# Patient Record
Sex: Female | Born: 1975 | Race: White | Hispanic: No | Marital: Married | Smoking: Former smoker
Health system: Southern US, Community
[De-identification: ages and names within clinical notes are randomized; demographics above are authoritative.]

## PROBLEM LIST (undated history)

## (undated) DIAGNOSIS — IMO0001 Reserved for inherently not codable concepts without codable children: Secondary | ICD-10-CM

## (undated) DIAGNOSIS — N888 Other specified noninflammatory disorders of cervix uteri: Secondary | ICD-10-CM

## (undated) DIAGNOSIS — K5792 Diverticulitis of intestine, part unspecified, without perforation or abscess without bleeding: Secondary | ICD-10-CM

## (undated) DIAGNOSIS — R160 Hepatomegaly, not elsewhere classified: Secondary | ICD-10-CM

## (undated) DIAGNOSIS — K3184 Gastroparesis: Secondary | ICD-10-CM

## (undated) DIAGNOSIS — T7840XA Allergy, unspecified, initial encounter: Secondary | ICD-10-CM

## (undated) DIAGNOSIS — R569 Unspecified convulsions: Secondary | ICD-10-CM

## (undated) DIAGNOSIS — Z5189 Encounter for other specified aftercare: Secondary | ICD-10-CM

## (undated) DIAGNOSIS — K760 Fatty (change of) liver, not elsewhere classified: Secondary | ICD-10-CM

## (undated) DIAGNOSIS — Z8742 Personal history of other diseases of the female genital tract: Secondary | ICD-10-CM

## (undated) DIAGNOSIS — G8929 Other chronic pain: Secondary | ICD-10-CM

## (undated) DIAGNOSIS — M419 Scoliosis, unspecified: Secondary | ICD-10-CM

## (undated) DIAGNOSIS — F329 Major depressive disorder, single episode, unspecified: Secondary | ICD-10-CM

## (undated) DIAGNOSIS — K449 Diaphragmatic hernia without obstruction or gangrene: Secondary | ICD-10-CM

## (undated) DIAGNOSIS — Z800 Family history of malignant neoplasm of digestive organs: Secondary | ICD-10-CM

## (undated) DIAGNOSIS — G4730 Sleep apnea, unspecified: Secondary | ICD-10-CM

## (undated) DIAGNOSIS — K219 Gastro-esophageal reflux disease without esophagitis: Secondary | ICD-10-CM

## (undated) DIAGNOSIS — F419 Anxiety disorder, unspecified: Secondary | ICD-10-CM

## (undated) HISTORY — DX: Allergy, unspecified, initial encounter: T78.40XA

## (undated) HISTORY — DX: Unspecified convulsions: R56.9

## (undated) HISTORY — PX: INNER EAR SURGERY: SHX679

## (undated) HISTORY — PX: WISDOM TOOTH EXTRACTION: SHX21

## (undated) HISTORY — DX: Gastroparesis: K31.84

## (undated) HISTORY — PX: TONSILLECTOMY: SUR1361

## (undated) HISTORY — PX: TUBAL LIGATION: SHX77

## (undated) HISTORY — DX: Sleep apnea, unspecified: G47.30

## (undated) HISTORY — DX: Reserved for inherently not codable concepts without codable children: IMO0001

## (undated) HISTORY — DX: Family history of malignant neoplasm of digestive organs: Z80.0

## (undated) HISTORY — DX: Encounter for other specified aftercare: Z51.89

## (undated) HISTORY — DX: Scoliosis, unspecified: M41.9

## (undated) HISTORY — DX: Fatty (change of) liver, not elsewhere classified: K76.0

## (undated) HISTORY — PX: COLONOSCOPY: SHX174

## (undated) HISTORY — DX: Personal history of other diseases of the female genital tract: Z87.42

## (undated) HISTORY — DX: Hepatomegaly, not elsewhere classified: R16.0

## (undated) HISTORY — PX: KNEE SURGERY: SHX244

## (undated) HISTORY — PX: OTHER SURGICAL HISTORY: SHX169

## (undated) HISTORY — DX: Diaphragmatic hernia without obstruction or gangrene: K44.9

## (undated) SURGERY — Surgical Case
Anesthesia: *Unknown

## (undated) NOTE — Telephone Encounter (Signed)
Formatting of this note might be different from the original.  [facility] Requested refill.   Electronically signed by May, Anita A, CMA at 08/06/2022 11:28 AM EST

---
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
## 1998-08-02 HISTORY — PX: WISDOM TOOTH EXTRACTION: SHX21

## 2005-06-02 ENCOUNTER — Other Ambulatory Visit: Admission: RE | Admit: 2005-06-02 | Discharge: 2005-06-02 | Payer: Self-pay | Admitting: Obstetrics and Gynecology

## 2005-11-29 ENCOUNTER — Inpatient Hospital Stay (HOSPITAL_COMMUNITY): Admission: AD | Admit: 2005-11-29 | Discharge: 2005-12-01 | Payer: Self-pay | Admitting: Obstetrics and Gynecology

## 2005-11-30 ENCOUNTER — Encounter (INDEPENDENT_AMBULATORY_CARE_PROVIDER_SITE_OTHER): Payer: Self-pay | Admitting: *Deleted

## 2006-08-02 HISTORY — PX: OTHER SURGICAL HISTORY: SHX169

## 2007-06-21 ENCOUNTER — Emergency Department (HOSPITAL_COMMUNITY): Admission: EM | Admit: 2007-06-21 | Discharge: 2007-06-22 | Payer: Self-pay | Admitting: Emergency Medicine

## 2008-10-30 ENCOUNTER — Emergency Department (HOSPITAL_COMMUNITY): Admission: EM | Admit: 2008-10-30 | Discharge: 2008-10-30 | Payer: Self-pay | Admitting: Emergency Medicine

## 2010-06-18 ENCOUNTER — Encounter: Admission: RE | Admit: 2010-06-18 | Discharge: 2010-06-18 | Payer: Self-pay | Admitting: Internal Medicine

## 2010-08-02 HISTORY — PX: CHOLECYSTECTOMY: SHX55

## 2010-08-17 LAB — COMPREHENSIVE METABOLIC PANEL
ALT: 36 U/L — ABNORMAL HIGH (ref 0–35)
AST: 29 U/L (ref 0–37)
Albumin: 3.7 g/dL (ref 3.5–5.2)
Alkaline Phosphatase: 57 U/L (ref 39–117)
BUN: 9 mg/dL (ref 6–23)
CO2: 27 mEq/L (ref 19–32)
Calcium: 9.2 mg/dL (ref 8.4–10.5)
Chloride: 105 mEq/L (ref 96–112)
Creatinine, Ser: 0.82 mg/dL (ref 0.4–1.2)
GFR calc Af Amer: >60 mL/min (ref >60)
GFR calc non Af Amer: >60 mL/min (ref >60)
Glucose, Bld: 84 mg/dL (ref 70–99)
Potassium: 4.1 mEq/L (ref 3.5–5.1)
Sodium: 140 mEq/L (ref 135–145)
Total Bilirubin: 0.6 mg/dL (ref 0.3–1.2)
Total Protein: 7.2 g/dL (ref 6.0–8.3)

## 2010-08-17 LAB — PREGNANCY, URINE: Preg Test, Ur: NEGATIVE

## 2010-08-17 LAB — SURGICAL PCR SCREEN
MRSA, PCR: NEGATIVE
Staphylococcus aureus: NEGATIVE

## 2010-08-20 ENCOUNTER — Ambulatory Visit (HOSPITAL_COMMUNITY)
Admission: RE | Admit: 2010-08-20 | Discharge: 2010-08-22 | Payer: Self-pay | Source: Home / Self Care | Attending: General Surgery | Admitting: General Surgery

## 2010-08-20 ENCOUNTER — Encounter (INDEPENDENT_AMBULATORY_CARE_PROVIDER_SITE_OTHER): Payer: Self-pay | Admitting: General Surgery

## 2010-08-21 NOTE — Op Note (Signed)
Meghan, Wilcox               ACCOUNT NO.:  [id number]  MEDICAL RECORD NO.:  [id number]          PATIENT TYPE:  AMB  LOCATION:  DAY                          FACILITY:  [facility]  PHYSICIAN:  Mare Loan, MD      DATE OF BIRTH:  12-23-1975  DATE OF PROCEDURE:  08/20/2010 DATE OF DISCHARGE:                              OPERATIVE REPORT   PREOPERATIVE DIAGNOSES: 1. Biliary colic. 2. Family history of liver disease.  POSTOPERATIVE DIAGNOSIS:  Biliary colic.  PROCEDURE:  Laparoscopic cholecystectomy, attempted cholangiogram, liver biopsy.  SURGEON:  Festus Pursel L. Zenia Resides, M.D.  ASSISTANT:  OR was assisting.  ANESTHESIA:  General endotracheal anesthesia.  SPECIMENS:  Gallbladder.  ESTIMATED BLOOD LOSS:  Minimal.  No immediate complications.  INDICATIONS FOR PROCEDURE:  Meghan Wilcox is a 35 year old female who has several months' duration of abdominal discomfort and pain.  She had increased abdominal distention.  She had an ultrasound which revealed stones within the gallbladder.  She had slight elevation of her liver enzymes, generally 57 of ALT, AST was normal, bilirubin is normal.  Her mother had mesh and her father had liver disease as well.  I believe she had biliary colic and thought she would benefit from cholecystectomy. Due to the question of liver disease within the family, we talked about doing a liver biopsy as well as cholangiogram.  Informed consent was obtained.  She received 2 grams of cefoxitin preoperatively.  DESCRIPTION OF PROCEDURE:  Meghan Wilcox was identified in the preoperative holding area.  All questions were answered.  History and physical was updated and she has been seen by Anesthesia, taken to the operating room.  Once in the operating room, placed in supine position.  After administration of general endotracheal anesthesia, sequential compression devices were applied to her lower extremity.  Abdomen was prepped and draped in the usual sterile fashion.   Surgical time-out was performed.  I began by placing the incision in the right upper quadrant using a 5-mm camera, placed the trocar in the abdominal cavity.  Using the Optiview, I was able to visualize all layers of abdominal wall.  I insufflated the abdomen, inspected the abdomen, found no evidence of injury on placement trocar.  I was just up above the liver.  I inspected down towards the umbilical region.  She had omental adhesions to the umbilical region and small umbilical hernia.  She had a previous scar in this vicinity.  I elected to go superiorly to this and placed a 11 mm trocar under visualization with camera, the patient was placed in reverse Trendelenburg for that position.  Two additional trocars were placed in the right upper quadrant.  The omentum was adhered to the gallbladder.  I was able to take this down with blunt dissection with electrocautery.  I grasped the fundus of the gallbladder, retracted this to head of the patient.  I grasped the infundibulum away from liver bed. There was a small hole in the gallbladder at the fundus and did lose some bile within the abdomen.  I had a critical view of the cystic duct and artery.  There was a small  hole at the infundibulum of the gallbladder, I was able to place a clip just at the distal to this at the infundibulum.  I then placed a cholangiogram catheter within the cystic duct.  This went without difficulty and secured the catheter and attempted to perform the cholangiogram.  There was extravasation of the contrast.  I therefore attempted to reposition the catheter.  Upon inspection in the catheter, had back wall of the cystic duct.  I removed the catheter and in attempting to reposition this, the cystic duct tore in half.  I attempted to reposition the catheter but did not have success.  I was able to dissect more the cystic duct and carefully using the [place] forceps as did not want to risk injuring the common duct and  I did not feel it was safe to try to place the catheter third time. I was able to place in the cystic duct but was unable to securely place the clip across and have to hold in place.  I therefore aborted the cholangiogram.  I placed 2 clips on the cystic duct and placed in a Endoloop across the cystic duct.  I then continued dissecting the gallbladder from the liver bed.  The tissues were very weak and I can dissect the gallbladder just with blunt dissection only.  Continued dissecting gallbladder, clipped the anterior and posterior branches of the cystic duct.  The gallbladder was placed in an EndoCatch bag.  I then irrigated the liver bed.  I found no bile spillage.  There was some mild oozing from the liver capsule around the gallbladder fossa.  There was a small injury to the capsule of the liver on the left lobe from the instrument.  I then performed a Tru-Cut biopsy of the liver.  Multiple passes were taken.  I used electrocautery to control small amount of oozing from the liver from the biopsies.  On placing the Tru-Cut through the abdominal wall, there was some bleeding noted from the abdominal wall.  I used a laparoscopic suture passer to close this and to gain hemostasis in this vicinity.  I then irrigated the abdomen with 3 L of saline, found no evidence of bleeding from the liver bed, no bile leakage.  She was only minimally oozing from the liver bed.  I placed a 19 Blake drain in the gallbladder bed.  I placed snare within the gallbladder fossa and placed Surgicel on the left lobe of the liver capsule injury.  After removing the gallbladder from the umbilical region and EndoCatch bag, I closed the fascial defect using laparoscopic suture passer.  I did not feel it was safe to close the umbilical hernia as it was due from a previous incision.  I therefore did one final inspection of the abdomen.  There was no leakage from the liver bed.  No evidence of injury in the abdomen.  I  then released pneumoperitoneum, removed the trocar.  Skin was closed with 4-0 Monocryl, 30 mL of local anesthetic was injected.  I secured the drain with a 3-0 nylon suture. The patient was extubated, transferred to postanesthesia care unit in stable condition.  I will keep her overnight.  Recheck liver enzymes tomorrow, likely recheck next week and remove her drain.     Mare Loan, MD     ALA/MEDQ  D:  08/20/2010  T:  08/20/2010  Job:  [id number]  cc:   Dr. Manuella Ghazi  Electronically Signed by Ronnald Collum MD on 08/21/2010 02:52:56 PM

## 2010-08-24 LAB — TYPE AND SCREEN
ABO/RH(D): O POS
Antibody Screen: NEGATIVE

## 2010-08-24 LAB — CBC
HCT: 39.3 % (ref 36.0–46.0)
Hemoglobin: 13.4 g/dL (ref 12.0–15.0)
MCH: 31.2 pg (ref 26.0–34.0)
MCHC: 34.1 g/dL (ref 30.0–36.0)
MCV: 91.6 fL (ref 78.0–100.0)
Platelets: 169 10*3/uL (ref 150–400)
RBC: 4.29 MIL/uL (ref 3.87–5.11)
RDW: 13.1 % (ref 11.5–15.5)
WBC: 10.4 10*3/uL (ref 4.0–10.5)

## 2010-08-24 LAB — COMPREHENSIVE METABOLIC PANEL
ALT: 82 U/L — ABNORMAL HIGH (ref 0–35)
AST: 81 U/L — ABNORMAL HIGH (ref 0–37)
Albumin: 3 g/dL — ABNORMAL LOW (ref 3.5–5.2)
Alkaline Phosphatase: 40 U/L (ref 39–117)
BUN: 9 mg/dL (ref 6–23)
CO2: 25 mEq/L (ref 19–32)
Calcium: 8.4 mg/dL (ref 8.4–10.5)
Chloride: 108 mEq/L (ref 96–112)
Creatinine, Ser: 0.77 mg/dL (ref 0.4–1.2)
GFR calc Af Amer: >60 mL/min (ref >60)
GFR calc non Af Amer: >60 mL/min (ref >60)
Glucose, Bld: 130 mg/dL — ABNORMAL HIGH (ref 70–99)
Potassium: 4.5 mEq/L (ref 3.5–5.1)
Sodium: 139 mEq/L (ref 135–145)
Total Bilirubin: 0.6 mg/dL (ref 0.3–1.2)
Total Protein: 5.9 g/dL — ABNORMAL LOW (ref 6.0–8.3)

## 2010-08-24 LAB — ABO/RH: ABO/RH(D): O POS

## 2010-09-17 NOTE — Discharge Summary (Addendum)
  NAMEKIMANH, TEMPLEMAN               ACCOUNT NO.:  [id number]  MEDICAL RECORD NO.:  [id number]          PATIENT TYPE:  OIB  LOCATION:  8127                         FACILITY:  [facility]  PHYSICIAN:  Mare Loan, MD      DATE OF BIRTH:  Oct 31, 1975  DATE OF ADMISSION:  08/20/2010 DATE OF DISCHARGE:  08/22/2010                              DISCHARGE SUMMARY   FINAL DIAGNOSIS:  Biliary colic.  PROCEDURE:  August 20, 2010, laparoscopic cholecystectomy and liver biopsy.  HOSPITAL COURSE:  Ms. Armenteros is a 35 year old female who underwent laparoscopic cholecystectomy.  I left a JP due to the drainage from the liver bed.  I wanted to make sure she did not have any bile leakage. Postoperatively, she had some issues with pain initially, and had to require IV narcotics.  She has been on oral medications and receiving Toradol,  has tolerated this well.  She has had stable blood pressure despite being in the 90s/60s.  Her heart had been in the 60s to 70s. She is making good urine, actually had 1150 odds in the last 24 hours, minimal at the JP only 10 mL.  I am going to have her follow up Monday to remove the JP.  She is encouraged to ambulate, do the incentive spirometry at home.  She is a smoker and but has not really had any shortness of breath.  Her incisions do not have any erythema, no drainage.  She is appropriately tender.  Abdomen is mildly distended.  FINAL DIAGNOSES:  Biliary colic and umbilical hernia, asymptomatic.  CONDITION UPON DISCHARGE:  Improved.     Mare Loan, MD     ALA/MEDQ  D:  08/22/2010  T:  08/22/2010  Job:  [id number]  Electronically Signed by Ronnald Collum MD on 09/17/2010 02:16:07 PM

## 2010-11-12 LAB — BASIC METABOLIC PANEL
BUN: 8 mg/dL (ref 6–23)
CO2: 21 mEq/L (ref 19–32)
Calcium: 8.5 mg/dL (ref 8.4–10.5)
Chloride: 103 mEq/L (ref 96–112)
Creatinine, Ser: 0.74 mg/dL (ref 0.4–1.2)
GFR calc Af Amer: >60 mL/min (ref >60)
GFR calc non Af Amer: >60 mL/min (ref >60)
Glucose, Bld: 103 mg/dL — ABNORMAL HIGH (ref 70–99)
Potassium: 3.6 mEq/L (ref 3.5–5.1)
Sodium: 132 mEq/L — ABNORMAL LOW (ref 135–145)

## 2010-11-12 LAB — CBC
HCT: 42.4 % (ref 36.0–46.0)
Hemoglobin: 14.8 g/dL (ref 12.0–15.0)
MCHC: 34.8 g/dL (ref 30.0–36.0)
MCV: 90.4 fL (ref 78.0–100.0)
Platelets: 119 10*3/uL — ABNORMAL LOW (ref 150–400)
RBC: 4.69 MIL/uL (ref 3.87–5.11)
RDW: 13.1 % (ref 11.5–15.5)
WBC: 5.7 10*3/uL (ref 4.0–10.5)

## 2010-11-12 LAB — DIFFERENTIAL
Basophils Absolute: 0 10*3/uL (ref 0.0–0.1)
Basophils Relative: 0 % (ref 0–1)
Eosinophils Absolute: 0 10*3/uL (ref 0.0–0.7)
Eosinophils Relative: 1 % (ref 0–5)
Lymphocytes Relative: 15 % (ref 12–46)
Lymphs Abs: 0.9 10*3/uL (ref 0.7–4.0)
Monocytes Absolute: 0.4 10*3/uL (ref 0.1–1.0)
Monocytes Relative: 8 % (ref 3–12)
Neutro Abs: 4.4 10*3/uL (ref 1.7–7.7)
Neutrophils Relative %: 76 % (ref 43–77)

## 2010-11-12 LAB — D-DIMER, QUANTITATIVE: D-Dimer, Quant: 1.05 ug/mL-FEU — ABNORMAL HIGH (ref 0.00–0.48)

## 2010-11-12 LAB — POCT CARDIAC MARKERS
CKMB, poc: <1 ng/mL — ABNORMAL LOW (ref 1.0–8.0)
Myoglobin, poc: 48 ng/mL (ref 12–200)
Troponin i, poc: <0.05 ng/mL (ref 0.00–0.09)

## 2010-11-18 ENCOUNTER — Emergency Department (HOSPITAL_COMMUNITY)
Admission: EM | Admit: 2010-11-18 | Discharge: 2010-11-19 | Disposition: A | Discharge status: Home / Self Care | Payer: Managed Care, Other (non HMO) | Attending: Emergency Medicine | Admitting: Emergency Medicine

## 2010-11-18 DIAGNOSIS — R109 Unspecified abdominal pain: Secondary | ICD-10-CM | POA: Insufficient documentation

## 2010-11-18 DIAGNOSIS — K7689 Other specified diseases of liver: Secondary | ICD-10-CM | POA: Insufficient documentation

## 2010-11-18 DIAGNOSIS — R110 Nausea: Secondary | ICD-10-CM | POA: Insufficient documentation

## 2010-11-18 LAB — URINALYSIS, ROUTINE W REFLEX MICROSCOPIC
Bilirubin Urine: NEGATIVE
Glucose, UA: NEGATIVE mg/dL
Hgb urine dipstick: NEGATIVE
Ketones, ur: NEGATIVE mg/dL
Nitrite: NEGATIVE
Protein, ur: NEGATIVE mg/dL
Specific Gravity, Urine: 1.02 (ref 1.005–1.030)
Urobilinogen, UA: 0.2 mg/dL (ref 0.0–1.0)
pH: 6 (ref 5.0–8.0)

## 2010-11-18 LAB — POCT PREGNANCY, URINE: Preg Test, Ur: NEGATIVE

## 2010-11-19 ENCOUNTER — Emergency Department (HOSPITAL_COMMUNITY): Payer: Managed Care, Other (non HMO)

## 2010-11-19 ENCOUNTER — Encounter (HOSPITAL_COMMUNITY): Payer: Self-pay

## 2010-11-19 LAB — COMPREHENSIVE METABOLIC PANEL
ALT: 30 U/L (ref 0–35)
AST: 21 U/L (ref 0–37)
Albumin: 3.6 g/dL (ref 3.5–5.2)
Alkaline Phosphatase: 65 U/L (ref 39–117)
BUN: 12 mg/dL (ref 6–23)
CO2: 25 mEq/L (ref 19–32)
Calcium: 9.1 mg/dL (ref 8.4–10.5)
Chloride: 103 mEq/L (ref 96–112)
Creatinine, Ser: 0.85 mg/dL (ref 0.4–1.2)
GFR calc Af Amer: >60 mL/min (ref >60)
GFR calc non Af Amer: >60 mL/min (ref >60)
Glucose, Bld: 96 mg/dL (ref 70–99)
Potassium: 3.9 mEq/L (ref 3.5–5.1)
Sodium: 137 mEq/L (ref 135–145)
Total Bilirubin: 0.7 mg/dL (ref 0.3–1.2)
Total Protein: 7.2 g/dL (ref 6.0–8.3)

## 2010-11-19 LAB — WET PREP, GENITAL
Clue Cells Wet Prep HPF POC: NONE SEEN
Trich, Wet Prep: NONE SEEN
Yeast Wet Prep HPF POC: NONE SEEN

## 2010-11-19 LAB — CBC
HCT: 43.9 % (ref 36.0–46.0)
Hemoglobin: 14.8 g/dL (ref 12.0–15.0)
MCH: 30.5 pg (ref 26.0–34.0)
MCHC: 33.7 g/dL (ref 30.0–36.0)
MCV: 90.3 fL (ref 78.0–100.0)
Platelets: 182 10*3/uL (ref 150–400)
RBC: 4.86 MIL/uL (ref 3.87–5.11)
RDW: 13.6 % (ref 11.5–15.5)
WBC: 13.9 10*3/uL — ABNORMAL HIGH (ref 4.0–10.5)

## 2010-11-19 LAB — GC/CHLAMYDIA PROBE AMP, GENITAL
Chlamydia, DNA Probe: NEGATIVE
GC Probe Amp, Genital: NEGATIVE

## 2010-11-19 LAB — DIFFERENTIAL
Basophils Absolute: 0 10*3/uL (ref 0.0–0.1)
Basophils Relative: 0 % (ref 0–1)
Eosinophils Absolute: 0.2 10*3/uL (ref 0.0–0.7)
Eosinophils Relative: 2 % (ref 0–5)
Lymphocytes Relative: 24 % (ref 12–46)
Lymphs Abs: 3.3 10*3/uL (ref 0.7–4.0)
Monocytes Absolute: 0.8 10*3/uL (ref 0.1–1.0)
Monocytes Relative: 6 % (ref 3–12)
Neutro Abs: 9.6 10*3/uL — ABNORMAL HIGH (ref 1.7–7.7)
Neutrophils Relative %: 69 % (ref 43–77)

## 2010-11-19 MED ORDER — IOHEXOL 300 MG/ML  SOLN
100.0000 mL | Freq: Once | INTRAMUSCULAR | Status: AC | PRN
  Administered 2010-11-19: 100 mL via INTRAVENOUS

## 2010-11-20 ENCOUNTER — Other Ambulatory Visit (HOSPITAL_COMMUNITY): Payer: Self-pay | Admitting: Obstetrics and Gynecology

## 2010-11-20 ENCOUNTER — Ambulatory Visit (HOSPITAL_COMMUNITY)
Admission: RE | Admit: 2010-11-20 | Discharge: 2010-11-20 | Disposition: A | Discharge status: Home / Self Care | Payer: Managed Care, Other (non HMO) | Source: Ambulatory Visit | Attending: Obstetrics and Gynecology | Admitting: Obstetrics and Gynecology

## 2010-11-20 DIAGNOSIS — N83209 Unspecified ovarian cyst, unspecified side: Secondary | ICD-10-CM | POA: Insufficient documentation

## 2010-11-20 DIAGNOSIS — N854 Malposition of uterus: Secondary | ICD-10-CM | POA: Insufficient documentation

## 2010-11-20 DIAGNOSIS — N949 Unspecified condition associated with female genital organs and menstrual cycle: Secondary | ICD-10-CM | POA: Insufficient documentation

## 2010-11-20 DIAGNOSIS — R1030 Lower abdominal pain, unspecified: Secondary | ICD-10-CM

## 2010-12-18 NOTE — Op Note (Signed)
NAME:  Wilcox, Meghan             ACCOUNT NO.:  [id number]   MEDICAL RECORD NO.:  [id number]          PATIENT TYPE:  INP   LOCATION:  9114                          FACILITY:  [place]   PHYSICIAN:  Blane Ohara Meisinger, M.D.DATE OF BIRTH:  [birth year]   DATE OF PROCEDURE:  11/30/2005  DATE OF DISCHARGE:                                 OPERATIVE REPORT   PREOPERATIVE DIAGNOSIS:  Postpartum and denies surgical sterility   POSTOPERATIVE DIAGNOSIS:  Postpartum and denies surgical sterility   PROCEDURE:  Postpartum Bilateral Partial Salpingectomy   SURGEON:  Clarene Duke, M.D.   ANESTHESIA:  Epidural and general endotracheal tube.   ESTIMATED BLOOD LOSS:  Minimal.   SPECIMENS:  Portions of bilateral fallopian tubes.   COMPLICATIONS:  None.   FINDINGS:  She had normal anatomy.   PROCEDURE IN DETAIL:  Patient was taken to the operating room and placed in  the dorsal supine position.  Initially, we attempted to dose her previously  placed epidural; however, she had no significant anesthesia on her right  side, so she then had general anesthesia induced.  Abdomen was prepped and  draped in the usual sterile fashion.  Infraumbilical skin was infiltrated  with 0.25% Marcaine and a 3 cm horizontal incision was made.  This was  carried down to the fascia, which was elevated and incised sharply.  Peritoneum was then entered bluntly.  A moist pack was used to pack omentum  out of the way.  Both fallopian tubes were identified and traced to their  fimbriated ends.  On each side, the middle portion of the fallopian tube was  elevated with a Babcock clamp.  A knuckle of tube on each side was ligated  with 0 plain gut suture.  This knuckle of tube was then removed sharply.  On  both sides, both tubal ostia were identified, and the stumps were  hemostatic.  Fascia was then closed with running 0 Vicryl after the moist  pack had been removed.  The skin was closed with running subcuticular suture  of 4-0 Vicryl rapide.  A sterile bandage was then applied.  The patient  tolerated the procedure well.  She was extubated in the operating room and  taken to the recovery room in stable condition after tolerating the  procedure well.  Counts were correct x2.      Clarene Duke, M.D.  Electronically Signed    TDM/MEDQ  D:  11/30/2005  T:  12/01/2005  Job:  [id number]

## 2010-12-18 NOTE — Discharge Summary (Signed)
NAME:  Wilcox, Meghan             ACCOUNT NO.:  [id number]   MEDICAL RECORD NO.:  [id number]          PATIENT TYPE:  INP   LOCATION:  9114                          FACILITY:  [place]   PHYSICIAN:  Clarene Duke, M.D.DATE OF BIRTH:  January 23, 1976   DATE OF ADMISSION:  11/29/2005  DATE OF DISCHARGE:  12/01/2005                                 DISCHARGE SUMMARY   ADMISSION DIAGNOSIS:  Intrauterine pregnancy at 39 weeks, group B strep  carrier, desires surgical sterility.   DISCHARGE DIAGNOSES:  Intrauterine pregnancy at 39 weeks, group B strep  carrier, desires surgical sterility.   PROCEDURES:  April 30, she had a spontaneous vaginal delivery and on May 1,  she had a postpartum bilateral partial salpingectomy.   HISTORY AND PHYSICAL:  This is a 35 year old white female gravida 5, para 2-  0-2-2 with an EGA of [redacted] weeks who presents for induction with a favorable  cervix.  Prenatal care was essentially uncomplicated with a recent  ultrasound on April 20 with resolution of possible placenta previa and a  possible LGA infant.  Prenatal labs reveals blood type is O+ with negative  antibody screen, RPR nonreactive, rubella immune, hepatitis B surface  antigen negative, gonorrhea and chlamydia negative, one hour Glucola 93 and  group B strep is positive.   PAST OB HISTORY:  In 1997 vaginal delivery at 40 weeks, 5 pounds 15 ounces,  no complications. 2000 vaginal delivery at 40 weeks, 7 pounds 3 ounces with  possible postpartum eclampsia and/or cardiomyopathy which resolved with a  normal follow up echocardiogram. She has had two spontaneous abortions with  D&Cs.   PAST MEDICAL HISTORY:  Gastroesophageal reflux disease, anxiety, depression  and scoliosis and has received a transfusion after surgery on her back.   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   CURRENT  MEDICATIONS:  Protonix.   PHYSICAL EXAM:  She is afebrile with stable vital signs.  Fetal heart  tracing reactive with occasional contractions.  Abdomen gravid, nontender  with an estimated fetal weight of 8 pounds.  Cervix is 3-4, 50, -2, vertex  presentation.   HOSPITAL COURSE:  The patient was admitted and started on Pitocin for  induction and penicillin for group B strep prophylaxis.  She progressed to 4  cm and I was able to perform amniotomy which revealed clear fluid.  She  eventually progressed in active labor and received an epidural.  She  progressed to complete, pushed well, and on the evening of April 30, had a  vaginal delivery of a viable female infant with Apgars 8 and 9 who weighed 7  pounds 15 ounces.  The placenta delivered spontaneously and was intact and  was sent for cord blood collection.  She had small superficial abrasions  which were hemostatic and not repaired.  Estimated blood loss was less than  500 mL.  She did desire to undergo tubal ligation.  Pre-delivery hemoglobin  was 10.4, post delivery 9.6.  On the afternoon of May  1, she did have a  tubal ligation performed under general anesthesia as her epidural was not  functional.  This was done without complications.  She had no further  problems with her stay.  On postpartum day two, she was stable for discharge  home.   DISCHARGE INSTRUCTIONS:  Regular diet, pelvic rest, follow-up in six weeks.  Medications are Percocet, #20, 1-2 p.o. q.4-6h. p.r.n. pain and over-the-  counter ibuprofen as needed.  She is also given our discharge pamphlet.      Clarene Duke, M.D.  Electronically Signed     TDM/MEDQ  D:  12/01/2005  T:  12/01/2005  Job:  [id number]

## 2011-01-07 ENCOUNTER — Other Ambulatory Visit (HOSPITAL_COMMUNITY): Payer: Self-pay | Admitting: Obstetrics and Gynecology

## 2011-01-07 DIAGNOSIS — N83209 Unspecified ovarian cyst, unspecified side: Secondary | ICD-10-CM

## 2011-01-14 ENCOUNTER — Ambulatory Visit (HOSPITAL_COMMUNITY)
Admission: RE | Admit: 2011-01-14 | Discharge: 2011-01-14 | Disposition: A | Discharge status: Home / Self Care | Payer: Managed Care, Other (non HMO) | Source: Ambulatory Visit | Attending: Obstetrics and Gynecology | Admitting: Obstetrics and Gynecology

## 2011-01-14 DIAGNOSIS — N83209 Unspecified ovarian cyst, unspecified side: Secondary | ICD-10-CM | POA: Insufficient documentation

## 2011-02-25 ENCOUNTER — Encounter: Payer: Self-pay | Admitting: Internal Medicine

## 2011-03-03 ENCOUNTER — Ambulatory Visit (INDEPENDENT_AMBULATORY_CARE_PROVIDER_SITE_OTHER): Payer: Managed Care, Other (non HMO) | Admitting: Internal Medicine

## 2011-03-03 ENCOUNTER — Encounter: Payer: Self-pay | Admitting: Internal Medicine

## 2011-03-03 DIAGNOSIS — E669 Obesity, unspecified: Secondary | ICD-10-CM

## 2011-03-03 DIAGNOSIS — R197 Diarrhea, unspecified: Secondary | ICD-10-CM

## 2011-03-03 DIAGNOSIS — K219 Gastro-esophageal reflux disease without esophagitis: Secondary | ICD-10-CM

## 2011-03-03 DIAGNOSIS — R112 Nausea with vomiting, unspecified: Secondary | ICD-10-CM

## 2011-03-03 DIAGNOSIS — K7689 Other specified diseases of liver: Secondary | ICD-10-CM

## 2011-03-03 MED ORDER — OMEPRAZOLE 20 MG PO CPDR: 20.0000 mg | DELAYED_RELEASE_CAPSULE | Freq: Two times a day (BID) | ORAL | Status: DC

## 2011-03-03 NOTE — Patient Instructions (Addendum)
Prescription sent to your pharmacy for Prilosec Reflux information/precautions given to you to read. GE Scan scheduled at [facility] for  03/12/11 1:00 pm arrive 12:45 nothing to eat or drink after midnight Stop Prilosec 24 hours prior to test. Follow-up with Dr. Henrene Pastor 4-6 weeks

## 2011-03-03 NOTE — Progress Notes (Signed)
HISTORY OF PRESENT ILLNESS:  Meghan Wilcox is a 35 y.o. female obesity, biopsy-proven NASH, and prior cholecystectomy. She presents today for evaluation of recurrent episodic nocturnal nausea with vomiting. Patient reports to me that she developed a "knot-like" discomfort in the left upper quadrant around October of 2011. Subsequently found to have gallstones on ultrasound, as well as changes consistent with fatty liver. Thereafter developed "attacks" that consisted of abdominal discomfort with nausea and vomiting. This was felt to represent symptomatic cholelithiasis, and the patient underwent laparoscopic cholecystectomy with intraoperative liver biopsy in January 2012. She was also noted to have a small umbilical hernia. Review of the operative report finds that attempts for cholangiogram were unsuccessful. Review of the pathology report shows chronic cholecystitis with cholelithiasis the liver biopsy revealed steatohepatitis with minimal fibrosis. She did have some problems with acute lower abdominal pain with radiation to the flanks in mid April 2012, for which she was seen in the emergency room. Review of records reveals a normal CBC except for white blood cell count of 13.9. Negative urinalysis. Normal comprehensive metabolic panel including liver function tests. CT scan of the abdomen and pelvis revealing prior cholecystectomy and fatty liver. Subsequent GYN evaluation with negative transvaginal ultrasound, except for left ovarian cyst. No problems until about one month ago she describes regurgitation and a choking sensation at night while sleeping. Subsequently begins to gag and develops nausea and vomiting, generally mucus and phlegm. No bleeding. Her weight has been stable. She takes Prevacid 20 mg around 8 PM each evening. Without medication, significant pyrosis. Some breakthrough symptoms on medication with dietary indiscretion. No true abdominal pain, though some intermittent "tightness". Bowel  habits are fairly regular with occasional loose stools attributed to dietary indiscretion.  REVIEW OF SYSTEMS:  All non-GI ROS negative except for sleeping problems  Past Medical History  Diagnosis Date  . Biliary colic   . Hepatomegaly   . Chronic cholecystitis   . Scoliosis   . History of ovarian cyst   . Family history of malignant neoplasm of gastrointestinal tract     Past Surgical History  Procedure Date  . Cholecystectomy jan 2012    Dr Ronnald Collum  . Wisdom tooth extraction   . Steal rod in her back age 51    [place]  reports that she has been smoking Cigarettes.  She has been smoking about .5 packs per day. She does not have any smokeless tobacco history on file. She reports that she does not drink alcohol or use illicit drugs.  family history includes Cirrhosis in her father and mother; Colon cancer in her paternal grandfather; Diabetes in her brother, father, and mother; Heart disease in her father and mother; Irritable bowel syndrome in her mother; and Kidney disease in her father and mother.  No Known Allergies     PHYSICAL EXAMINATION: Vital signs: BP 112/70  Pulse 80  Ht 5' 3"  (1.6 m)  Wt 215 lb 12.8 oz (97.886 kg)  BMI 38.23 kg/m2  Constitutional: generally well-appearing but obese, no acute distress Psychiatric: alert and oriented x3, cooperative Eyes: extraocular movements intact, anicteric, conjunctiva pink Mouth: oral pharynx moist, no lesions Neck: supple no lymphadenopathy Cardiovascular: heart regular rate and rhythm, no murmur Lungs: clear to auscultation bilaterally Abdomen: soft, obese, nontender, nondistended, no obvious ascites, no peritoneal signs, normal bowel sounds, no organomegaly Extremities: no lower extremity edema bilaterally Skin: no lesions on visible extremities Neuro: No focal deficits.    ASSESSMENT:  #1. Episodic nocturnal  episodes of regurgitation with nausea and vomiting. Most likely secondary to  reflux disease. #2. Status post cholecystectomy #3. 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 At risk for cirrhosis.   PLAN:  #1. Reflux precautions with attention to elevation of the head of the bed, smaller meals, and avoiding late-night meals. Literature provided #2. Weight loss to assist with occurred as well as fatty liver #3. Regular sensible exercise. I discussed the importance of exercise weight loss with regards to fatty liver. I also discussed the importance with regards to cirrhosis. She has a family history of liver disease and understands the severity of cirrhosis. Literature provided on fatty liver disease. #4. Increase omeprazole to 20 mg by mouth twice a day. Take 30 minutes before first meal and 30 minutes before evening meal. Prescription submitted electronically #5. Gastric emptying scan to rule out gastroparesis as a contributor to current symptoms #6. Routine GI followup in 4-6 weeks

## 2011-03-12 ENCOUNTER — Other Ambulatory Visit (HOSPITAL_COMMUNITY): Payer: Managed Care, Other (non HMO)

## 2011-03-23 ENCOUNTER — Encounter (HOSPITAL_COMMUNITY)
Admission: RE | Admit: 2011-03-23 | Discharge: 2011-03-23 | Disposition: A | Discharge status: Home / Self Care | Payer: Managed Care, Other (non HMO) | Source: Ambulatory Visit | Attending: Internal Medicine | Admitting: Internal Medicine

## 2011-03-23 DIAGNOSIS — K7689 Other specified diseases of liver: Secondary | ICD-10-CM

## 2011-03-23 DIAGNOSIS — R112 Nausea with vomiting, unspecified: Secondary | ICD-10-CM | POA: Insufficient documentation

## 2011-03-23 DIAGNOSIS — K219 Gastro-esophageal reflux disease without esophagitis: Secondary | ICD-10-CM

## 2011-03-23 DIAGNOSIS — R142 Eructation: Secondary | ICD-10-CM | POA: Insufficient documentation

## 2011-03-23 DIAGNOSIS — R933 Abnormal findings on diagnostic imaging of other parts of digestive tract: Secondary | ICD-10-CM | POA: Insufficient documentation

## 2011-03-23 DIAGNOSIS — R197 Diarrhea, unspecified: Secondary | ICD-10-CM

## 2011-03-23 DIAGNOSIS — R141 Gas pain: Secondary | ICD-10-CM | POA: Insufficient documentation

## 2011-03-23 DIAGNOSIS — R6881 Early satiety: Secondary | ICD-10-CM | POA: Insufficient documentation

## 2011-03-23 MED ORDER — TECHNETIUM TC 99M SULFUR COLLOID
2.1000 | Freq: Once | INTRAVENOUS | Status: AC | PRN
  Administered 2011-03-23: 2.1 via INTRAVENOUS

## 2011-03-24 ENCOUNTER — Telehealth: Payer: Self-pay

## 2011-03-24 NOTE — Telephone Encounter (Signed)
Pt aware.

## 2011-03-24 NOTE — Telephone Encounter (Signed)
Message copied by Faythe Casa on Wed Mar 24, 2011  8:46 AM ------      Message from: Irene Shipper      Created: Tue Mar 23, 2011 10:58 PM       Let pt know that GE scan does show some mild delay in emptying (which may explain some symptoms). Continue with plans as outlined at OV and keep plans for follow up

## 2011-04-06 ENCOUNTER — Encounter: Payer: Self-pay | Admitting: Internal Medicine

## 2011-04-06 ENCOUNTER — Ambulatory Visit (INDEPENDENT_AMBULATORY_CARE_PROVIDER_SITE_OTHER): Payer: Managed Care, Other (non HMO) | Admitting: Internal Medicine

## 2011-04-06 VITALS — BP 94/66 | HR 80 | Ht 64.5 in | Wt 215.0 lb

## 2011-04-06 DIAGNOSIS — R112 Nausea with vomiting, unspecified: Secondary | ICD-10-CM

## 2011-04-06 DIAGNOSIS — K219 Gastro-esophageal reflux disease without esophagitis: Secondary | ICD-10-CM

## 2011-04-06 NOTE — Progress Notes (Signed)
HISTORY OF PRESENT ILLNESS:  Meghan Wilcox is a 35 y.o. female with obesity, biopsy-proven NASH, and prior cholecystectomy. She was initially evaluated 03/03/2011 regarding recurrent episodic nocturnal nausea with vomiting. See that dictation. She was felt to have reflux disease. She was advised with regards to reflux precautions. Her omeprazole was increased to 20 mg twice a day. Gastric emptying scan was performed and revealed mild to moderate gastroparesis with 80% of tracer activity remaining at 1 hour and 51% of tracer activity remaining in 2 hours. She presents today for followup. Since her last visit she describes 3 episodes of nocturnal regurgitation while sleeping. This results in the need to expel gastric contents. Noted is food from earlier in the day. There is no antecedent nausea or pain preceding episodes of regurgitation/vomiting. Her weight is unchanged.  REVIEW OF SYSTEMS:  All non-GI ROS negative.  Past Medical History  Diagnosis Date  . Biliary colic   . Hepatomegaly   . Chronic cholecystitis   . Scoliosis   . History of ovarian cyst   . Family history of malignant neoplasm of gastrointestinal tract     Past Surgical History  Procedure Date  . Cholecystectomy jan 2012    Dr Ronnald Collum  . Wisdom tooth extraction   . Steal rod in her back age 60    [place]  reports that she has been smoking Cigarettes.  She has been smoking about .5 packs per day. She has never used smokeless tobacco. She reports that she does not drink alcohol or use illicit drugs.  family history includes Cirrhosis in her father and mother; Colon cancer in her paternal grandfather; Diabetes in her brother, father, and mother; Heart disease in her father and mother; Irritable bowel syndrome in her mother; and Kidney disease in her father and mother.  No Known Allergies     PHYSICAL EXAMINATION:  Vital signs: BP 94/66  Pulse 80  Ht 5' 4.5" (1.638 m)  Wt 215 lb (97.523  kg)  BMI 36.33 kg/m2 General: Well-developed, well-nourished, no acute distress HEENT: Sclerae are anicteric, conjunctiva pink. Oral mucosa intact Lungs: Clear Heart: Regular Abdomen: soft, nontender, nondistended, no obvious ascites, no peritoneal signs, normal bowel sounds. No organomegaly. No succussion splash Extremities: No edema Psychiatric: alert and oriented x3. Cooperative    ASSESSMENT:  #1. GERD with nocturnal regurgitation #2. Moderate gastroparesis, exacerbating #1   PLAN:  #1. Continue PPI #2. Again review the importance of reflux precautions. In particular, avoiding meals several hours before bedtime, elevation of the head of the bed, and weight loss #3. Upper endoscopy to exclude significant anatomic abnormalities #4. Did discuss the role of antireflux surgery to avoid regurgitation episodes. Patient is not interested at present. #5. Discussed a short trial of metoclopramide post-endoscopy. I did discuss potential, but serious side effects such as tardive dyskinesia. She would consider a short trial of therapy to see if this is helpful

## 2011-04-06 NOTE — Patient Instructions (Signed)
EGD [place] [facility] 04/07/11 3:30 pm arrive at 2:30 pm on 4th floor EGD Brochure given for you to review.

## 2011-04-07 ENCOUNTER — Encounter: Payer: Self-pay | Admitting: Internal Medicine

## 2011-04-07 ENCOUNTER — Ambulatory Visit (AMBULATORY_SURGERY_CENTER): Payer: Managed Care, Other (non HMO) | Admitting: Internal Medicine

## 2011-04-07 DIAGNOSIS — K219 Gastro-esophageal reflux disease without esophagitis: Secondary | ICD-10-CM

## 2011-04-07 DIAGNOSIS — R112 Nausea with vomiting, unspecified: Secondary | ICD-10-CM

## 2011-04-07 MED ORDER — OMEPRAZOLE 40 MG PO CPDR: 40.0000 mg | DELAYED_RELEASE_CAPSULE | Freq: Two times a day (BID) | ORAL | Status: DC

## 2011-04-07 MED ORDER — SODIUM CHLORIDE 0.9 % IV SOLN: 500.0000 mL | INTRAVENOUS | Status: DC

## 2011-04-07 MED ORDER — METOCLOPRAMIDE HCL 10 MG PO TABS: 10.0000 mg | ORAL_TABLET | Freq: Two times a day (BID) | ORAL | Status: DC

## 2011-04-07 NOTE — Patient Instructions (Signed)
Discharge instructions given with verbal understanding.  Handouts on esophagitis,hiatal hernia and a soft diet given.  Resume medications.

## 2011-04-08 ENCOUNTER — Telehealth: Payer: Self-pay | Admitting: *Deleted

## 2011-04-08 NOTE — Telephone Encounter (Signed)
Left message on phone to call us if necessary.  Ask for 4th floor or leave msg with receptionist.

## 2011-05-11 LAB — URINE CULTURE: Colony Count: >=100000

## 2011-05-11 LAB — DIFFERENTIAL
Basophils Absolute: 0.1
Basophils Relative: 1
Eosinophils Absolute: 0 — ABNORMAL LOW
Eosinophils Relative: 1

## 2011-05-11 LAB — URINALYSIS, ROUTINE W REFLEX MICROSCOPIC
Hgb urine dipstick: NEGATIVE
Ketones, ur: 15 — AB
Protein, ur: 30 — AB
Urobilinogen, UA: 1

## 2011-05-11 LAB — BASIC METABOLIC PANEL
BUN: 11
Chloride: 102
Creatinine, Ser: 0.85
Glucose, Bld: 120 — ABNORMAL HIGH
Potassium: 3.6

## 2011-05-11 LAB — CBC
HCT: 44.3
MCV: 88.3
Platelets: 181
RDW: 13.1

## 2011-05-24 ENCOUNTER — Encounter: Payer: Self-pay | Admitting: Internal Medicine

## 2011-05-27 ENCOUNTER — Encounter: Payer: Self-pay | Admitting: *Deleted

## 2011-05-28 ENCOUNTER — Ambulatory Visit (INDEPENDENT_AMBULATORY_CARE_PROVIDER_SITE_OTHER): Payer: Managed Care, Other (non HMO) | Admitting: Internal Medicine

## 2011-05-28 ENCOUNTER — Encounter: Payer: Self-pay | Admitting: Internal Medicine

## 2011-05-28 VITALS — BP 94/68 | HR 76 | Ht 64.0 in | Wt 212.6 lb

## 2011-05-28 DIAGNOSIS — K7689 Other specified diseases of liver: Secondary | ICD-10-CM

## 2011-05-28 DIAGNOSIS — K219 Gastro-esophageal reflux disease without esophagitis: Secondary | ICD-10-CM

## 2011-05-28 DIAGNOSIS — K3184 Gastroparesis: Secondary | ICD-10-CM

## 2011-05-28 DIAGNOSIS — K7581 Nonalcoholic steatohepatitis (NASH): Secondary | ICD-10-CM

## 2011-05-28 NOTE — Patient Instructions (Signed)
Please follow up with Dr Henrene Pastor in 3 months.

## 2011-05-28 NOTE — Progress Notes (Signed)
HISTORY OF PRESENT ILLNESS:  Meghan Wilcox is a 35 y.o. female with obesity, biopsy-proven NASH, and prior cholecystectomy. She has been evaluated in recent months for episodic nocturnal nausea with vomiting secondary to GERD exacerbated by gastroparesis. Workup has included upper endoscopy and solid-phase gastric emptying scan. Her upper endoscopy was performed 04/07/2011. She was found to have distal esophagitis and a small hiatal hernia. She was advised with regards to reflux precautions, prescribed increased dose of omeprazole 40 mg twice a day, and initiated on metoclopramide 10 mg twice a day. She presents today for followup. She's please report no further episodes with nausea or vomiting. No classic reflux symptoms. She is eating smaller meals. She will develop some abdominal discomfort and bloating with larger meals. She is trying to lose weight. Still smoking. No appreciable medication side effects.  REVIEW OF SYSTEMS:  All non-GI ROS negative.  Past Medical History  Diagnosis Date  . Biliary colic   . Hepatomegaly   . Chronic cholecystitis   . Scoliosis   . History of ovarian cyst   . Family history of malignant neoplasm of gastrointestinal tract   . Blood transfusion   . Seizures   . Gastroparesis   . Esophagitis     Past Surgical History  Procedure Date  . Cholecystectomy jan 2012    Dr Ronnald Collum  . Wisdom tooth extraction   . Steal rod in her back age 69  . Knee surgery     LEFT  . Tonisllectomy   . Inner ear surgery   . Tubal ligation     Social History Meghan Wilcox  reports that she has been smoking Cigarettes.  She has been smoking about .5 packs per day. She has never used smokeless tobacco. She reports that she does not drink alcohol or use illicit drugs.  family history includes Cirrhosis in her father and mother; Colon cancer in her paternal grandfather; Crohn's disease in her mother; Diabetes in her brother, father, and mother; Heart disease in her  father and mother; Irritable bowel syndrome in her mother; and Liver disease in her father and mother.  No Known Allergies     PHYSICAL EXAMINATION: Vital signs: BP 94/68  Pulse 76  Ht 5' 4"  (1.626 m)  Wt 212 lb 9.6 oz (96.435 kg)  BMI 36.49 kg/m2 General: Well-developed, well-nourished, no acute distress HEENT: Sclerae are anicteric, conjunctiva pink. Oral mucosa intact Lungs: Clear Heart: Regular Abdomen: soft, obese, nontender, nondistended, no obvious ascites, no peritoneal signs, normal bowel sounds. No organomegaly. No succussion splash Extremities: No edema Psychiatric: alert and oriented x3. Cooperative    ASSESSMENT:  #1. GERD #2. Gastroparesis #3. Biopsy-proven NASH #4. Obesity #5. Status post cholecystectomy   PLAN:  #1. Continue strict adherence to reflux precautions with attention to weight loss. #2. Continue gastroparesis diet #3. Continue omeprazole 40 mg twice a day #4. Discussed pros and cons of long-term metoclopramide. Discussed uncommon but serious serious potential neurologic sequelae. She understands. We also discussed attempts to taper off this medication. She will try. #5. Distensible regular exercise #6. GI followup in 3 months

## 2011-09-15 ENCOUNTER — Encounter (HOSPITAL_COMMUNITY): Payer: Self-pay | Admitting: Pharmacist

## 2011-09-23 ENCOUNTER — Encounter (HOSPITAL_COMMUNITY): Payer: Self-pay

## 2011-09-23 ENCOUNTER — Encounter (HOSPITAL_COMMUNITY)
Admission: RE | Admit: 2011-09-23 | Discharge: 2011-09-23 | Disposition: A | Discharge status: Home / Self Care | Payer: Managed Care, Other (non HMO) | Source: Ambulatory Visit | Attending: Obstetrics and Gynecology | Admitting: Obstetrics and Gynecology

## 2011-09-23 HISTORY — DX: Gastro-esophageal reflux disease without esophagitis: K21.9

## 2011-09-23 LAB — SURGICAL PCR SCREEN
MRSA, PCR: NEGATIVE
Staphylococcus aureus: POSITIVE — AB

## 2011-09-23 LAB — CBC
Platelets: 179 10*3/uL (ref 150–400)
RDW: 13.5 % (ref 11.5–15.5)
WBC: 7.8 10*3/uL (ref 4.0–10.5)

## 2011-09-23 NOTE — Patient Instructions (Addendum)
19 Meghan Wilcox  09/23/2011   Your procedure is scheduled on:  10/01/11  Enter through the Main Entrance of [facility] at [place] up the phone at the desk and dial [phone].   Call this number if you have problems the morning of surgery: [phone]   Remember:   Do not eat food:After Midnight.  Do not drink clear liquids: After Midnight.  Take these medicines the morning of surgery with A SIP OF WATER: Prilosec   Do not wear jewelry, make-up or nail polish.  Do not wear lotions, powders, or perfumes. You may wear deodorant.  Do not shave 48 hours prior to surgery.  Do not bring valuables to the hospital.  Contacts, dentures or bridgework may not be worn into surgery.  Leave suitcase in the car. After surgery it may be brought to your room.  For patients admitted to the hospital, checkout time is 11:00 AM the day of discharge.   Patients discharged the day of surgery will not be allowed to drive home.  Name and phone number of your driver: NA  Special Instructions: CHG Shower Use Special Wash: 1/2 bottle night before surgery and 1/2 bottle morning of surgery.   Please read over the following fact sheets that you were given: MRSA Information

## 2011-09-30 MED ORDER — CEFAZOLIN SODIUM-DEXTROSE 2-3 GM-% IV SOLR
2.0000 g | INTRAVENOUS | Status: AC
  Administered 2011-10-01: 2 g via INTRAVENOUS
  Filled 2011-09-30: qty 50

## 2011-10-01 ENCOUNTER — Encounter (HOSPITAL_COMMUNITY): Payer: Self-pay | Admitting: Anesthesiology

## 2011-10-01 ENCOUNTER — Encounter (HOSPITAL_COMMUNITY): Payer: Self-pay | Admitting: *Deleted

## 2011-10-01 ENCOUNTER — Inpatient Hospital Stay (HOSPITAL_COMMUNITY)
Admission: AD | Admit: 2011-10-01 | Discharge: 2011-10-01 | Disposition: A | Discharge status: Home Health | Payer: Managed Care, Other (non HMO) | Source: Ambulatory Visit | Attending: Obstetrics and Gynecology | Admitting: Obstetrics and Gynecology

## 2011-10-01 ENCOUNTER — Ambulatory Visit (HOSPITAL_COMMUNITY): Payer: Managed Care, Other (non HMO) | Admitting: Anesthesiology

## 2011-10-01 ENCOUNTER — Encounter (HOSPITAL_COMMUNITY)
Admission: RE | Disposition: A | Discharge status: Home / Self Care | Payer: Self-pay | Source: Ambulatory Visit | Attending: Obstetrics and Gynecology

## 2011-10-01 ENCOUNTER — Ambulatory Visit (HOSPITAL_COMMUNITY)
Admission: RE | Admit: 2011-10-01 | Discharge: 2011-10-01 | Disposition: A | Discharge status: Home / Self Care | Payer: Managed Care, Other (non HMO) | Source: Ambulatory Visit | Attending: Obstetrics and Gynecology | Admitting: Obstetrics and Gynecology

## 2011-10-01 DIAGNOSIS — R109 Unspecified abdominal pain: Secondary | ICD-10-CM | POA: Insufficient documentation

## 2011-10-01 DIAGNOSIS — R102 Pelvic and perineal pain: Secondary | ICD-10-CM | POA: Diagnosis present

## 2011-10-01 DIAGNOSIS — N949 Unspecified condition associated with female genital organs and menstrual cycle: Secondary | ICD-10-CM | POA: Insufficient documentation

## 2011-10-01 DIAGNOSIS — R339 Retention of urine, unspecified: Secondary | ICD-10-CM | POA: Insufficient documentation

## 2011-10-01 DIAGNOSIS — N393 Stress incontinence (female) (male): Secondary | ICD-10-CM | POA: Insufficient documentation

## 2011-10-01 DIAGNOSIS — IMO0002 Reserved for concepts with insufficient information to code with codable children: Secondary | ICD-10-CM | POA: Insufficient documentation

## 2011-10-01 DIAGNOSIS — Z01812 Encounter for preprocedural laboratory examination: Secondary | ICD-10-CM | POA: Insufficient documentation

## 2011-10-01 DIAGNOSIS — N803 Endometriosis of pelvic peritoneum, unspecified: Secondary | ICD-10-CM | POA: Insufficient documentation

## 2011-10-01 DIAGNOSIS — Z01818 Encounter for other preprocedural examination: Secondary | ICD-10-CM | POA: Insufficient documentation

## 2011-10-01 HISTORY — PX: LAPAROSCOPY: SHX197

## 2011-10-01 HISTORY — PX: BLADDER SUSPENSION: SHX72

## 2011-10-01 HISTORY — PX: CYSTOSCOPY: SHX5120

## 2011-10-01 LAB — PREGNANCY, URINE: Preg Test, Ur: NEGATIVE

## 2011-10-01 SURGERY — TRANSVAGINAL TAPE (TVT) PROCEDURE
Anesthesia: General | Site: Vagina | Wound class: Clean Contaminated

## 2011-10-01 MED ORDER — ESTRADIOL 0.1 MG/GM VA CREA
TOPICAL_CREAM | VAGINAL | Status: AC
  Filled 2011-10-01: qty 42.5

## 2011-10-01 MED ORDER — ROCURONIUM BROMIDE 100 MG/10ML IV SOLN
INTRAVENOUS | Status: DC | PRN
  Administered 2011-10-01: 50 mg via INTRAVENOUS

## 2011-10-01 MED ORDER — LIDOCAINE HCL (CARDIAC) 20 MG/ML IV SOLN
INTRAVENOUS | Status: AC
  Filled 2011-10-01: qty 5

## 2011-10-01 MED ORDER — MIDAZOLAM HCL 2 MG/2ML IJ SOLN
INTRAMUSCULAR | Status: AC
  Filled 2011-10-01: qty 2

## 2011-10-01 MED ORDER — FENTANYL CITRATE 0.05 MG/ML IJ SOLN
INTRAMUSCULAR | Status: DC | PRN
  Administered 2011-10-01: 100 ug via INTRAVENOUS
  Administered 2011-10-01: 50 ug via INTRAVENOUS
  Administered 2011-10-01: 100 ug via INTRAVENOUS

## 2011-10-01 MED ORDER — SODIUM CHLORIDE 0.9 % IJ SOLN
INTRAMUSCULAR | Status: DC | PRN
  Administered 2011-10-01: 5 mL

## 2011-10-01 MED ORDER — MIDAZOLAM HCL 5 MG/5ML IJ SOLN
INTRAMUSCULAR | Status: DC | PRN
  Administered 2011-10-01: 2 mg via INTRAVENOUS

## 2011-10-01 MED ORDER — BUPIVACAINE-EPINEPHRINE 0.5% -1:200000 IJ SOLN
INTRAMUSCULAR | Status: DC | PRN
  Administered 2011-10-01: 10 mL

## 2011-10-01 MED ORDER — FENTANYL CITRATE 0.05 MG/ML IJ SOLN: 25.0000 ug | INTRAMUSCULAR | Status: DC | PRN

## 2011-10-01 MED ORDER — ONDANSETRON HCL 4 MG/2ML IJ SOLN
INTRAMUSCULAR | Status: DC | PRN
  Administered 2011-10-01: 4 mg via INTRAVENOUS

## 2011-10-01 MED ORDER — ONDANSETRON HCL 4 MG/2ML IJ SOLN
INTRAMUSCULAR | Status: AC
  Filled 2011-10-01: qty 2

## 2011-10-01 MED ORDER — PHENYLEPHRINE HCL 10 MG/ML IJ SOLN
INTRAMUSCULAR | Status: DC | PRN
  Administered 2011-10-01 (×2): 80 ug via INTRAVENOUS
  Administered 2011-10-01: 40 ug via INTRAVENOUS

## 2011-10-01 MED ORDER — GLYCOPYRROLATE 0.2 MG/ML IJ SOLN
INTRAMUSCULAR | Status: DC | PRN
  Administered 2011-10-01: .8 mg via INTRAVENOUS

## 2011-10-01 MED ORDER — STERILE WATER FOR IRRIGATION IR SOLN
Status: DC | PRN
  Administered 2011-10-01: 1000 mL

## 2011-10-01 MED ORDER — BUPIVACAINE-EPINEPHRINE (PF) 0.5% -1:200000 IJ SOLN
INTRAMUSCULAR | Status: AC
  Filled 2011-10-01: qty 10

## 2011-10-01 MED ORDER — LEVOFLOXACIN 500 MG PO TABS: 500.0000 mg | ORAL_TABLET | Freq: Every day | ORAL | Status: AC

## 2011-10-01 MED ORDER — LACTATED RINGERS IV SOLN
INTRAVENOUS | Status: DC
  Administered 2011-10-01 (×2): via INTRAVENOUS

## 2011-10-01 MED ORDER — PROPOFOL 10 MG/ML IV EMUL
INTRAVENOUS | Status: DC | PRN
  Administered 2011-10-01: 20 mL via INTRAVENOUS

## 2011-10-01 MED ORDER — BUPIVACAINE HCL (PF) 0.25 % IJ SOLN
INTRAMUSCULAR | Status: DC | PRN
  Administered 2011-10-01: 8 mL

## 2011-10-01 MED ORDER — LACTATED RINGERS IV SOLN
INTRAVENOUS | Status: DC
  Administered 2011-10-01: 10:00:00 via INTRAVENOUS

## 2011-10-01 MED ORDER — DEXAMETHASONE SODIUM PHOSPHATE 10 MG/ML IJ SOLN
INTRAMUSCULAR | Status: AC
  Filled 2011-10-01: qty 1

## 2011-10-01 MED ORDER — LIDOCAINE HCL (CARDIAC) 20 MG/ML IV SOLN
INTRAVENOUS | Status: DC | PRN
  Administered 2011-10-01: 100 mg via INTRAVENOUS

## 2011-10-01 MED ORDER — BUPIVACAINE HCL (PF) 0.25 % IJ SOLN
INTRAMUSCULAR | Status: AC
  Filled 2011-10-01: qty 30

## 2011-10-01 MED ORDER — OXYCODONE-ACETAMINOPHEN 5-325 MG PO TABS
1.0000 | ORAL_TABLET | ORAL | Status: AC | PRN
Start: 2011-10-01 — End: 2011-10-11

## 2011-10-01 MED ORDER — NEOSTIGMINE METHYLSULFATE 1 MG/ML IJ SOLN
INTRAMUSCULAR | Status: DC | PRN
  Administered 2011-10-01: 4 mg via INTRAVENOUS

## 2011-10-01 MED ORDER — DEXAMETHASONE SODIUM PHOSPHATE 10 MG/ML IJ SOLN
INTRAMUSCULAR | Status: DC | PRN
  Administered 2011-10-01: 10 mg via INTRAVENOUS

## 2011-10-01 MED ORDER — NEOSTIGMINE METHYLSULFATE 1 MG/ML IJ SOLN
INTRAMUSCULAR | Status: AC
  Filled 2011-10-01: qty 10

## 2011-10-01 MED ORDER — KETOROLAC TROMETHAMINE 30 MG/ML IJ SOLN: 15.0000 mg | Freq: Once | INTRAMUSCULAR | Status: DC | PRN

## 2011-10-01 MED ORDER — GLYCOPYRROLATE 0.2 MG/ML IJ SOLN
INTRAMUSCULAR | Status: AC
  Filled 2011-10-01: qty 1

## 2011-10-01 MED ORDER — KETOROLAC TROMETHAMINE 30 MG/ML IJ SOLN
INTRAMUSCULAR | Status: DC | PRN
  Administered 2011-10-01: 30 mg via INTRAVENOUS

## 2011-10-01 MED ORDER — FENTANYL CITRATE 0.05 MG/ML IJ SOLN
INTRAMUSCULAR | Status: AC
  Filled 2011-10-01: qty 5

## 2011-10-01 SURGICAL SUPPLY — 42 items
BLADE SURG 15 STRL LF C SS BP (BLADE) ×3 IMPLANT
BLADE SURG 15 STRL SS (BLADE) ×1
CABLE HIGH FREQUENCY MONO STRZ (ELECTRODE) IMPLANT
CANISTER SUCTION 2500CC (MISCELLANEOUS) IMPLANT
CATH ROBINSON RED A/P 16FR (CATHETERS) ×8 IMPLANT
CHLORAPREP W/TINT 26ML (MISCELLANEOUS) ×4 IMPLANT
CLOTH BEACON ORANGE TIMEOUT ST (SAFETY) ×4 IMPLANT
DECANTER SPIKE VIAL GLASS SM (MISCELLANEOUS) ×8 IMPLANT
DERMABOND ADVANCED (GAUZE/BANDAGES/DRESSINGS) ×1
DERMABOND ADVANCED .7 DNX12 (GAUZE/BANDAGES/DRESSINGS) ×3 IMPLANT
DRAPE HYSTEROSCOPY (DRAPE) IMPLANT
GAUZE PACKING 2X5 YD STERILE (GAUZE/BANDAGES/DRESSINGS) IMPLANT
GLOVE BIO SURGEON STRL SZ8 (GLOVE) ×4 IMPLANT
GLOVE ORTHO TXT STRL SZ7.5 (GLOVE) ×4 IMPLANT
GOWN PREVENTION PLUS LG XLONG (DISPOSABLE) ×12 IMPLANT
NEEDLE EPID 17G 5 ECHO TUOHY (NEEDLE) IMPLANT
NEEDLE HYPO 22GX1.5 SAFETY (NEEDLE) ×4 IMPLANT
NEEDLE INSUFFLATION 14GA 120MM (NEEDLE) ×4 IMPLANT
NS IRRIG 1000ML POUR BTL (IV SOLUTION) ×4 IMPLANT
PACK LAPAROSCOPY BASIN (CUSTOM PROCEDURE TRAY) ×4 IMPLANT
PACK VAGINAL WOMENS (CUSTOM PROCEDURE TRAY) IMPLANT
POUCH SPECIMEN RETRIEVAL 10MM (ENDOMECHANICALS) IMPLANT
PROTECTOR NERVE ULNAR (MISCELLANEOUS) ×4 IMPLANT
SCALPEL HARMONIC ACE (MISCELLANEOUS) IMPLANT
SET CYSTO W/LG BORE CLAMP LF (SET/KITS/TRAYS/PACK) ×4 IMPLANT
SET IRRIG TUBING LAPAROSCOPIC (IRRIGATION / IRRIGATOR) IMPLANT
SHEET LAVH (DRAPES) ×4 IMPLANT
SLEEVE Z-THREAD 5X100MM (TROCAR) ×4 IMPLANT
SLING SOLYX SYSTEM SIS BX (SLING) IMPLANT
SLING SOLYX SYSTEM SIS EA (Sling) ×4 IMPLANT
SOLUTION ELECTROLUBE (MISCELLANEOUS) IMPLANT
SUT VIC AB 2-0 CT1 (SUTURE) ×4 IMPLANT
SUT VIC AB 2-0 CT2 27 (SUTURE) ×4 IMPLANT
SUT VICRYL 0 UR6 27IN ABS (SUTURE) IMPLANT
SUT VICRYL 4-0 PS2 18IN ABS (SUTURE) ×4 IMPLANT
TOWEL OR 17X24 6PK STRL BLUE (TOWEL DISPOSABLE) ×8 IMPLANT
TRAY FOLEY BAG SILVER LF 14FR (CATHETERS) IMPLANT
TRAY FOLEY CATH 14FR (SET/KITS/TRAYS/PACK) IMPLANT
TROCAR Z-THREAD FIOS 11X100 BL (TROCAR) IMPLANT
TROCAR Z-THREAD FIOS 5X100MM (TROCAR) ×4 IMPLANT
WARMER LAPAROSCOPE (MISCELLANEOUS) ×4 IMPLANT
WATER STERILE IRR 1000ML POUR (IV SOLUTION) ×4 IMPLANT

## 2011-10-01 NOTE — Anesthesia Procedure Notes (Signed)
Procedure Name: Intubation Date/Time: 10/01/2011 10:45 AM Performed by: Mahlani Berninger MARIE Pre-anesthesia Checklist: Patient identified, Patient being monitored, Emergency Drugs available, Timeout performed and Suction available Patient Re-evaluated:Patient Re-evaluated prior to inductionOxygen Delivery Method: Circle system utilized Preoxygenation: Pre-oxygenation with 100% oxygen Intubation Type: IV induction Laryngoscope Size: Mac and 4 Grade View: Grade I Tube type: Oral Tube size: 7.0 mm Number of attempts: 1 Placement Confirmation: ETT inserted through vocal cords under direct vision,  breath sounds checked- equal and bilateral and positive ETCO2 Secured at: 22 cm Dental Injury: Teeth and Oropharynx as per pre-operative assessment

## 2011-10-01 NOTE — Op Note (Signed)
Preoperative diagnosis: Stress urinary incontinence and left pelvic pain and dyspareunia Postop diagnosis: Stress urinary incontinence left pelvic pain and dyspareunia, possible endometriosis Procedure: Laparoscopy with fulguration of probable endometriosis, distal right salpingectomy, Solyx sling Surgeon: Cheri Fowler M.D. Anesthesia: Gen. Endotracheal tube Findings:she had an essentially normal pelvis and lower abdomen. She had a normal appendix. Uterus was essentially normal. She had normal tubes with evidence of prior tubal ligation. There was one dark implant in the right posterior cul-de-sac and some clear implants and the left utero-ovarian fossa that could have been consistent with endometriosis. There were also some clear implants on the distal right fallopian tube. With cystoscopy the bladder appeared normal with no injury to the ureters bladder or urethra. Specimens: Distal right fallopian tube Estimated blood loss: Minimal Complications: None  Procedure in detail: Patient was taken to the operating room placed in the dorsosupine position. Left arm was tucked to her side and legs were placed in mobile stirrups. Abdomen perineum and vagina were then prepped and draped in the usual sterile fashion, bladder drained with a red rubber catheter, Hulka tenaculum applied to the cervix for uterine manipulation. Infraumbilical skin was then infiltrated with quarter percent Marcaine and a 1 cm horizontal incision was made in her previous scar. A veress needle was inserted into the peritoneal cavity and placement confirmed by the water drop test an opening pressure 5 mm mercury. CO2 was insufflated to a pressure of 14 mm mercury and the Veress needle was removed. A 5 mm trocar was introduced with direct visualization with laparoscope. A 5 mm port was then placed also on the left side under direct visualization. Inspection revealed the above-mentioned findings. The small areas of possible endometriosis  were fulgurated with bipolar cautery. The distal right fallopian tube was fulgurated with bipolar cautery and then removed sharply from the remaining tube. It was then removed through the 5 mm trochar without difficulty. No other source for her pain was identified. All sites were hemostatic. Gas was allowed to deflate from the abdomen and the trochars were removed. Skin incisions were closed with interrupted subcuticular sutures of 4-0 Vicryl followed by Dermabond.  Attention was turned vaginally. The legs were raised about halfway in her mobile stirrups. Bladder was again drained with minimal urine with a red Robinson catheter. The vaginal mucosa was grasped with 2 Allis clamps about 1 fingerbreadth from the urethral meatus. Local anesthesia was infiltrated with half percent Marcaine with epinephrine beneath the vaginal mucosa in the midline and bilaterally. A 1 cm vertical incision was then made and the edges were grasped with Allis clamps. Sharp dissection was then used to dissect beneath the vaginal mucosa to the pubic ramus bilaterally. This was done without difficulty. The Solyx sling was then passed first on the patient's right side submucosally and passed beyond the pubic ramus and into the obturator muscle. The sling was released. It was then placed on the left side also passed submucosally past the pubic ramus into the obturator muscle. Placement appeared to be fairly snug. Cystoscopy was then performed with a 70 cystoscope which revealed a normal bladder. Urine was seen to flow from each or ureteral orifice. No evidence of injury to the bladder or urethra was identified. The cystoscope was removed. A crede maneuver with the bladder full with 2-300 cc revealed no urine leakage. The Solyx sling was released on the left side. The vaginal incision was closed with running locking 2-0 Vicryl. Careful inspection was done prior to doing this to make sure  that there was no buttonholing of the vagina and there  was not. All instruments were then removed from the vagina. The bladder was drained and about 1-200 cc of fluid was left in the bladder. The patient was taken down from stirrups. She was awakened in the operating room and taken to the recovery in stable condition after tolerating the procedure well. Counts were correct, she received Ancef 2 g IV the beginning of the procedure, she had PAS hose on throughout the procedure.

## 2011-10-01 NOTE — H&P (Signed)
Meghan Wilcox is an 36 y.o. female. She has had increasing urine leakage with exercise, becoming a problem.  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  She is also having some dyspareunia and left pelvic pain.  Pertinent Gynecological History: Last pap: normal Date: 08-2011  OB History: G5, P3023, SVD at term x 3, SAB x 2   Menstrual History :No LMP recorded. Patient has had an ablation.    Past Medical History  Diagnosis Date  . Biliary colic   . Hepatomegaly   . Chronic cholecystitis   . Scoliosis   . History of ovarian cyst   . Family history of malignant neoplasm of gastrointestinal tract   . Gastroparesis   . Esophagitis   . Seizures     post part  eclampsia  23yr ago  . GERD (gastroesophageal reflux disease)   . Blood transfusion age [age]yr   Antibody scewwn neg. 08/20/10 prior to cholecystectomy  Shingles   Past Surgical History  Procedure Date  . Cholecystectomy jan 2012    Dr AmRonnald Collum. Wisdom tooth extraction   . Steal rod in her back age [id number]. Knee surgery     LEFT  . Tonisllectomy   . Inner ear surgery   . Tubal ligation   . Tonsillectomy   Novasure  Family History  Problem Relation Age of Onset  . Diabetes Mother   . Heart disease Mother   . Liver disease Mother   . Cirrhosis Mother   . Irritable bowel syndrome Mother   . Diabetes Father   . Heart disease Father   . Liver disease Father   . Cirrhosis Father   . Diabetes Brother   . Colon cancer Paternal Grandfather   . Crohn's disease Mother     Social History:  reports that she has been smoking Cigarettes.  She has been smoking about .5 packs per day. She has never used smokeless tobacco. She reports that she does not drink alcohol or use illicit drugs.  Allergies: No Known Allergies  No prescriptions prior to admission    Review of Systems  Respiratory: Negative.   Cardiovascular: Negative.   Gastrointestinal: Negative.   Genitourinary: Negative.     There were no vitals taken for this  visit. Physical Exam  Constitutional: She appears well-developed and well-nourished.  Neck: Neck supple. No thyromegaly present.  Cardiovascular: Normal rate, regular rhythm and normal heart sounds.   No murmur heard. Respiratory: Effort normal and breath sounds normal. No respiratory distress. She has no wheezes.  GI: Soft. She exhibits no distension and no mass. There is no tenderness.  Genitourinary: Vagina normal.       Uterus small, midplanar, nontender No adnexal mass    No results found for this or any previous visit (from the past 24 hour(s)).  No results found.  Assessment/Plan: SUI, left pelvic pain and dyspareunia.  All medical and surgical options have been discussed.  Solyx sling and laparoscopy procedure, risks, alternatives, chances of relieving symptoms, specific risks related to mesh sling, have all been discussed.  Will admit for Solyx sling and laparoscopy to evaluate for cause of pain.    Meghan Wilcox D 10/01/2011, 8:11 AM

## 2011-10-01 NOTE — Progress Notes (Signed)
Date of Initial H&P: 10-01-11  History reviewed, patient examined, no change in status, stable for surgery.

## 2011-10-01 NOTE — Anesthesia Preprocedure Evaluation (Signed)
Anesthesia Evaluation  Patient identified by MRN, date of birth, ID band Patient awake    Reviewed: Allergy & Precautions, H&P , NPO status , Patient's Chart, lab work & pertinent test results, reviewed documented beta blocker date and time   History of Anesthesia Complications Negative for: history of anesthetic complications  Airway Mallampati: I TM Distance: >3 FB Neck ROM: full    Dental  (+) Teeth Intact   Pulmonary Current Smoker (<1 ppd),  clear to auscultation  Pulmonary exam normal       Cardiovascular Exercise Tolerance: Good regular Normal    Neuro/Psych Negative Neurological ROS  Negative Psych ROS   GI/Hepatic Neg liver ROS, GERD-  ,Gastroparesis, esophagitis   Endo/Other  Negative Endocrine ROS  Renal/GU negative Renal ROS  Female GU complaint (SUI, pelvic pain)     Musculoskeletal   Abdominal   Peds  Hematology negative hematology ROS (+)   Anesthesia Other Findings   Reproductive/Obstetrics negative OB ROS                           Anesthesia Physical Anesthesia Plan  ASA: II  Anesthesia Plan: General ETT   Post-op Pain Management:    Induction:   Airway Management Planned:   Additional Equipment:   Intra-op Plan:   Post-operative Plan:   Informed Consent: I have reviewed the patients History and Physical, chart, labs and discussed the procedure including the risks, benefits and alternatives for the proposed anesthesia with the patient or authorized representative who has indicated his/her understanding and acceptance.   Dental Advisory Given  Plan Discussed with: CRNA and Surgeon  Anesthesia Plan Comments:         Anesthesia Quick Evaluation

## 2011-10-01 NOTE — Progress Notes (Signed)
Unable to void.  Bladder scan revealed 120 mL in bladder.  Dr Willis Modena notified.  Order received that patient must void something before being discharged.  IVF restarted and patient given water to drink.

## 2011-10-01 NOTE — Discharge Instructions (Signed)
Foley Catheter Care, Adult A soft, flexible tube (Foley catheter) has been placed in your bladder. This may be done to temporarily help with urine drainage after an operation or to relieve blockage from an enlarged prostate gland. HOME CARE INSTRUCTIONS  If you are going home with a Foley catheter in place, follow these instructions: Taking Care of the Catheter:  Keep the area where the catheter leaves your body clean.   Attach the catheter to the leg so there is no tension on the catheter.   Keep the drainage bag below the level of the bladder, but keep it OFF the floor.   Do not take long soaking baths. Your caregiver will give instructions about showering.   Wash your hands before touching ANYTHING related to the catheter or bag.   Using mild soap and warm water on a washcloth:   Clean the area closest to the catheter insertion site using a circular motion around the catheter.   Clean the catheter itself by wiping AWAY from the insertion site for several inches down the tube.   NEVER wipe upward as this could sweep bacteria up into the urethra (tube in your body that normally drains the bladder) and cause infection.  Taking Care of the Drainage Bags:  Two drainage bags will be taken home: a large overnight drainage bag, and a smaller leg bag which fits underneath clothing.   It is okay to wear the overnight bag at any time, but NEVER wear the smaller leg bag at night.   Keep the drainage bag well below the level of your bladder. This prevents backflow of urine into the bladder and allows the urine to drain freely.   Anchor the tubing to your leg to prevent pulling or tension on the catheter. Use tape or a leg strap provided by the hospital.   Empty the drainage bag when it is  to  full. Wash your hands before and after touching the bag.   Periodically check the tubing for kinks to make sure there is no pressure on the tubing which could restrict the flow of urine.  Changing  the Drainage Bags:  Cleanse both ends of the clean bag with alcohol before changing.   Pinch off the rubber catheter to avoid urine spillage during the disconnection.   Disconnect the dirty bag and connect the clean one.   Empty the dirty bag carefully to avoid a urine spill.   Attach the new bag to the leg with tape or a leg strap.  Cleaning the Drainage Bags:  Whenever a drainage bag is disconnected, it must be cleaned quickly so it is ready for the next use.   Wash the bag in warm, soapy water.   Rinse the bag thoroughly with warm water.   Soak the bag for 30 minutes in a solution of white vinegar and water (1 cup vinegar to 1 quart warm water).   Rinse with warm water.  SEEK MEDICAL CARE IF:   Some pain develops in the kidney (lower back) area.   The urine is cloudy or smells bad.   There is some blood in the urine.   The catheter becomes clogged and/or there is no urine drainage.  SEEK IMMEDIATE MEDICAL CARE IF:   You have moderate or severe pain in the kidney region.   You start to throw up (vomit).   Blood fills the tube.   Worsening belly (abdominal) pain develops.   You have a fever.  MAKE SURE YOU:  Understand these instructions.   Will watch your condition.   Will get help right away if you are not doing well or get worse.  Document Released: 07/19/2005 Document Revised: 03/31/2011 Document Reviewed: 01/13/2007 [facility] Patient Information 2012 [place].

## 2011-10-01 NOTE — Progress Notes (Signed)
Attempted to void again without success.  C/o pressure and strong sensation to void.  Bladder scan 300 mL.  Appears uncomfortable.  Dr Willis Modena called and voice mail left.  Awaiting callback.

## 2011-10-01 NOTE — Anesthesia Postprocedure Evaluation (Signed)
  Anesthesia Post-op Note  Patient: Meghan Wilcox  Procedure(s) Performed: Procedure(s) (LRB): TRANSVAGINAL TAPE (TVT) PROCEDURE (N/A) LAPAROSCOPY OPERATIVE (N/A) CYSTOSCOPY (N/A) Patient is awake and responsive. Pain and nausea are reasonably well controlled. Vital signs are stable and clinically acceptable. Oxygen saturation is clinically acceptable. There are no apparent anesthetic complications at this time. Patient is ready for discharge.

## 2011-10-01 NOTE — Transfer of Care (Signed)
Immediate Anesthesia Transfer of Care Note  Patient: Meghan Wilcox  Procedure(s) Performed: Procedure(s) (LRB): TRANSVAGINAL TAPE (TVT) PROCEDURE (N/A) LAPAROSCOPY OPERATIVE (N/A) CYSTOSCOPY (N/A)  Patient Location: PACU  Anesthesia Type: General  Level of Consciousness: awake and sedated  Airway & Oxygen Therapy: Patient Spontanous Breathing and Patient connected to nasal cannula oxygen  Post-op Assessment: Report given to PACU RN  Post vital signs: Reviewed and stable  Complications: No apparent anesthesia complications

## 2011-10-01 NOTE — Progress Notes (Signed)
Pt sitting in hallway in a w/hciar crying-states she had a sling procedure done outpt. Today by Dr Willis Modena and is unable to urinate and is in a lot of pain-brought back to MAU rm#2 and Dr Willis Modena called with orders received

## 2011-10-01 NOTE — Progress Notes (Signed)
Patient attempted to void without success.  States she feels a slight sensation to void.  Will continue IVF and try again later.

## 2011-10-01 NOTE — Progress Notes (Signed)
Orders received from Dr Willis Modena to I&O cath and d/c home.  To call MD if unable to void once home.

## 2011-10-01 NOTE — Progress Notes (Signed)
C/o pain in upper lip.  Noted to have small vertical crack to right side of upper lip.

## 2011-10-01 NOTE — Discharge Instructions (Signed)
Routine instructions for laparoscopy and for a sling  DISCHARGE INSTRUCTIONS: Laparoscopy  The following instructions have been prepared to help you care for yourself upon your return home today.  Wound care: Marland Kitchen Do not get the incision wet for the first 24 hours. The incision should be kept clean and dry. . The Band-Aids or dressings may be removed the day after surgery. . Should the incision become sore, red, and swollen after the first week, check with your doctor.  Personal hygiene: . Shower the day after your procedure.  Activity and limitations: . Do NOT drive or operate any equipment today. . Do NOT lift anything more than 15 pounds for 2-3 weeks after surgery. . Do NOT rest in bed all day. . Walking is encouraged. Walk each day, starting slowly with 5-minute walks 3 or 4 times a day. Slowly increase the length of your walks. . Walk up and down stairs slowly. . Do NOT do strenuous activities, such as golfing, playing tennis, bowling, running, biking, weight lifting, gardening, mowing, or vacuuming for 2-4 weeks. Ask your doctor when it is okay to start.  Diet: Eat a light meal as desired this evening. You may resume your usual diet tomorrow.  Return to work: This is dependent on the type of work you do. For the most part you can return to a desk job within a week of surgery. If you are more active at work, please discuss this with your doctor.  What to expect after your surgery: You may have a slight burning sensation when you urinate on the first day. You may have a very small amount of blood in the urine. Expect to have a small amount of vaginal discharge/light bleeding for 1-2 weeks. It is not unusual to have abdominal soreness and bruising for up to 2 weeks. You may be tired and need more rest for about 1 week. You may experience shoulder pain for 24-72 hours. Lying flat in bed may relieve it.  Call your doctor for any of the following: . Develop a fever of 100.4 or greater .  Inability to urinate 6 hours after discharge from hospital . Severe pain not relieved by pain medications . Persistent of heavy bleeding at incision site . Redness or swelling around incision site after a week . Increasing nausea or vomiting

## 2011-10-04 ENCOUNTER — Encounter (HOSPITAL_COMMUNITY): Payer: Self-pay | Admitting: Obstetrics and Gynecology

## 2011-10-07 ENCOUNTER — Other Ambulatory Visit: Payer: Self-pay | Admitting: Internal Medicine

## 2012-03-31 ENCOUNTER — Other Ambulatory Visit: Payer: Self-pay | Admitting: Internal Medicine

## 2012-03-31 ENCOUNTER — Other Ambulatory Visit: Payer: Self-pay

## 2012-03-31 MED ORDER — METOCLOPRAMIDE HCL 10 MG PO TABS: 10.0000 mg | ORAL_TABLET | Freq: Two times a day (BID) | ORAL | Status: DC

## 2012-03-31 NOTE — Telephone Encounter (Signed)
Message copied by Audrea Muscat on Fri Mar 31, 2012  2:27 PM ------      Message from: Irene Shipper      Created: Fri Mar 31, 2012  2:14 PM      Regarding: meds request       Ok to refill x 1. She is 7 months overdue for follow up. Needs OV before additional meds prescribed. Convert this to phone note please.      ----- Message -----         From: Audrea Muscat, CMA         Sent: 03/31/2012   1:23 PM           To: Irene Shipper, MD            Patient requesting a refill of Reglan.  Last filled 10-07-11, #60 x 2 refills.  Please advise or let me know if you need any more information.  Thanks!

## 2012-03-31 NOTE — Telephone Encounter (Signed)
Refilled Reglan #60 x 1 per Dr. Henrene Pastor.  Will call pt to let her know she needs an office visit for further refills

## 2012-03-31 NOTE — Telephone Encounter (Signed)
Told pt I had refilled her Reglan per Dr. Henrene Pastor but that she was overdue for an office visit and Dr. Henrene Pastor would not approve any more refills until he had seen her in the office.  Pt agreed

## 2013-04-16 ENCOUNTER — Encounter (HOSPITAL_COMMUNITY): Payer: Self-pay

## 2013-04-16 ENCOUNTER — Other Ambulatory Visit (HOSPITAL_COMMUNITY): Payer: Self-pay | Admitting: Internal Medicine

## 2013-04-16 ENCOUNTER — Ambulatory Visit (HOSPITAL_COMMUNITY)
Admission: RE | Admit: 2013-04-16 | Discharge: 2013-04-16 | Disposition: A | Discharge status: Home / Self Care | Payer: Managed Care, Other (non HMO) | Source: Ambulatory Visit | Attending: Internal Medicine | Admitting: Internal Medicine

## 2013-04-16 DIAGNOSIS — R0602 Shortness of breath: Secondary | ICD-10-CM | POA: Insufficient documentation

## 2013-04-16 DIAGNOSIS — M4120 Other idiopathic scoliosis, site unspecified: Secondary | ICD-10-CM | POA: Insufficient documentation

## 2013-04-16 MED ORDER — IOHEXOL 350 MG/ML SOLN
100.0000 mL | Freq: Once | INTRAVENOUS | Status: AC | PRN
  Administered 2013-04-16: 100 mL via INTRAVENOUS

## 2013-11-12 ENCOUNTER — Encounter (INDEPENDENT_AMBULATORY_CARE_PROVIDER_SITE_OTHER): Payer: Self-pay

## 2013-11-12 ENCOUNTER — Ambulatory Visit (INDEPENDENT_AMBULATORY_CARE_PROVIDER_SITE_OTHER): Payer: Managed Care, Other (non HMO) | Admitting: General Surgery

## 2013-11-12 ENCOUNTER — Encounter (INDEPENDENT_AMBULATORY_CARE_PROVIDER_SITE_OTHER): Payer: Self-pay | Admitting: General Surgery

## 2013-11-12 VITALS — BP 126/80 | HR 74 | Temp 97.2°F | Resp 14 | Ht 64.0 in | Wt 219.0 lb

## 2013-11-12 DIAGNOSIS — K439 Ventral hernia without obstruction or gangrene: Secondary | ICD-10-CM | POA: Insufficient documentation

## 2013-11-12 NOTE — Patient Instructions (Signed)
Plan for laparoscopic ventral hernia repair with mesh

## 2013-11-12 NOTE — Progress Notes (Signed)
Patient ID: Meghan Wilcox, female   DOB: [birth year], 38 y.o.   MRN: [id number]  Chief Complaint  Patient presents with  . Incisional Hernia    HPI Meghan Wilcox is a 38 y.o. female.  We're asked to see the patient in consultation by Dr. Marton Redwood to evaluate her for a ventral hernia. The patient is a 38 year old white female who has been having pain above her umbilicus for the last  3-4 months. She has noticed a bulge just above her umbilicus. This area has been tender if she pushes against anything. She denies any nausea or vomiting. She does have a history of a laparoscopic cholecystectomy at which time the surgeon did note an umbilical hernia from previous surgery but did not repair it.  HPI  Past Medical History  Diagnosis Date  . Biliary colic   . Hepatomegaly   . Chronic cholecystitis   . Scoliosis   . History of ovarian cyst   . Family history of malignant neoplasm of gastrointestinal tract   . Gastroparesis   . Esophagitis   . Seizures     post part  eclampsia  110yr ago  . GERD (gastroesophageal reflux disease)   . Blood transfusion age [age]yr   Antibody scewwn neg. 08/20/10 prior to cholecystectomy    Past Surgical History  Procedure Laterality Date  . Cholecystectomy  jan 2012    Dr AmRonnald Collum. Wisdom tooth extraction    . Steal rod in her back  age [id number]. Knee surgery      LEFT  . Tonisllectomy    . Inner ear surgery    . Tubal ligation    . Tonsillectomy    . Bladder suspension  10/01/2011    Procedure: TRANSVAGINAL TAPE (TVT) PROCEDURE;  Surgeon: ToClarene DukeMD;  [facility];  Service: Gynecology;  Laterality: N/A;  Solyx Sling  . Laparoscopy  10/01/2011    Procedure: LAPAROSCOPY OPERATIVE;  Surgeon: ToClarene DukeMD;  [facility];  Service: Gynecology;  Laterality: N/A;  removal of right fimbria, fulgeration of endometriosis  . Cystoscopy  10/01/2011    Procedure: CYSTOSCOPY;  Surgeon: ToClarene DukeMD;  [facility];  Service:  Gynecology;  Laterality: N/A;  . Scoliosis  1987    Family History  Problem Relation Age of Onset  . Diabetes Mother   . Heart disease Mother   . Liver disease Mother   . Cirrhosis Mother   . Irritable bowel syndrome Mother   . Diabetes Father   . Heart disease Father   . Liver disease Father   . Cirrhosis Father   . Diabetes Brother   . Colon cancer Paternal Grandfather   . Crohn's disease Mother     Social History History  Substance Use Topics  . Smoking status: Current Every Day Smoker -- 0.50 packs/day    Types: Cigarettes  . Smokeless tobacco: Never Used  . Alcohol Use: No    No Known Allergies  Current Outpatient Prescriptions  Medication Sig Dispense Refill  . Clobetasol Propionate (TEMOVATE) 0.05 % external spray Apply 1 spray topically 2 (two) times daily. Uses Clobex brand.      . Marland KitchenONTRAVE 8-90 MG TB12       . DULoxetine (CYMBALTA) 60 MG capsule       . ibuprofen (ADVIL,MOTRIN) 200 MG tablet Take 600-800 mg by mouth every 6 (six) hours as needed. For headache/pain.      . Marland Kitchenolpidem (  AMBIEN) 10 MG tablet Take 10 mg by mouth at bedtime as needed.        . ALPRAZolam (XANAX) 0.25 MG tablet Take 0.25 mg by mouth at bedtime as needed for anxiety.       No current facility-administered medications for this visit.    Review of Systems Review of Systems  Constitutional: Negative.   HENT: Negative.   Eyes: Negative.   Respiratory: Negative.   Cardiovascular: Negative.   Gastrointestinal: Positive for abdominal pain.  Endocrine: Negative.   Genitourinary: Negative.   Musculoskeletal: Negative.   Skin: Negative.   Allergic/Immunologic: Negative.   Neurological: Negative.   Hematological: Negative.   Psychiatric/Behavioral: Negative.     Blood pressure 126/80, pulse 74, temperature 97.2 F (36.2 C), resp. rate 14, height 5' 4"  (1.626 m), weight 219 lb (99.338 kg).  Physical Exam Physical Exam  Constitutional: She is oriented to person, place, and time.  She appears well-developed and well-nourished.  HENT:  Head: Normocephalic and atraumatic.  Eyes: Conjunctivae and EOM are normal. Pupils are equal, round, and reactive to light.  Neck: Normal range of motion. Neck supple.  Cardiovascular: Normal rate, regular rhythm and normal heart sounds.   Pulmonary/Chest: Effort normal and breath sounds normal.  Abdominal: Soft. Bowel sounds are normal.  There appears to be a slight bulge just above the umbilicus. I cannot palpate a fascial defect today.  Musculoskeletal: Normal range of motion.  Lymphadenopathy:    She has no cervical adenopathy.  Neurological: She is alert and oriented to person, place, and time.  Skin: Skin is warm and dry.  Psychiatric: She has a normal mood and affect. Her behavior is normal.    Data Reviewed As above  Assessment    The patient likely has a umbilical hernia secondary to her previous tubal ligation. I am concerned that she may have a second hernia just above this from her laparoscopic cholecystectomy just given where her pain and bulge seems to be. Because of the risk of incarceration and strangulation I think she would benefit from having the hernia fixed. She would also like to have this done. I have discussed with her in detail the risks and benefits of the operation as well as some of the technical aspects including the use of mesh and the risk of injury to the bile as well as risk of chronic pain and she understands and wishes to proceed     Plan    Plan for laparoscopic ventral hernia repair with mesh        Luella Cook III 11/12/2013, 3:23 PM

## 2013-12-10 ENCOUNTER — Inpatient Hospital Stay (HOSPITAL_COMMUNITY): Admission: RE | Admit: 2013-12-10 | Payer: Managed Care, Other (non HMO) | Source: Ambulatory Visit

## 2013-12-13 ENCOUNTER — Encounter (HOSPITAL_COMMUNITY): Admission: RE | Payer: Self-pay | Source: Ambulatory Visit

## 2013-12-13 ENCOUNTER — Ambulatory Visit (HOSPITAL_COMMUNITY)
Admission: RE | Admit: 2013-12-13 | Payer: Managed Care, Other (non HMO) | Source: Ambulatory Visit | Admitting: General Surgery

## 2013-12-13 SURGERY — REPAIR, HERNIA, VENTRAL, LAPAROSCOPIC
Anesthesia: General

## 2014-03-31 ENCOUNTER — Encounter (HOSPITAL_COMMUNITY): Payer: Self-pay | Admitting: Emergency Medicine

## 2014-03-31 ENCOUNTER — Emergency Department (HOSPITAL_COMMUNITY): Payer: Managed Care, Other (non HMO)

## 2014-03-31 ENCOUNTER — Observation Stay (HOSPITAL_COMMUNITY)
Admission: EM | Admit: 2014-03-31 | Discharge: 2014-04-01 | Disposition: A | Discharge status: Home / Self Care | Payer: Managed Care, Other (non HMO) | Attending: Cardiology | Admitting: Cardiology

## 2014-03-31 DIAGNOSIS — K219 Gastro-esophageal reflux disease without esophagitis: Secondary | ICD-10-CM | POA: Insufficient documentation

## 2014-03-31 DIAGNOSIS — K209 Esophagitis, unspecified without bleeding: Secondary | ICD-10-CM | POA: Insufficient documentation

## 2014-03-31 DIAGNOSIS — R079 Chest pain, unspecified: Secondary | ICD-10-CM | POA: Diagnosis not present

## 2014-03-31 DIAGNOSIS — G40909 Epilepsy, unspecified, not intractable, without status epilepticus: Secondary | ICD-10-CM | POA: Insufficient documentation

## 2014-03-31 DIAGNOSIS — K3184 Gastroparesis: Secondary | ICD-10-CM | POA: Insufficient documentation

## 2014-03-31 DIAGNOSIS — Z7982 Long term (current) use of aspirin: Secondary | ICD-10-CM | POA: Insufficient documentation

## 2014-03-31 DIAGNOSIS — F17200 Nicotine dependence, unspecified, uncomplicated: Secondary | ICD-10-CM | POA: Insufficient documentation

## 2014-03-31 DIAGNOSIS — R197 Diarrhea, unspecified: Secondary | ICD-10-CM | POA: Insufficient documentation

## 2014-03-31 DIAGNOSIS — Z79899 Other long term (current) drug therapy: Secondary | ICD-10-CM | POA: Diagnosis not present

## 2014-03-31 DIAGNOSIS — K811 Chronic cholecystitis: Secondary | ICD-10-CM | POA: Insufficient documentation

## 2014-03-31 DIAGNOSIS — R9389 Abnormal findings on diagnostic imaging of other specified body structures: Secondary | ICD-10-CM | POA: Diagnosis not present

## 2014-03-31 DIAGNOSIS — K8020 Calculus of gallbladder without cholecystitis without obstruction: Secondary | ICD-10-CM | POA: Insufficient documentation

## 2014-03-31 DIAGNOSIS — M546 Pain in thoracic spine: Secondary | ICD-10-CM | POA: Insufficient documentation

## 2014-03-31 DIAGNOSIS — M4120 Other idiopathic scoliosis, site unspecified: Secondary | ICD-10-CM | POA: Insufficient documentation

## 2014-03-31 LAB — BASIC METABOLIC PANEL
Anion gap: 16 — ABNORMAL HIGH (ref 5–15)
BUN: 6 mg/dL (ref 6–23)
CALCIUM: 8.8 mg/dL (ref 8.4–10.5)
CO2: 22 mEq/L (ref 19–32)
CREATININE: 0.79 mg/dL (ref 0.50–1.10)
Chloride: 100 mEq/L (ref 96–112)
GFR calc non Af Amer: >90 mL/min (ref >90)
Glucose, Bld: 113 mg/dL — ABNORMAL HIGH (ref 70–99)
Potassium: 4 mEq/L (ref 3.7–5.3)
Sodium: 138 mEq/L (ref 137–147)

## 2014-03-31 LAB — CBC
HEMATOCRIT: 45.4 % (ref 36.0–46.0)
Hemoglobin: 15.7 g/dL — ABNORMAL HIGH (ref 12.0–15.0)
MCH: 31.2 pg (ref 26.0–34.0)
MCHC: 34.6 g/dL (ref 30.0–36.0)
MCV: 90.3 fL (ref 78.0–100.0)
Platelets: 179 10*3/uL (ref 150–400)
RBC: 5.03 MIL/uL (ref 3.87–5.11)
RDW: 13.7 % (ref 11.5–15.5)
WBC: 9.2 10*3/uL (ref 4.0–10.5)

## 2014-03-31 LAB — I-STAT TROPONIN, ED: Troponin i, poc: 0 ng/mL (ref 0.00–0.08)

## 2014-03-31 LAB — PRO B NATRIURETIC PEPTIDE: Pro B Natriuretic peptide (BNP): 24.4 pg/mL (ref 0–125)

## 2014-03-31 LAB — D-DIMER, QUANTITATIVE: D-Dimer, Quant: 1.06 ug/mL-FEU — ABNORMAL HIGH (ref 0.00–0.48)

## 2014-03-31 MED ORDER — NITROGLYCERIN 0.4 MG SL SUBL: 0.4000 mg | SUBLINGUAL_TABLET | SUBLINGUAL | Status: DC | PRN

## 2014-03-31 MED ORDER — SODIUM CHLORIDE 0.9 % IV BOLUS (SEPSIS)
500.0000 mL | Freq: Once | INTRAVENOUS | Status: AC
  Administered 2014-03-31: 500 mL via INTRAVENOUS

## 2014-03-31 MED ORDER — PANTOPRAZOLE SODIUM 40 MG IV SOLR
40.0000 mg | Freq: Once | INTRAVENOUS | Status: AC
  Administered 2014-03-31: 40 mg via INTRAVENOUS
  Filled 2014-03-31: qty 40

## 2014-03-31 MED ORDER — HYDROMORPHONE HCL PF 1 MG/ML IJ SOLN
1.0000 mg | Freq: Once | INTRAMUSCULAR | Status: AC
  Administered 2014-03-31: 1 mg via INTRAVENOUS
  Filled 2014-03-31: qty 1

## 2014-03-31 MED ORDER — DIPHENHYDRAMINE HCL 50 MG/ML IJ SOLN
12.5000 mg | Freq: Once | INTRAMUSCULAR | Status: AC
  Administered 2014-03-31: 12.5 mg via INTRAVENOUS
  Filled 2014-03-31: qty 1

## 2014-03-31 MED ORDER — ASPIRIN 81 MG PO CHEW
81.0000 mg | CHEWABLE_TABLET | Freq: Once | ORAL | Status: AC
  Administered 2014-03-31: 81 mg via ORAL
  Filled 2014-03-31: qty 1

## 2014-03-31 MED ORDER — GI COCKTAIL ~~LOC~~
30.0000 mL | Freq: Once | ORAL | Status: AC
  Administered 2014-03-31: 30 mL via ORAL
  Filled 2014-03-31: qty 30

## 2014-03-31 MED ORDER — IOHEXOL 350 MG/ML SOLN
80.0000 mL | Freq: Once | INTRAVENOUS | Status: AC | PRN
  Administered 2014-03-31: 53 mL via INTRAVENOUS

## 2014-03-31 NOTE — ED Notes (Signed)
Cardiologist at bedside.  

## 2014-03-31 NOTE — H&P (Signed)
Meghan Wilcox is an 38 y.o. female.   Chief Complaint: Chest pain HPI: Patient is 38 year old female with past medical history significant for GERD, scoliosis, depression, morbid obesity, tobacco abuse and history of marijuana abuse in the past came to the ER complaining of retrosternal chest pain described as pressure grade 7/10 associated with mild shortness of breath since noon. States chest pain increases with deep breathing and movement. Denies any fever chills. Denies any cough. Denies any history of trauma or lifting any heavy objects. Patient was noted to have mildly elevated d-dimer subsequently underwent CT angiography with contrast which showed no evidence of acute pulmonary embolism or thoracic dissection but showed the coronary calcification of unknown significance. Patient denies any history of exertional chest pain. But does history of exertional dyspnea. Denies palpitation lightheadedness or syncope. Denies PND orthopnea leg swelling. Denies any cardiac workup in the past. EKG done in the ED showed normal sinus rhythm with early repolarization changes no acute ischemic changes are noted.  Past Medical History  Diagnosis Date  . Biliary colic   . Hepatomegaly   . Chronic cholecystitis   . Scoliosis   . History of ovarian cyst   . Family history of malignant neoplasm of gastrointestinal tract   . Gastroparesis   . Esophagitis   . Seizures     post part  eclampsia  31yr ago  . GERD (gastroesophageal reflux disease)   . Blood transfusion age [age]yr   Antibody scewwn neg. 08/20/10 prior to cholecystectomy    Past Surgical History  Procedure Laterality Date  . Cholecystectomy  jan 2012    Dr AmRonnald Collum. Wisdom tooth extraction    . Steal rod in her back  age [id number]. Knee surgery      LEFT  . Tonisllectomy    . Inner ear surgery    . Tubal ligation    . Tonsillectomy    . Bladder suspension  10/01/2011    Procedure: TRANSVAGINAL TAPE (TVT) PROCEDURE;  Surgeon: ToClarene DukeMD;  [facility];  Service: Gynecology;  Laterality: N/A;  Solyx Sling  . Laparoscopy  10/01/2011    Procedure: LAPAROSCOPY OPERATIVE;  Surgeon: ToClarene DukeMD;  [facility];  Service: Gynecology;  Laterality: N/A;  removal of right fimbria, fulgeration of endometriosis  . Cystoscopy  10/01/2011    Procedure: CYSTOSCOPY;  Surgeon: ToClarene DukeMD;  [facility];  Service: Gynecology;  Laterality: N/A;  . Scoliosis  1987    Family History  Problem Relation Age of Onset  . Diabetes Mother   . Heart disease Mother   . Liver disease Mother   . Cirrhosis Mother   . Irritable bowel syndrome Mother   . Diabetes Father   . Heart disease Father   . Liver disease Father   . Cirrhosis Father   . Diabetes Brother   . Colon cancer Paternal Grandfather   . Crohn's disease Mother    Social History:  reports that she has been smoking Cigarettes.  She has been smoking about 0.50 packs per day. She has never used smokeless tobacco. She reports that she does not drink alcohol or use illicit drugs.  Allergies: No Known Allergies   (Not in a hospital admission)  Results for orders placed during the hospital encounter of 03/31/14 (from the past 48 hour(s))  CBC     Status: Abnormal   Collection Time    03/31/14  7:40 PM  Result Value Ref Range   WBC 9.2  4.0 - 10.5 K/uL   RBC 5.03  3.87 - 5.11 MIL/uL   Hemoglobin 15.7 (*) 12.0 - 15.0 g/dL   HCT 45.4  36.0 - 46.0 %   MCV 90.3  78.0 - 100.0 fL   MCH 31.2  26.0 - 34.0 pg   MCHC 34.6  30.0 - 36.0 g/dL   RDW 13.7  11.5 - 15.5 %   Platelets 179  150 - 400 K/uL  BASIC METABOLIC PANEL     Status: Abnormal   Collection Time    03/31/14  7:40 PM      Result Value Ref Range   Sodium 138  137 - 147 mEq/L   Potassium 4.0  3.7 - 5.3 mEq/L   Chloride 100  96 - 112 mEq/L   CO2 22  19 - 32 mEq/L   Glucose, Bld 113 (*) 70 - 99 mg/dL   BUN 6  6 - 23 mg/dL   Creatinine, Ser 0.79  0.50 - 1.10 mg/dL   Calcium 8.8  8.4 -  10.5 mg/dL   GFR calc non Af Amer >90  >90 mL/min   GFR calc Af Amer >90  >90 mL/min   Comment: (NOTE)     The eGFR has been calculated using the CKD EPI equation.     This calculation has not been validated in all clinical situations.     eGFR's persistently <90 mL/min signify possible Chronic Kidney     Disease.   Anion gap 16 (*) 5 - 15  PRO B NATRIURETIC PEPTIDE     Status: None   Collection Time    03/31/14  7:40 PM      Result Value Ref Range   Pro B Natriuretic peptide (BNP) 24.4  0 - 125 pg/mL  D-DIMER, QUANTITATIVE     Status: Abnormal   Collection Time    03/31/14  7:40 PM      Result Value Ref Range   D-Dimer, Quant 1.06 (*) 0.00 - 0.48 ug/mL-FEU   Comment:            AT THE INHOUSE ESTABLISHED CUTOFF     VALUE OF 0.48 ug/mL FEU,     THIS ASSAY HAS BEEN DOCUMENTED     IN THE LITERATURE TO HAVE     A SENSITIVITY AND NEGATIVE     PREDICTIVE VALUE OF AT LEAST     98 TO 99%.  THE TEST RESULT     SHOULD BE CORRELATED WITH     AN ASSESSMENT OF THE CLINICAL     PROBABILITY OF DVT / VTE.  Randolm Idol, ED     Status: None   Collection Time    03/31/14  7:46 PM      Result Value Ref Range   Troponin i, poc 0.00  0.00 - 0.08 ng/mL   Comment 3            Comment: Due to the release kinetics of cTnI,     a negative result within the first hours     of the onset of symptoms does not rule out     myocardial infarction with certainty.     If myocardial infarction is still suspected,     repeat the test at appropriate intervals.   Dg Chest 2 View  03/31/2014   CLINICAL DATA:  CHEST PAIN  EXAM: CHEST - 2 VIEW  COMPARISON:  CT 04/16/2013 and earlier studies  FINDINGS: Linear  scarring or subsegmental atelectasis, left lower lobe. Right lung clear. Heart size normal. Heart size and mediastinal contours are within normal limits. No effusion.  No pneumothorax. Thoracolumbar left unilateral Harrington rod and laminar wires intact. Surgical clips in the right upper abdomen.   IMPRESSION: 1. Linear scarring or subsegmental atelectasis, left lower lobe. 2. Stable postoperative changes.   Electronically Signed   By: Arne Cleveland M.D.   On: 03/31/2014 20:49   Ct Angio Chest Pe W/cm &/or Wo Cm  03/31/2014   CLINICAL DATA:  sob  EXAM: CT ANGIOGRAPHY CHEST WITH CONTRAST  TECHNIQUE: Multidetector CT imaging of the chest was performed using the standard protocol during bolus administration of intravenous contrast. Multiplanar CT image reconstructions and MIPs were obtained to evaluate the vascular anatomy.  CONTRAST:  5m OMNIPAQUE IOHEXOL 350 MG/ML SOLN  COMPARISON:  04/16/2013  FINDINGS: Satisfactory opacification of pulmonary arteries noted, and there is no evidence of pulmonary emboli. Adequate contrast opacification of the thoracic aorta with no evidence of dissection, aneurysm, or stenosis. There is bovine variant brachiocephalic arch anatomy without proximal stenosis. Coronary calcification noted. No pleural or pericardial effusion. No hilar or mediastinal adenopathy. Thoracic extra rotoscoliosis with left-sided Harrington rod, distal extent not visualized, and multiple laminar wires . Solid-appearing fusion across posterior elements T3-T12. Linear scarring or platelike atelectasis in the lateral left lower lobe. Lungs are otherwise clear. Visualized portions of upper abdomen unremarkable.  Review of the MIP images confirms the above findings.  IMPRESSION: 1. Negative for acute PE or thoracic aortic dissection. 2. Atherosclerosis, including coronary artery disease. Please note that although the presence of coronary artery calcium documents the presence of coronary artery disease, the severity of this disease and any potential stenosis cannot be assessed on this non-gated CT examination. Assessment for potential risk factor modification, dietary therapy or pharmacologic therapy may be warranted, if clinically indicated. 3. Solid-appearing instrumented thoracic fusion.    Electronically Signed   By: DArne ClevelandM.D.   On: 03/31/2014 21:07    Review of Systems  Constitutional: Negative for fever and chills.  Eyes: Negative for blurred vision and double vision.  Respiratory: Positive for shortness of breath. Negative for cough, hemoptysis and sputum production.   Cardiovascular: Positive for chest pain. Negative for palpitations, orthopnea, claudication and leg swelling.  Gastrointestinal: Negative for nausea, vomiting and abdominal pain.  Genitourinary: Negative for dysuria.  Neurological: Negative for dizziness and headaches.    Blood pressure 89/59, pulse 93, temperature 99.3 F (37.4 C), temperature source Oral, resp. rate 32, height 5' 2"  (1.575 m), weight 98.204 kg (216 lb 8 oz), SpO2 98.00%. Physical Exam  Constitutional: She is oriented to person, place, and time.  HENT:  Head: Normocephalic and atraumatic.  Eyes: Conjunctivae are normal. Pupils are equal, round, and reactive to light. Left eye exhibits no discharge. No scleral icterus.  Neck: Normal range of motion. Neck supple. No JVD present. No tracheal deviation present. No thyromegaly present.  Cardiovascular: Normal rate and regular rhythm.  Exam reveals no gallop.   No murmur heard. Respiratory: Effort normal and breath sounds normal. No respiratory distress. She has no wheezes. She has no rales.  GI: Soft. Bowel sounds are normal. She exhibits distension. There is tenderness (Mild epigastric tenderness noted no guarding). There is no rebound and no guarding.  Musculoskeletal: She exhibits no edema and no tenderness.  Neurological: She is alert and oriented to person, place, and time.     Assessment/Plan Atypical chest pain/history of exertional dyspnea/coronary calcification  on CT angio. rule out coronary insufficiency GERD History of scoliosis Depression Morbid obesity Tobacco abuse History of marijuana abuse in the past Plan As per orders We'll schedule for nuclear stress  test in a.m. Clent Demark 03/31/2014, 10:57 PM

## 2014-03-31 NOTE — ED Provider Notes (Signed)
CSN: [id number]     Arrival date & time 03/31/14  1914 History   First MD Initiated Contact with Patient 03/31/14 1937     Chief Complaint  Patient presents with  . Chest Pain      HPI Patient presents with mid chest and upper back pain with shortness of breath and just today.  Pain is worse with breathing and movement.  Denies nausea diaphoresis or vomiting.  Has had some diarrhea.  No previous history of coronary artery disease. Past Medical History  Diagnosis Date  . Biliary colic   . Hepatomegaly   . Chronic cholecystitis   . Scoliosis   . History of ovarian cyst   . Family history of malignant neoplasm of gastrointestinal tract   . Gastroparesis   . Esophagitis   . Seizures     post part  eclampsia  35yr ago  . GERD (gastroesophageal reflux disease)   . Blood transfusion age [age]yr   Antibody scewwn neg. 08/20/10 prior to cholecystectomy   Past Surgical History  Procedure Laterality Date  . Cholecystectomy  jan 2012    Dr AmRonnald Collum. Wisdom tooth extraction    . Steal rod in her back  age [id number]. Knee surgery      LEFT  . Tonisllectomy    . Inner ear surgery    . Tubal ligation    . Tonsillectomy    . Bladder suspension  10/01/2011    Procedure: TRANSVAGINAL TAPE (TVT) PROCEDURE;  Surgeon: ToClarene DukeMD;  [facility];  Service: Gynecology;  Laterality: N/A;  Solyx Sling  . Laparoscopy  10/01/2011    Procedure: LAPAROSCOPY OPERATIVE;  Surgeon: ToClarene DukeMD;  [facility];  Service: Gynecology;  Laterality: N/A;  removal of right fimbria, fulgeration of endometriosis  . Cystoscopy  10/01/2011    Procedure: CYSTOSCOPY;  Surgeon: ToClarene DukeMD;  [facility];  Service: Gynecology;  Laterality: N/A;  . Scoliosis  1987   Family History  Problem Relation Age of Onset  . Diabetes Mother   . Heart disease Mother   . Liver disease Mother   . Cirrhosis Mother   . Irritable bowel syndrome Mother   . Diabetes Father   . Heart disease Father    . Liver disease Father   . Cirrhosis Father   . Diabetes Brother   . Colon cancer Paternal Grandfather   . Crohn's disease Mother    History  Substance Use Topics  . Smoking status: Current Every Day Smoker -- 0.50 packs/day    Types: Cigarettes  . Smokeless tobacco: Never Used  . Alcohol Use: No   OB History   Grav Para Term Preterm Abortions TAB SAB Ect Mult Living   5 3 3       3      Review of Systems  All other systems reviewed and are negative  Allergies  Review of patient's allergies indicates no known allergies.  Home Medications   Prior to Admission medications   Medication Sig Start Date End Date Taking? Authorizing Provider  ALPRAZolam (XDuanne Moron0.25 MG tablet Take 0.25 mg by mouth at bedtime as needed for anxiety.   Yes Historical Provider, MD  DULoxetine (CYMBALTA) 60 MG capsule Take 60 mg by mouth daily.  11/06/13  Yes Historical Provider, MD  ibuprofen (ADVIL,MOTRIN) 200 MG tablet Take 600-800 mg by mouth every 6 (six) hours as needed. For headache/pain.   Yes Historical Provider, MD  aspirin EC 81 MG EC tablet Take 1 tablet (81 mg total) by mouth daily. 04/01/14   Clent Demark, MD  pantoprazole (PROTONIX) 40 MG tablet Take 1 tablet (40 mg total) by mouth 2 (two) times daily. 04/01/14   Clent Demark, MD  zolpidem (AMBIEN) 10 MG tablet Take 0.5 tablets (5 mg total) by mouth at bedtime as needed. 04/01/14   Clent Demark, MD   BP 97/65  Pulse 84  Temp(Src) 98.6 F (37 C) (Oral)  Resp 17  Ht 5' 2"  (1.575 m)  Wt 217 lb 11.2 oz (98.748 kg)  BMI 39.81 kg/m2  SpO2 93% Physical Exam Physical Exam  Nursing note and vitals reviewed. Constitutional: She is oriented to person, place, and time. She appears well-developed and well-nourished. No distress.  HENT:  Head: Normocephalic and atraumatic.  Eyes: Pupils are equal, round, and reactive to light.  Neck: Normal range of motion.  Cardiovascular: Normal rate and intact distal pulses.   Pulmonary/Chest: No  respiratory distress.  Abdominal: Normal appearance. She exhibits no distension.  Musculoskeletal: Normal range of motion.  Neurological: She is alert and oriented to person, place, and time. No cranial nerve deficit.  Skin: Skin is warm and dry.  Erythematous rash noted on anterior neck. Psychiatric: She has a normal mood and affect. Her behavior is normal.   ED Course  Procedures (including critical care time)  Medications  gi cocktail (Maalox,Lidocaine,Donnatal) (30 mLs Oral Given 03/31/14 2030)  diphenhydrAMINE (BENADRYL) injection 12.5 mg (12.5 mg Intravenous Given 03/31/14 2017)  pantoprazole (PROTONIX) injection 40 mg (40 mg Intravenous Given 03/31/14 2017)  HYDROmorphone (DILAUDID) injection 1 mg (1 mg Intravenous Given 03/31/14 2025)  iohexol (OMNIPAQUE) 350 MG/ML injection 80 mL (53 mLs Intravenous Contrast Given 03/31/14 2050)  aspirin chewable tablet 81 mg (81 mg Oral Given 03/31/14 2156)  sodium chloride 0.9 % bolus 500 mL (500 mLs Intravenous New Bag/Given 03/31/14 2246)  regadenoson (LEXISCAN) injection SOLN 0.4 mg (0.4 mg Intravenous Given 04/01/14 1115)  technetium sestamibi generic (CARDIOLITE) injection 10 milli Curie (10 milli Curies Intravenous Contrast Given 04/01/14 0950)  technetium sestamibi generic (CARDIOLITE) injection 30 milli Curie (30 milli Curies Intravenous Contrast Given 04/01/14 1120)    Labs Review Labs Reviewed  CBC - Abnormal; Notable for the following:    Hemoglobin 15.7 (*)    All other components within normal limits  BASIC METABOLIC PANEL - Abnormal; Notable for the following:    Glucose, Bld 113 (*)    Anion gap 16 (*)    All other components within normal limits  D-DIMER, QUANTITATIVE - Abnormal; Notable for the following:    D-Dimer, Quant 1.06 (*)    All other components within normal limits  COMPREHENSIVE METABOLIC PANEL - Abnormal; Notable for the following:    Sodium 135 (*)    Glucose, Bld 104 (*)    Albumin 3.3 (*)    All other  components within normal limits  LIPID PANEL - Abnormal; Notable for the following:    HDL 33 (*)    All other components within normal limits  PRO B NATRIURETIC PEPTIDE  MAGNESIUM  TSH  TROPONIN I  TROPONIN I  TROPONIN I  HEMOGLOBIN A1C  CBC WITH DIFFERENTIAL  I-STAT TROPOININ, ED    Imaging Review No results found.   EKG Interpretation   Date/Time:  Sunday March 31 2014 19:23:17 EDT Ventricular Rate:  101 PR Interval:  120 QRS Duration: 74 QT Interval:  338 QTC Calculation: 438 R Axis:  57 Text Interpretation:  Sinus tachycardia Otherwise normal ECG Confirmed by  Rosalio Catterton  MD, Vontrell Pullman ([id number]) on 03/31/2014 7:48:23 PM     Discuss case with Dr. Forde Dandy who referred me to cardiology.  Dr. Terrence Dupont from cardiology consulted and will come see the patient MDM   Final diagnoses:  Chest pain, unspecified chest pain type  Abnormal radiographic examination        Dot Lanes, MD 04/03/14 2211

## 2014-03-31 NOTE — ED Notes (Signed)
Pt. reports mid chest and upper back pain with SOB onset yesterday , pain increase with movement , denies nausea or diaphoresis .

## 2014-03-31 NOTE — ED Notes (Signed)
Pt transported to radiology.

## 2014-04-01 ENCOUNTER — Observation Stay (HOSPITAL_COMMUNITY): Payer: Managed Care, Other (non HMO)

## 2014-04-01 ENCOUNTER — Encounter (HOSPITAL_COMMUNITY): Payer: Self-pay | Admitting: *Deleted

## 2014-04-01 LAB — CBC WITH DIFFERENTIAL/PLATELET
Basophils Absolute: 0 10*3/uL (ref 0.0–0.1)
Basophils Relative: 0 % (ref 0–1)
Eosinophils Absolute: 0.1 10*3/uL (ref 0.0–0.7)
Eosinophils Relative: 1 % (ref 0–5)
HCT: 41.9 % (ref 36.0–46.0)
HEMOGLOBIN: 14.3 g/dL (ref 12.0–15.0)
LYMPHS PCT: 24 % (ref 12–46)
Lymphs Abs: 2.1 10*3/uL (ref 0.7–4.0)
MCH: 31 pg (ref 26.0–34.0)
MCHC: 34.1 g/dL (ref 30.0–36.0)
MCV: 90.7 fL (ref 78.0–100.0)
MONO ABS: 1 10*3/uL (ref 0.1–1.0)
MONOS PCT: 12 % (ref 3–12)
NEUTROS ABS: 5.4 10*3/uL (ref 1.7–7.7)
Neutrophils Relative %: 63 % (ref 43–77)
Platelets: 164 10*3/uL (ref 150–400)
RBC: 4.62 MIL/uL (ref 3.87–5.11)
RDW: 13.7 % (ref 11.5–15.5)
WBC: 8.6 10*3/uL (ref 4.0–10.5)

## 2014-04-01 LAB — LIPID PANEL
Cholesterol: 150 mg/dL (ref 0–200)
HDL: 33 mg/dL — AB (ref >39)
LDL CALC: 88 mg/dL (ref 0–99)
TRIGLYCERIDES: 145 mg/dL (ref <150)
Total CHOL/HDL Ratio: 4.5 RATIO
VLDL: 29 mg/dL (ref 0–40)

## 2014-04-01 LAB — HEMOGLOBIN A1C
Hgb A1c MFr Bld: 5.6 % (ref <5.7)
MEAN PLASMA GLUCOSE: 114 mg/dL (ref <117)

## 2014-04-01 LAB — COMPREHENSIVE METABOLIC PANEL
ALK PHOS: 58 U/L (ref 39–117)
ALT: 26 U/L (ref 0–35)
ANION GAP: 14 (ref 5–15)
AST: 25 U/L (ref 0–37)
Albumin: 3.3 g/dL — ABNORMAL LOW (ref 3.5–5.2)
BUN: 6 mg/dL (ref 6–23)
CO2: 20 meq/L (ref 19–32)
Calcium: 8.4 mg/dL (ref 8.4–10.5)
Chloride: 101 mEq/L (ref 96–112)
Creatinine, Ser: 0.71 mg/dL (ref 0.50–1.10)
GLUCOSE: 104 mg/dL — AB (ref 70–99)
POTASSIUM: 3.8 meq/L (ref 3.7–5.3)
Sodium: 135 mEq/L — ABNORMAL LOW (ref 137–147)
TOTAL PROTEIN: 7.1 g/dL (ref 6.0–8.3)
Total Bilirubin: 0.5 mg/dL (ref 0.3–1.2)

## 2014-04-01 LAB — TROPONIN I
Troponin I: <0.3 ng/mL (ref <0.30)
Troponin I: <0.3 ng/mL (ref <0.30)

## 2014-04-01 LAB — TSH: TSH: 3.77 u[IU]/mL (ref 0.350–4.500)

## 2014-04-01 LAB — MAGNESIUM: Magnesium: 1.9 mg/dL (ref 1.5–2.5)

## 2014-04-01 MED ORDER — ALPRAZOLAM 0.25 MG PO TABS: 0.2500 mg | ORAL_TABLET | Freq: Two times a day (BID) | ORAL | Status: DC | PRN

## 2014-04-01 MED ORDER — DULOXETINE HCL 60 MG PO CPEP
60.0000 mg | ORAL_CAPSULE | Freq: Every day | ORAL | Status: DC
  Filled 2014-04-01 (×2): qty 1

## 2014-04-01 MED ORDER — ZOLPIDEM TARTRATE 10 MG PO TABS: 5.0000 mg | ORAL_TABLET | Freq: Every evening | ORAL | Status: DC | PRN

## 2014-04-01 MED ORDER — ONDANSETRON HCL 4 MG/2ML IJ SOLN
4.0000 mg | Freq: Four times a day (QID) | INTRAMUSCULAR | Status: DC | PRN
  Administered 2014-04-01: 4 mg via INTRAVENOUS
  Filled 2014-04-01: qty 2

## 2014-04-01 MED ORDER — TECHNETIUM TC 99M SESTAMIBI GENERIC - CARDIOLITE
10.0000 | Freq: Once | INTRAVENOUS | Status: AC | PRN
  Administered 2014-04-01: 10 via INTRAVENOUS

## 2014-04-01 MED ORDER — PANTOPRAZOLE SODIUM 40 MG PO TBEC: 40.0000 mg | DELAYED_RELEASE_TABLET | Freq: Two times a day (BID) | ORAL | Status: DC

## 2014-04-01 MED ORDER — ACETAMINOPHEN 325 MG PO TABS
650.0000 mg | ORAL_TABLET | ORAL | Status: DC | PRN
Start: 2014-04-01 — End: 2014-04-01

## 2014-04-01 MED ORDER — NITROGLYCERIN 0.4 MG SL SUBL: 0.4000 mg | SUBLINGUAL_TABLET | SUBLINGUAL | Status: DC | PRN

## 2014-04-01 MED ORDER — SODIUM CHLORIDE 0.9 % IV SOLN
INTRAVENOUS | Status: DC
  Administered 2014-04-01: via INTRAVENOUS

## 2014-04-01 MED ORDER — PANTOPRAZOLE SODIUM 40 MG PO TBEC
40.0000 mg | DELAYED_RELEASE_TABLET | Freq: Two times a day (BID) | ORAL | Status: DC
  Administered 2014-04-01 (×2): 40 mg via ORAL
  Filled 2014-04-01 (×2): qty 1

## 2014-04-01 MED ORDER — ATORVASTATIN CALCIUM 80 MG PO TABS
80.0000 mg | ORAL_TABLET | Freq: Every day | ORAL | Status: DC
  Filled 2014-04-01: qty 1

## 2014-04-01 MED ORDER — ASPIRIN 300 MG RE SUPP
300.0000 mg | RECTAL | Status: DC
Start: 2014-04-01 — End: 2014-04-01

## 2014-04-01 MED ORDER — ALPRAZOLAM 0.25 MG PO TABS: 0.2500 mg | ORAL_TABLET | Freq: Every evening | ORAL | Status: DC | PRN

## 2014-04-01 MED ORDER — DULOXETINE HCL 60 MG PO CPEP: 60.0000 mg | ORAL_CAPSULE | Freq: Every day | ORAL | Status: DC

## 2014-04-01 MED ORDER — HEPARIN SODIUM (PORCINE) 5000 UNIT/ML IJ SOLN
5000.0000 [IU] | Freq: Three times a day (TID) | INTRAMUSCULAR | Status: DC
  Filled 2014-04-01 (×3): qty 1

## 2014-04-01 MED ORDER — REGADENOSON 0.4 MG/5ML IV SOLN
INTRAVENOUS | Status: AC
  Administered 2014-04-01: 0.4 mg via INTRAVENOUS
  Filled 2014-04-01: qty 5

## 2014-04-01 MED ORDER — REGADENOSON 0.4 MG/5ML IV SOLN
0.4000 mg | Freq: Once | INTRAVENOUS | Status: AC
  Administered 2014-04-01: 0.4 mg via INTRAVENOUS
  Filled 2014-04-01: qty 5

## 2014-04-01 MED ORDER — ASPIRIN 81 MG PO CHEW: 324.0000 mg | CHEWABLE_TABLET | ORAL | Status: DC

## 2014-04-01 MED ORDER — OXYCODONE-ACETAMINOPHEN 5-325 MG PO TABS
1.0000 | ORAL_TABLET | Freq: Four times a day (QID) | ORAL | Status: DC | PRN
  Administered 2014-04-01: 2 via ORAL
  Filled 2014-04-01: qty 2

## 2014-04-01 MED ORDER — ASPIRIN 81 MG PO TBEC: 81.0000 mg | DELAYED_RELEASE_TABLET | Freq: Every day | ORAL | Status: DC

## 2014-04-01 MED ORDER — TECHNETIUM TC 99M SESTAMIBI GENERIC - CARDIOLITE
30.0000 | Freq: Once | INTRAVENOUS | Status: AC | PRN
  Administered 2014-04-01: 30 via INTRAVENOUS

## 2014-04-01 MED ORDER — ASPIRIN EC 81 MG PO TBEC
81.0000 mg | DELAYED_RELEASE_TABLET | Freq: Every day | ORAL | Status: DC
  Administered 2014-04-01: 81 mg via ORAL
  Filled 2014-04-01: qty 1

## 2014-04-01 NOTE — Discharge Instructions (Signed)

## 2014-04-01 NOTE — Discharge Summary (Signed)
  Discharge summary dictated on 04/01/2014 dictation number is [phone]

## 2014-04-01 NOTE — Progress Notes (Signed)
Subjective:  Complains of vague left-sided chest pain states overall feels little better. Cardiac enzymes have been negative schedule for nuclear stress test today  Objective:  Vital Signs in the last 24 hours: Temp:  [97.8 F (36.6 C)-99.3 F (37.4 C)] 98.2 F (36.8 C) (08/31 0500) Pulse Rate:  [74-104] 74 (08/31 0500) Resp:  [15-47] 16 (08/31 0500) BP: (89-116)/(47-87) 99/57 mmHg (08/31 0500) SpO2:  [91 %-98 %] 98 % (08/31 0500) Weight:  [98.204 kg (216 lb 8 oz)-98.748 kg (217 lb 11.2 oz)] 98.748 kg (217 lb 11.2 oz) (08/31 0012)  Intake/Output from previous day:   Intake/Output from this shift:    Physical Exam: Exam unchanged  Lab Results:  Recent Labs  03/31/14 1940 04/01/14 0031  WBC 9.2 8.6  HGB 15.7* 14.3  PLT 179 164    Recent Labs  03/31/14 1940 04/01/14 0031  NA 138 135*  K 4.0 3.8  CL 100 101  CO2 22 20  GLUCOSE 113* 104*  BUN 6 6  CREATININE 0.79 0.71    Recent Labs  04/01/14 0031 04/01/14 0615  TROPONINI <0.30 <0.30   Hepatic Function Panel  Recent Labs  04/01/14 0031  PROT 7.1  ALBUMIN 3.3*  AST 25  ALT 26  ALKPHOS 58  BILITOT 0.5    Recent Labs  04/01/14 0031  CHOL 150   No results found for this basename: PROTIME,  in the last 72 hours  Imaging: Imaging results have been reviewed and Dg Chest 2 View  03/31/2014   CLINICAL DATA:  CHEST PAIN  EXAM: CHEST - 2 VIEW  COMPARISON:  CT 04/16/2013 and earlier studies  FINDINGS: Linear scarring or subsegmental atelectasis, left lower lobe. Right lung clear. Heart size normal. Heart size and mediastinal contours are within normal limits. No effusion.  No pneumothorax. Thoracolumbar left unilateral Harrington rod and laminar wires intact. Surgical clips in the right upper abdomen.  IMPRESSION: 1. Linear scarring or subsegmental atelectasis, left lower lobe. 2. Stable postoperative changes.   Electronically Signed   By: Arne Cleveland M.D.   On: 03/31/2014 20:49   Ct Angio Chest Pe W/cm  &/or Wo Cm  03/31/2014   CLINICAL DATA:  sob  EXAM: CT ANGIOGRAPHY CHEST WITH CONTRAST  TECHNIQUE: Multidetector CT imaging of the chest was performed using the standard protocol during bolus administration of intravenous contrast. Multiplanar CT image reconstructions and MIPs were obtained to evaluate the vascular anatomy.  CONTRAST:  69m OMNIPAQUE IOHEXOL 350 MG/ML SOLN  COMPARISON:  04/16/2013  FINDINGS: Satisfactory opacification of pulmonary arteries noted, and there is no evidence of pulmonary emboli. Adequate contrast opacification of the thoracic aorta with no evidence of dissection, aneurysm, or stenosis. There is bovine variant brachiocephalic arch anatomy without proximal stenosis. Coronary calcification noted. No pleural or pericardial effusion. No hilar or mediastinal adenopathy. Thoracic extra rotoscoliosis with left-sided Harrington rod, distal extent not visualized, and multiple laminar wires . Solid-appearing fusion across posterior elements T3-T12. Linear scarring or platelike atelectasis in the lateral left lower lobe. Lungs are otherwise clear. Visualized portions of upper abdomen unremarkable.  Review of the MIP images confirms the above findings.  IMPRESSION: 1. Negative for acute PE or thoracic aortic dissection. 2. Atherosclerosis, including coronary artery disease. Please note that although the presence of coronary artery calcium documents the presence of coronary artery disease, the severity of this disease and any potential stenosis cannot be assessed on this non-gated CT examination. Assessment for potential risk factor modification, dietary therapy or pharmacologic therapy may  be warranted, if clinically indicated. 3. Solid-appearing instrumented thoracic fusion.   Electronically Signed   By: Arne Cleveland M.D.   On: 03/31/2014 21:07    Cardiac Studies:  Assessment/Plan:  Atypical chest pain/history of exertional dyspnea/coronary calcification on CT angio. rule out coronary  insufficiency /MI ruled out GERD  History of scoliosis  Depression  Morbid obesity  Tobacco abuse  History of marijuana abuse in the past  Plan Continue present management Schedule for nuclear stress test today  LOS: 1 day    Azim Gillingham N 04/01/2014, 9:33 AM

## 2014-04-01 NOTE — Progress Notes (Signed)
UR Completed.  Vergie Living [phone] 04/01/2014

## 2014-04-02 NOTE — Discharge Summary (Signed)
Meghan Wilcox, Meghan Wilcox               ACCOUNT NO.:  [id number]  MEDICAL RECORD NO.:  [id number]  LOCATION:  [id number]                        FACILITY:  [facility]  PHYSICIAN:  Ayan Yankey N. Terrence Dupont, M.D. DATE OF BIRTH:  07-04-1976  DATE OF ADMISSION:  03/31/2014 DATE OF DISCHARGE:  04/01/2014                              DISCHARGE SUMMARY   ADMISSION DIAGNOSES: 1. Atypical chest pain, history of exertional dyspnea, coronary     calcification on CT angio rule out coronary insufficiency. 2. Gastroesophageal reflux disease. 3. History of scoliosis. 4. Depression. 5. Morbid obesity. 6. Tobacco abuse. 7. History of marijuana abuse in the past.  DISCHARGE DIAGNOSES: 1. Status post atypical chest pain, myocardial infarction ruled out.     Negative nuclear stress test. 2. Gastroesophageal reflux disease. 3. History of scoliosis. 4. Depression. 5. Morbid obesity. 6. Tobacco abuse. 7. History of marijuana abuse in the past.  DISCHARGE HOME MEDICATIONS: 1. Aspirin 81 mg 1 tablet daily. 2. Protonix 40 mg daily. 3. Ambien 5 mg daily at night as needed. 4. Xanax 0.25 mg at night as needed. 5. Cymbalta 60 mg daily. 6. Ibuprofen 600-800 mg every 6 hours as needed for musculoskeletal     pain.  DIET:  Low salt, low cholesterol.  The patient has been advised to refrain from smoking.  CONDITION AT DISCHARGE:  Stable.  FOLLOWUP:  Follow up with me in 2 weeks.  Follow up with PMD as scheduled.  BRIEF HISTORY AND HOSPITAL COURSE:  Meghan Wilcox is a 38 year old female with past medical history significant for GERD, scoliosis, depression, morbid obesity, tobacco abuse, history of marijuana abuse in the past. She came to the ER complaining of retrosternal chest pain described as pressure, grade 7 or 10 associated with mild shortness of breath since known.  She states chest pain increases with deep breathing and movement.  Denies any fever or chills.  Denies any cough.  Denies any history of trauma or  lifting any heavy objects.  The patient was noted to have mildly elevated D-dimers, subsequently underwent CT angiography with contrast which showed no evidence of acute pulmonary embolism or thoracic dissection but showed coronary calcification of unknown significance.  The patient denies any history of exertional chest pain but does give history of exertional dyspnea, denies any palpitation, lightheadedness, or syncope.  Denies PND, orthopnea, or leg swelling. Denies any cardiac workup in the past.  EKG done in the ED showed normal sinus rhythm with early repolarization changes.  No acute ischemic changes were noted.  PAST MEDICAL HISTORY:  As above.  PAST SURGICAL HISTORY:  She had a cholecystectomy in the past.  Had knee surgery in the past.  Had wisdom tooth extraction in the past.  Had tonsillectomy in the past.  Tubal ligation in the past.  History of cystoscopy and laparoscopic surgery in the past.  PHYSICAL EXAMINATION:  GENERAL:  She was alert, awake, oriented x3 in no acute distress. VITAL SIGNS:  Blood pressure was 89/59, pulse 93, she was afebrile. EYES:  Conjunctivae was pink. NECK:  Supple.  No JVD.  No bruit. LUNGS:  Clear to auscultation without rhonchi or rales. CARDIOVASCULAR:  S1, S2 was normal.  There was no  murmur,  gallop, or rub. ABDOMEN:  Soft.  Bowel sounds were present.  Mildly distended.  Mild epigastric tenderness.  No guarding or rebound. EXTREMITIES:  There is no clubbing, cyanosis, or edema.  There was no evidence of DVT.  LABORATORY DATA:  Sodium was 138, potassium 4.0, BUN 6, creatinine 0.79, glucose was 103, repeat fasting sugar was 104.  Three  sets of cardiac enzymes were negative.  ProBNP was 24.4, cholesterol was 150, triglycerides 145, HDL was low at 33, LDL was 88.  Hemoglobin was 15.7, hematocrit 45.4, white count of 9.2.  Her D-dimer was 1.06.  Hemoglobin A1c was 5.6.  Chest x-ray showed linear scarring or subsegmental atelectasis, left  lower lobe.  Stable postoperative changes.  CT angio showed no evidence of acute pulmonary embolism or thoracic aortic dissection.  Coronary calcification noted and coronary artery is as above. Nuclear stress test showed no evidence of reversible ischemia, EF of 68%.  BRIEF HOSPITAL COURSE:  The patient was admitted to telemetry unit.  MI was ruled out by serial enzymes and EKG.  The patient subsequently underwent nuclear stress test as above.  The patient did not had any episodes of anginal chest pain during the hospital stay.  The patient will be discharged home on above medications and will be followed up in my office in 2 weeks and PMD as scheduled.  The patient has been advised to refrain from smoking and also has been counseled regarding diet, lifestyle modification.     Allegra Lai. Terrence Dupont, M.D.     MNH/MEDQ  D:  04/01/2014  T:  04/02/2014  Job:  [id number]

## 2014-06-03 ENCOUNTER — Encounter (HOSPITAL_COMMUNITY): Payer: Self-pay | Admitting: *Deleted

## 2014-07-23 ENCOUNTER — Other Ambulatory Visit: Payer: Self-pay | Admitting: Internal Medicine

## 2014-07-23 DIAGNOSIS — N644 Mastodynia: Secondary | ICD-10-CM

## 2014-08-05 ENCOUNTER — Ambulatory Visit
Admission: RE | Admit: 2014-08-05 | Discharge: 2014-08-05 | Disposition: A | Discharge status: Home / Self Care | Payer: BC Managed Care – PPO | Source: Ambulatory Visit | Attending: Internal Medicine | Admitting: Internal Medicine

## 2014-08-05 DIAGNOSIS — N644 Mastodynia: Secondary | ICD-10-CM

## 2014-08-21 ENCOUNTER — Other Ambulatory Visit: Payer: Self-pay | Admitting: Internal Medicine

## 2014-08-21 ENCOUNTER — Inpatient Hospital Stay (HOSPITAL_COMMUNITY): Payer: BLUE CROSS/BLUE SHIELD

## 2014-08-21 ENCOUNTER — Encounter: Payer: Self-pay | Admitting: Internal Medicine

## 2014-08-21 ENCOUNTER — Ambulatory Visit
Admission: RE | Admit: 2014-08-21 | Discharge: 2014-08-21 | Disposition: A | Discharge status: Home / Self Care | Payer: BLUE CROSS/BLUE SHIELD | Source: Ambulatory Visit | Attending: Internal Medicine | Admitting: Internal Medicine

## 2014-08-21 ENCOUNTER — Inpatient Hospital Stay (HOSPITAL_COMMUNITY)
Admission: AD | Admit: 2014-08-21 | Discharge: 2014-08-24 | DRG: 392 | Disposition: A | Discharge status: Home / Self Care | Payer: BLUE CROSS/BLUE SHIELD | Source: Ambulatory Visit | Attending: Internal Medicine | Admitting: Internal Medicine

## 2014-08-21 DIAGNOSIS — A09 Infectious gastroenteritis and colitis, unspecified: Secondary | ICD-10-CM | POA: Diagnosis present

## 2014-08-21 DIAGNOSIS — R109 Unspecified abdominal pain: Secondary | ICD-10-CM

## 2014-08-21 DIAGNOSIS — K760 Fatty (change of) liver, not elsewhere classified: Secondary | ICD-10-CM | POA: Diagnosis present

## 2014-08-21 DIAGNOSIS — G4700 Insomnia, unspecified: Secondary | ICD-10-CM | POA: Diagnosis present

## 2014-08-21 DIAGNOSIS — Z79891 Long term (current) use of opiate analgesic: Secondary | ICD-10-CM | POA: Diagnosis not present

## 2014-08-21 DIAGNOSIS — E669 Obesity, unspecified: Secondary | ICD-10-CM | POA: Diagnosis present

## 2014-08-21 DIAGNOSIS — Z800 Family history of malignant neoplasm of digestive organs: Secondary | ICD-10-CM

## 2014-08-21 DIAGNOSIS — F17210 Nicotine dependence, cigarettes, uncomplicated: Secondary | ICD-10-CM | POA: Diagnosis present

## 2014-08-21 DIAGNOSIS — Z823 Family history of stroke: Secondary | ICD-10-CM

## 2014-08-21 DIAGNOSIS — R52 Pain, unspecified: Secondary | ICD-10-CM

## 2014-08-21 DIAGNOSIS — Z801 Family history of malignant neoplasm of trachea, bronchus and lung: Secondary | ICD-10-CM | POA: Diagnosis not present

## 2014-08-21 DIAGNOSIS — D72829 Elevated white blood cell count, unspecified: Secondary | ICD-10-CM | POA: Diagnosis present

## 2014-08-21 DIAGNOSIS — Z833 Family history of diabetes mellitus: Secondary | ICD-10-CM

## 2014-08-21 DIAGNOSIS — Z79899 Other long term (current) drug therapy: Secondary | ICD-10-CM | POA: Diagnosis not present

## 2014-08-21 DIAGNOSIS — R197 Diarrhea, unspecified: Secondary | ICD-10-CM | POA: Diagnosis not present

## 2014-08-21 DIAGNOSIS — K219 Gastro-esophageal reflux disease without esophagitis: Secondary | ICD-10-CM | POA: Diagnosis present

## 2014-08-21 DIAGNOSIS — Z6841 Body Mass Index (BMI) 40.0 and over, adult: Secondary | ICD-10-CM | POA: Diagnosis not present

## 2014-08-21 DIAGNOSIS — Z9049 Acquired absence of other specified parts of digestive tract: Secondary | ICD-10-CM | POA: Diagnosis present

## 2014-08-21 DIAGNOSIS — Z825 Family history of asthma and other chronic lower respiratory diseases: Secondary | ICD-10-CM

## 2014-08-21 DIAGNOSIS — F329 Major depressive disorder, single episode, unspecified: Secondary | ICD-10-CM | POA: Diagnosis present

## 2014-08-21 DIAGNOSIS — Z8249 Family history of ischemic heart disease and other diseases of the circulatory system: Secondary | ICD-10-CM

## 2014-08-21 DIAGNOSIS — Z9851 Tubal ligation status: Secondary | ICD-10-CM

## 2014-08-21 DIAGNOSIS — F419 Anxiety disorder, unspecified: Secondary | ICD-10-CM | POA: Diagnosis present

## 2014-08-21 DIAGNOSIS — K529 Noninfective gastroenteritis and colitis, unspecified: Secondary | ICD-10-CM | POA: Diagnosis present

## 2014-08-21 HISTORY — DX: Major depressive disorder, single episode, unspecified: F32.9

## 2014-08-21 HISTORY — DX: Anxiety disorder, unspecified: F41.9

## 2014-08-21 MED ORDER — HYDROMORPHONE HCL 1 MG/ML IJ SOLN
1.0000 mg | Freq: Once | INTRAMUSCULAR | Status: AC
Start: 2014-08-21 — End: 2014-08-21
  Administered 2014-08-21: 1 mg via INTRAVENOUS
  Filled 2014-08-21: qty 1

## 2014-08-21 MED ORDER — METRONIDAZOLE IN NACL 5-0.79 MG/ML-% IV SOLN
500.0000 mg | Freq: Three times a day (TID) | INTRAVENOUS | Status: DC
  Administered 2014-08-21 – 2014-08-24 (×8): 500 mg via INTRAVENOUS
  Filled 2014-08-21 (×9): qty 100

## 2014-08-21 MED ORDER — CIPROFLOXACIN IN D5W 400 MG/200ML IV SOLN
400.0000 mg | Freq: Two times a day (BID) | INTRAVENOUS | Status: DC
  Administered 2014-08-21: 400 mg via INTRAVENOUS
  Filled 2014-08-21 (×2): qty 200

## 2014-08-21 MED ORDER — ALPRAZOLAM 0.25 MG PO TABS
0.2500 mg | ORAL_TABLET | Freq: Every evening | ORAL | Status: DC | PRN
Start: 2014-08-21 — End: 2014-08-24

## 2014-08-21 MED ORDER — PANTOPRAZOLE SODIUM 40 MG PO TBEC
40.0000 mg | DELAYED_RELEASE_TABLET | Freq: Two times a day (BID) | ORAL | Status: DC
  Administered 2014-08-21 – 2014-08-24 (×7): 40 mg via ORAL
  Filled 2014-08-21 (×7): qty 1

## 2014-08-21 MED ORDER — IOHEXOL 300 MG/ML  SOLN
125.0000 mL | Freq: Once | INTRAMUSCULAR | Status: AC | PRN
  Administered 2014-08-21: 125 mL via INTRAVENOUS

## 2014-08-21 MED ORDER — DULOXETINE HCL 60 MG PO CPEP
60.0000 mg | ORAL_CAPSULE | Freq: Every day | ORAL | Status: DC
  Administered 2014-08-21 – 2014-08-24 (×4): 60 mg via ORAL
  Filled 2014-08-21 (×4): qty 1

## 2014-08-21 MED ORDER — SODIUM CHLORIDE 0.9 % IV SOLN
INTRAVENOUS | Status: DC
  Administered 2014-08-21 – 2014-08-24 (×6): via INTRAVENOUS

## 2014-08-21 MED ORDER — MORPHINE SULFATE 2 MG/ML IJ SOLN
2.0000 mg | INTRAMUSCULAR | Status: DC | PRN
  Administered 2014-08-21 – 2014-08-22 (×9): 2 mg via INTRAVENOUS
  Filled 2014-08-21 (×10): qty 1

## 2014-08-21 MED ORDER — HYDROCODONE-ACETAMINOPHEN 5-325 MG PO TABS
1.0000 | ORAL_TABLET | ORAL | Status: DC | PRN
  Administered 2014-08-22 (×2): 2 via ORAL
  Administered 2014-08-22: 1 via ORAL
  Filled 2014-08-21 (×3): qty 2

## 2014-08-21 MED ORDER — ZOLPIDEM TARTRATE 5 MG PO TABS
5.0000 mg | ORAL_TABLET | Freq: Every evening | ORAL | Status: DC | PRN
Start: 2014-08-21 — End: 2014-08-24
  Administered 2014-08-21 – 2014-08-23 (×3): 5 mg via ORAL
  Filled 2014-08-21 (×3): qty 1

## 2014-08-21 MED ORDER — ONDANSETRON HCL 4 MG/2ML IJ SOLN
4.0000 mg | Freq: Four times a day (QID) | INTRAMUSCULAR | Status: DC | PRN
  Administered 2014-08-21 – 2014-08-22 (×3): 4 mg via INTRAVENOUS
  Filled 2014-08-21 (×3): qty 2

## 2014-08-21 MED ORDER — DOCUSATE SODIUM 100 MG PO CAPS
100.0000 mg | ORAL_CAPSULE | Freq: Two times a day (BID) | ORAL | Status: DC
  Administered 2014-08-21: 100 mg via ORAL
  Filled 2014-08-21 (×6): qty 1

## 2014-08-21 MED ORDER — ACETAMINOPHEN 325 MG PO TABS
650.0000 mg | ORAL_TABLET | Freq: Four times a day (QID) | ORAL | Status: DC | PRN
  Administered 2014-08-21 – 2014-08-23 (×2): 650 mg via ORAL
  Filled 2014-08-21 (×2): qty 2

## 2014-08-21 MED ORDER — ALUM & MAG HYDROXIDE-SIMETH 200-200-20 MG/5ML PO SUSP: 30.0000 mL | Freq: Four times a day (QID) | ORAL | Status: DC | PRN

## 2014-08-21 MED ORDER — CIPROFLOXACIN IN D5W 400 MG/200ML IV SOLN
400.0000 mg | Freq: Two times a day (BID) | INTRAVENOUS | Status: DC
  Administered 2014-08-22 – 2014-08-24 (×5): 400 mg via INTRAVENOUS
  Filled 2014-08-21 (×7): qty 200

## 2014-08-21 MED ORDER — METRONIDAZOLE IN NACL 5-0.79 MG/ML-% IV SOLN
500.0000 mg | Freq: Three times a day (TID) | INTRAVENOUS | Status: DC
  Administered 2014-08-21: 500 mg via INTRAVENOUS
  Filled 2014-08-21 (×3): qty 100

## 2014-08-21 MED ORDER — CHLORHEXIDINE GLUCONATE 0.12 % MT SOLN
15.0000 mL | Freq: Two times a day (BID) | OROMUCOSAL | Status: DC
  Administered 2014-08-22 – 2014-08-23 (×2): 15 mL via OROMUCOSAL
  Filled 2014-08-21 (×8): qty 15

## 2014-08-21 MED ORDER — ACETAMINOPHEN 650 MG RE SUPP: 650.0000 mg | Freq: Four times a day (QID) | RECTAL | Status: DC | PRN

## 2014-08-21 MED ORDER — CETYLPYRIDINIUM CHLORIDE 0.05 % MT LIQD: 7.0000 mL | Freq: Two times a day (BID) | OROMUCOSAL | Status: DC

## 2014-08-21 MED ORDER — ONDANSETRON HCL 4 MG PO TABS: 4.0000 mg | ORAL_TABLET | Freq: Four times a day (QID) | ORAL | Status: DC | PRN

## 2014-08-21 NOTE — Progress Notes (Signed)
Patient complaining of severe 10/10 abdominal pain, nausea, vomiting unrealived by PRN zofran and morphine. Pt in severe discomfort and tears. Dr. Hulen Skains contacted and telephone order received for stat abd portable xray with patient sitting upright and a one time order of 90m dilaudid for pain.

## 2014-08-21 NOTE — Progress Notes (Signed)
08/21/2014 1435 UR completed. Chart reviewed. Jonnie Finner RN CCM Case Mgmt phone [phone]

## 2014-08-21 NOTE — Consult Note (Signed)
Meghan Wilcox Feb 26, 1976  [id number].   Primary Care MD: Dr. Lang Snow Requesting MD: Dr. Lang Snow Chief Complaint/Reason for Consult:diverticulitis HPI: This is a 39 white female who is otherwise relatively healthy who began having diffuse abdominal pain yesterday around mid morning.  She developed abdominal distention and even went an bought bigger pants at work as her normal sized ones were uncomfortable.  She denies N/V.  She denies diarrhea.  She is having normal bowel movements and no blood.  She saw an NP in her PCP office yesterday who checked some labs.  Her pain persisted and continued to worsen.  She called back this morning and she was sent for a CT scan.  She then saw Dr. Brigitte Pulse after that who admitted her.  Her CT scan reveals ascending/ hepatic flexure colonic stranding and inflammation.  The base of appendix is normal, but the body and tip have some surrounding stranding and inflammation.  This is felt to likely be secondary to the colonic inflammatory process.  We have been asked to see the patient for further evaluation.  ROS: Please see HPI, otherwise all other systems have been reviewed and are negative.  Family History  Problem Relation Age of Onset  . Diabetes Mother   . Heart disease Mother   . Liver disease Mother   . Cirrhosis Mother   . Irritable bowel syndrome Mother   . Diabetes Father   . Heart disease Father   . Liver disease Father   . Cirrhosis Father   . Diabetes Brother   . Colon cancer Paternal Grandfather   . Crohn's disease Mother     Past Medical History  Diagnosis Date  . Hepatomegaly   . Scoliosis   . History of ovarian cyst   . Family history of malignant neoplasm of gastrointestinal tract   . Gastroparesis   . Esophagitis   . Seizures     post part  eclampsia  73yr ago  . GERD (gastroesophageal reflux disease)   . Blood transfusion age 89yr   Antibody scewwn neg. 08/20/10 prior to cholecystectomy  . Depression   . Anxiety      Past Surgical History  Procedure Laterality Date  . Cholecystectomy  jan 2012    Dr AmRonnald Collum. Wisdom tooth extraction    . Steel rod in her back  age [id number]. Knee surgery      LEFT  . Tonisllectomy    . Inner ear surgery    . Tubal ligation    . Tonsillectomy    . Bladder suspension  10/01/2011    Procedure: TRANSVAGINAL TAPE (TVT) PROCEDURE;  Surgeon: ToClarene DukeMD;  [facility];  Service: Gynecology;  Laterality: N/A;  Solyx Sling  . Laparoscopy  10/01/2011    Procedure: LAPAROSCOPY OPERATIVE;  Surgeon: ToClarene DukeMD;  [facility];  Service: Gynecology;  Laterality: N/A;  removal of right fimbria, fulgeration of endometriosis  . Cystoscopy  10/01/2011    Procedure: CYSTOSCOPY;  Surgeon: ToClarene DukeMD;  [facility];  Service: Gynecology;  Laterality: N/A;    Social History:  reports that she quit smoking about 1 weeks ago. Her smoking use included Cigarettes. She has a 12.5 pack-year smoking history. She has never used smokeless tobacco. She reports that she does not drink alcohol or use illicit drugs.  Allergies: No Known Allergies  Medications Prior to Admission  Medication Sig Dispense Refill  . ALPRAZolam (XANAX) 0.25 MG  tablet Take 0.25 mg by mouth at bedtime as needed for anxiety.    . DULoxetine (CYMBALTA) 60 MG capsule Take 60 mg by mouth daily.     Marland Kitchen ibuprofen (ADVIL,MOTRIN) 200 MG tablet Take 600-800 mg by mouth every 6 (six) hours as needed. For headache/pain.    . traMADol (ULTRAM) 50 MG tablet Take 50 mg by mouth every 6 (six) hours as needed for moderate pain.    Marland Kitchen zolpidem (AMBIEN) 10 MG tablet Take 0.5 tablets (5 mg total) by mouth at bedtime as needed. 30 tablet 0    Blood pressure 108/71, pulse 97, temperature 98.1 F (36.7 C), temperature source Oral, resp. rate 20, height 5' 2"  (1.575 m), weight 226 lb (102.513 kg), SpO2 96 %. Physical Exam: General: pleasant, obese white female who is laying in bed in NAD HEENT: head is  normocephalic, atraumatic.  Sclera are noninjected.  PERRL.  Ears and nose without any masses or lesions.  Mouth is pink and moist Heart: regular, rate, and rhythm.  Normal s1,s2. No obvious murmurs, gallops, or rubs noted.  Palpable radial and pedal pulses bilaterally Lungs: CTAB, no wheezes, rhonchi, or rales noted.  Respiratory effort nonlabored Abd: soft, mild distention, diffuse abdominal tenderness with some voluntary guarding.  When pressed she states her pain is likely worse in RUQ, +BS, no masses, hernias, or organomegaly MS: all 4 extremities are symmetrical with no cyanosis, clubbing, or edema. Skin: warm and dry with no masses, lesions, or rashes Psych: A&Ox3 with an appropriate affect.    No results found for this or any previous visit (from the past 48 hour(s)). Ct Abdomen Pelvis W Contrast  08/21/2014   CLINICAL DATA:  39 year old female with generalized abdominal pain for 2 days, now severe. Initial encounter.  EXAM: CT ABDOMEN AND PELVIS WITH CONTRAST  TECHNIQUE: Multidetector CT imaging of the abdomen and pelvis was performed using the standard protocol following bolus administration of intravenous contrast.  CONTRAST:  151m OMNIPAQUE IOHEXOL 300 MG/ML  SOLN  COMPARISON:  CT Abdomen and Pelvis 11/19/2010.  FINDINGS: Chronic scoliosis with posterior spinal rod hardware. Negative lung bases. No pericardial or pleural effusion.  No acute osseous abnormality identified.  No pelvic free fluid. Negative uterus and adnexa. Diminutive and unremarkable bladder. Negative distal colon.  Sigmoid diverticula with no definite active inflammation. Diverticulosis continues into the splenic flexure and transverse colon.  Moderately to severely inflamed ascending colon with mesenteric stranding. Series 2, image 53). Epicenter just proximal to the splenic flexure. Diverticula in this region. The cecum appears relatively spared. The terminal ileum is within normal limits. There is a retrocecal appendix.  The base of the appendix is normal. The body and tip of the appendix are surrounded by stranding (series 2, image 60, but I favor this is secondary to the ascending colon abnormality.  No dilated small bowel. Oral contrast has not reached the distal small bowel. Negative stomach except for small hiatal hernia. Negative duodenum.  A chronic hepatic steatosis and surgical absence of the gallbladder. Spleen, pancreas, adrenal glands, portal venous system, and major arterial structures in the abdomen and pelvis are within normal limits. No abdominal free fluid or free air. No lymphadenopathy.  IMPRESSION: 1. Moderate to severe inflammation of the ascending colon, favor acute diverticulitis. No free fluid or free air. 2. There is inflammation surrounding the distal appendix, but I favor this is secondary. If the patient does not improve as expected then consider a repeat CT Abdomen and Pelvis with oral and IV  contrast (e.g. In 12-24 hr). 3. Chronic hepatic steatosis.   Electronically Signed   By: Lars Pinks M.D.   On: 08/21/2014 11:57       Assessment/Plan 1. Abdominal pain, right sided diverticulitis vs colitis  Plan: 1. Her CT scan likely reveals right sided diverticulitis vs colitis.  Her appendix does appear to have some stranding; however, her pain just started about 36 hours ago.  It would be uncommon to have that much inflammation of her colon as well just from 36 hours of appendicitis.  We would recommend treating the patient as if she has diverticulitis or colitis.  Remain NPO for now and continue Cipro and Flagyl.  Her WBC was 12K as an outpatient.  Recheck labs in the morning.  Hopefully the patient's symptoms will improve with conservative management.  If not or she acutely worsens, she may require surgical intervention.  I have discussed this with the patient and her husband who both understand and agree with her current plan.  We will follow along.  Thank you for this consultation.  Deveion Denz  E 08/21/2014, 6:55 PM Pager: [phone]

## 2014-08-21 NOTE — H&P (Signed)
Vital Signs  Entered weight:  229  lbs., 8 oz.  Calculated Weight: 229.50 lbs., ( 104.10 kg) Height: 64 in., ( 162.56 cm) Pulse rate: 100 Pulse rhythm: regular  Blood Pressure #1: 106 / 80 mm Hg    BMI: 39.39 BSA: 2.07 Wt Chg: -0.50 lbs since 08/20/2014  Vitals entered by: Carrolyn Leigh  CNA on August 21, 2014 12:24 PM           History of Present Illness  History from: patient Reason for visit: See chief complaint Chief Complaint: pt is here for follow up after ct scan  History of Present Illness: Calypso is back today for repeat examination for abdominal pain.  She was seen yesterday by Avanell Shackleton, NP for acute onset diffuse abdominal pain.  She states that she had some back pain over the weekend but no abdominal pain.  Yesterday, she developed new onset diffuse abdominal pain that felt like her abdomen was swelling and getting larger and larger.  White blood count yesterday was 10 with minimally elevated ALT consistent with her history of fatty liver disease.  She was sent for a 2 view abdomen that showed stool throughout, but no evidence of obstruction or free air.  She called back overnight stating that her pain was getting worse so she was sent for a CT this morning which shows moderate to severe inflammation of the ascending colon suggestive of acute diverticulitis with some surrounding inflammation of the distal appendix.  Radiologist favored that this was secondary to diverticulitis rather than appendicitis.  Currently, the patient continues to have severe diffuse abdominal pain.  No focal nature of pain.  Tried probiotics, gas-X and enemas last night.  Had BM after enema.  Took 3 gas-x without relief.  Took Tramadol.  Normal BM.  No constipation or diarrhea.  No nausea, vomitting.  Appetite is OK.  Ate pizza last night.   No fever, sweats.  Some chills.      Never had colonoscopy.     Review of Systems  General:       Complains of chills, fatigue.        Denies fevers,  sweats.   Cardiovascular:       Denies chest pains, dyspnea on exertion, peripheral edema.   Respiratory:       Denies dyspnea.   Gastrointestinal:       Complains of see HPI.   Musculoskeletal:       Complains of back pain.   Heme/Lymphatic:       Denies bleeding.    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  S/P BTL s/p cholecystectomy (1/12)  Family History (reviewed - no changes required): Father: colon polyps, DM2, S/P pacer, cirrhosis (non-etohic)-suddenly presented-death, died 91 Mother: Cirrhosis due to NAFLD, DM2, COPD on oxygen, (+) smoker, CVA (25); Lung Cancer (NSCLC)- deceased 02/04/23; blood clots in setting of cancer Grandparents: PGF-colon cancer, lung cancer (died 34s) Brother-DM2, asthma 3 daughters-healthy Social History (reviewed - no changes required): Married with 3 daughters (9, 47, 41); 2 stepchildren (43 son, 38 daughter) Education: HS Occupation: Actor Tobacco: Closet smoker; 5-6 cigs x 18 years-->less than 1ppd; previously quit for 9 months- quit 6/14--restarted 12/14 Alcohol: small No Illicit Drugs.  Family History Summary:  Reviewed history Last on 07/23/2014 and no changes required:08/21/2014 Mother Ailene Ravel.) - Has Family History of Lung Cancer - Entered On: 01/23/2013 Mother Ailene Ravel.) - Has Family History of Diabetes - Entered On: 04/16/2013 Father Ailene Ravel.) - Has Family History of Diabetes - Entered On: 04/24/2013 Father Ailene Ravel.) - Has Family History of Heart Disease - Entered On: 04/24/2013 Father Ailene Ravel.) - Has Family History of Other Medical Problems - Entered On: 04/24/2013 Daughter Ailene Ravel.) - Has Family History of Diabetes - Entered On:  10/29/2013  General Comments - FH: Father: colon polyps, DM2, S/P pacer, cirrhosis (non-etohic)-suddenly presented-death, died 62 Mother: Cirrhosis due to NAFLD, DM2, COPD on oxygen, (+) smoker, CVA (2); Lung Cancer (NSCLC)- deceased 2023-02-11; blood clots in setting of cancer Grandparents: PGF-colon cancer, lung cancer (died 40s) Brother-DM2, asthma 3 daughters-healthy  Social History:    Reviewed history from 01/25/2014 and no changes required:       Married with 3 daughters (18, 38, 8); 2 stepchildren (68 son, 18 daughter)       Education: HS       Occupation: Actor       Tobacco: Closet smoker; 5-6 cigs x 18 years-->less than 1ppd; previously quit for 9 months- quit 6/14--restarted 12/14       Alcohol: small       No Illicit Drugs.   Physical Exam  General appearance: in obvious discomfort with sunglasses on, lying on her side  Eyes  External: conjunctivae and lids normal Pupils: equal, round, reactive to light and accommodation  Ears, Nose and Throat  External ears: normal, no lesions or deformities External nose: normal, no lesions or deformities Hearing: grossly intact Nasal: mucosa, septum, and turbinates normal Dental: good dentition Pharynx: clear  Neck  Neck: supple, no masses, trachea midline Thyroid: no nodules, masses, tenderness, or enlargement  Respiratory  Respiratory effort: no intercostal retractions or use of accessory muscles Auscultation: no rales, rhonchi, or wheezes  Cardiovascular  Auscultation: S1, S2, no murmur, rub, or gallop Periph. circulation: no cyanosis, clubbing, edema  Gastrointestinal  Abdomen: distended with hypoactive bowel sounds; diffuse tenderness most prominent in the right greater than left lower quadrant; some guarding; no definite rebound Liver and spleen: no enlargement or nodularity  Lymphatic  Neck: no cervical adenopathy  Musculoskeletal  Gait and station: normal Digits and nails: no  clubbing, cyanosis, petechiae, or nodes Head and neck: normal alignment and mobility Spine, ribs, pelvis: scoliosis RUE: normal ROM and strength, no joint enlargement or tenderness LUE: normal ROM and strength, no joint enlargement or tenderness RLE: normal ROM and strength, no joint enlargement or tenderness LLE: normal ROM and strength, no joint enlargement or tenderness  Neurologic  Reflexes: 2+, symmetric  Mental Status Exam  Judgment, insight: intact Orientation: oriented to time, place, and person Memory: intact Mood and affect: anxious today  Labs: ! Amylase, Serum            36 U/L                      31-124  Tests: (2) Lipase, Serum (161096) ! Lipase, Serum             32 U/L                      0-59  COMPLETE METABOLIC (0454)   GLUCOSE              [H]  113 mg/dl  60-110   BUN                       9 mg/dl                     5-23   CREATININE                0.8 mg/dl                   0.3-1.5  eGFR Non-African American                             79.9  eGFR African American                             96.6   SODIUM                    140 mEq/L                   135-148   POTASSIUM                 4.2 mEq/L                   3.5-5.3   CHLORIDE                  108 mEq/L                   80-111   CO2                       26 mEq/L                    15-35   CALCIUM                   9.3 mg/dL                   7.0-10.5   TOTAL PROTEIN             7.4 g/dL                    6.0-8.5   ALBUMIN                   3.6 g/dL                    2.0-5.5   AST                       33 IU/L                     7-45   ALT                  [H]  44 IU/L                     5-40   ALK PHOS                  62 IU/L                     37-137   TOTAL BILIRUBIN           0.4  mg/dl                   0.1-1.5    WBC                  [H]  12.70 K/uL                  4.10-10.90   LYM                       2.6 K/uL                    0.6-4.1 ! MID                        0.9 K/uL                    0.0-1.8   GRAN                 [H]  9.3 K/uL                    2.0-7.8   LYM%                      20.1 %                      10.0-58.5 ! MID%                      6.9 %                       0.1-24.0   GRAN%                     73.0                        37.0-92.0   RBC                       4.4 M/uL                    4.2-6.3   HGB                       14.6 g/dL                   12.0-18.0   HCT                       43.7 %                      37.0-51.0   MCV                  [H]  99.2 fL                     80.0-97.0   MCH                  [H]  33.1 pg                     26.0-32.0   MCHC  33.4 g/dL                   31.0-36.0   PLT                       240 K/uL                    140-440  Impression & Recommendations:  Problem # 1:  Diverticulitis, acute (ICD-562.11) (TTC76-F94.32) Worsening abdominal pain, tenderness, and leukocytosis in the setting of CT findings suggestive of acute diverticulitis.  We'll admit for IV antibiotics and serial abdominal exams.  General surgery consult to consider exploratory surgery to rule out appendicitis, though CT findings are more consistent with diverticulitis.  Problem # 2:  FATTY LIVER DISEASE (ICD-571.8) (ICD10-K76.0) Mildly elevated ALT consistent with known fatty liver disease.   Current Meds:  CHANTIX CONTINUING MONTH PAK 1 MG ORAL TABS (VARENICLINE TARTRATE) take as directed TRAMADOL HCL 50 MG TABS (TRAMADOL HCL) 1 pill every 8 hours as needed for moderate pain ALPRAZOLAM 0.5 MG TABS (ALPRAZOLAM) 1 pill every 8 hours as needed for anxiety CYMBALTA 60 MG CPEP (DULOXETINE HCL) 1 pill daily AMBIEN 10 MG TABS (ZOLPIDEM TARTRATE) take 1 tablet by mouth at bedtime   Comments: patient admitted   Electronically signed by Link Snuffer MD on 08/21/2014 at 1:23 PM

## 2014-08-21 NOTE — Progress Notes (Signed)
Dr. Brigitte Pulse telephone order okay for NPO with sips of meds. Order updated in pt chart.

## 2014-08-21 NOTE — Progress Notes (Signed)
Meghan Wilcox [id number] Code Status: FULL Admission Data: 08/21/2014 3:02 PM Attending Provider:  Brigitte Pulse ADG:NPHQ, Gwyndolyn Saxon, MD Consults/ Treatment Team: Treatment Team:  Md Edison Pace, MD  Meghan Wilcox is a 39 y.o. female patient admitted from ED awake, alert - oriented  X 3 - no acute distress noted.  VSS - Blood pressure 108/71, pulse 97, temperature 98.1 F (36.7 C), temperature source Oral, resp. rate 20, height 5' 2"  (1.575 m), weight 102.513 kg (226 lb), SpO2 96 %.    IV in place, occlusive dsg intact without redness.  Orientation to room, and floor completed with information packet given to patient/family.  Patient declined safety video at this time.  Admission INP armband ID verified with patient/family, and in place.   SR up x 2, fall assessment complete, with patient and family able to verbalize understanding of risk associated with falls, and verbalized understanding to call nsg before up out of bed.  Call light within reach, patient able to voice, and demonstrate understanding.  Skin, clean-dry- intact without evidence of bruising, or skin tears.   No evidence of skin break down noted on exam.     Will cont to eval and treat per MD orders.  Delman Cheadle, RN 08/21/2014 3:02 PM

## 2014-08-22 DIAGNOSIS — R197 Diarrhea, unspecified: Secondary | ICD-10-CM | POA: Diagnosis not present

## 2014-08-22 LAB — CBC
HCT: 37.2 % (ref 36.0–46.0)
Hemoglobin: 12.4 g/dL (ref 12.0–15.0)
MCH: 30.7 pg (ref 26.0–34.0)
MCHC: 33.3 g/dL (ref 30.0–36.0)
MCV: 92.1 fL (ref 78.0–100.0)
PLATELETS: 160 10*3/uL (ref 150–400)
RBC: 4.04 MIL/uL (ref 3.87–5.11)
RDW: 13.8 % (ref 11.5–15.5)
WBC: 7.2 10*3/uL (ref 4.0–10.5)

## 2014-08-22 LAB — BASIC METABOLIC PANEL
Anion gap: 11 (ref 5–15)
BUN: 8 mg/dL (ref 6–23)
CHLORIDE: 106 meq/L (ref 96–112)
CO2: 20 mmol/L (ref 19–32)
Calcium: 8.3 mg/dL — ABNORMAL LOW (ref 8.4–10.5)
Creatinine, Ser: 0.7 mg/dL (ref 0.50–1.10)
GFR calc non Af Amer: >90 mL/min (ref >90)
GLUCOSE: 85 mg/dL (ref 70–99)
Potassium: 4.1 mmol/L (ref 3.5–5.1)
SODIUM: 137 mmol/L (ref 135–145)

## 2014-08-22 LAB — CLOSTRIDIUM DIFFICILE BY PCR: Toxigenic C. Difficile by PCR: NEGATIVE

## 2014-08-22 MED ORDER — BISMUTH SUBSALICYLATE 262 MG/15ML PO SUSP
30.0000 mL | ORAL | Status: DC | PRN
  Filled 2014-08-22: qty 236

## 2014-08-22 MED ORDER — ENOXAPARIN SODIUM 40 MG/0.4ML ~~LOC~~ SOLN
40.0000 mg | SUBCUTANEOUS | Status: DC
  Administered 2014-08-22 – 2014-08-23 (×2): 40 mg via SUBCUTANEOUS
  Filled 2014-08-22 (×3): qty 0.4

## 2014-08-22 NOTE — Progress Notes (Signed)
Subjective: Still feels "pretty rough" but pain has improved.  Had ~4 episodes of diarrhea and N/V X 1.  Pain meds are helping.  Less distended.    Objective: Vital signs in last 24 hours: Temp:  [97.7 F (36.5 C)-98.4 F (36.9 C)] 98.4 F (36.9 C) (01/21 0515) Pulse Rate:  [84-97] 84 (01/20 2137) Resp:  [19-20] 20 (01/21 0515) BP: (98-108)/(57-71) 98/57 mmHg (01/21 0515) SpO2:  [93 %-96 %] 93 % (01/21 0515) Weight:  [102.513 kg (226 lb)] 102.513 kg (226 lb) (01/20 1449) Weight change:  Last BM Date: 08/21/14  CBG (last 3)  No results for input(s): GLUCAP in the last 72 hours.  Intake/Output from previous day: 01/20 0701 - 01/21 0700 In: 1805 [P.O.:245; I.V.:1260; IV Piggyback:300] Out: 201 [Urine:200; Emesis/NG output:1] Intake/Output this shift:    General appearance: sleeping initially, awakens and in mild discomfort Eyes: no scleral icterus Throat: oropharynx moist without erythema Resp: clear to auscultation bilaterally Cardio: regular rate and rhythm GI: less distended, hypoactive bowel sounds; diffuse tenderness, most prominent in right lower quadrant but significantly improved; no rebound or guarding Extremities: no clubbing, cyanosis or edema   Lab Results: pending  Studies/Results: Ct Abdomen Pelvis W Contrast  08/21/2014   CLINICAL DATA:  39 year old female with generalized abdominal pain for 2 days, now severe. Initial encounter.  EXAM: CT ABDOMEN AND PELVIS WITH CONTRAST  TECHNIQUE: Multidetector CT imaging of the abdomen and pelvis was performed using the standard protocol following bolus administration of intravenous contrast.  CONTRAST:  174m OMNIPAQUE IOHEXOL 300 MG/ML  SOLN  COMPARISON:  CT Abdomen and Pelvis 11/19/2010.  FINDINGS: Chronic scoliosis with posterior spinal rod hardware. Negative lung bases. No pericardial or pleural effusion.  No acute osseous abnormality identified.  No pelvic free fluid. Negative uterus and adnexa. Diminutive and  unremarkable bladder. Negative distal colon.  Sigmoid diverticula with no definite active inflammation. Diverticulosis continues into the splenic flexure and transverse colon.  Moderately to severely inflamed ascending colon with mesenteric stranding. Series 2, image 53). Epicenter just proximal to the splenic flexure. Diverticula in this region. The cecum appears relatively spared. The terminal ileum is within normal limits. There is a retrocecal appendix. The base of the appendix is normal. The body and tip of the appendix are surrounded by stranding (series 2, image 60, but I favor this is secondary to the ascending colon abnormality.  No dilated small bowel. Oral contrast has not reached the distal small bowel. Negative stomach except for small hiatal hernia. Negative duodenum.  A chronic hepatic steatosis and surgical absence of the gallbladder. Spleen, pancreas, adrenal glands, portal venous system, and major arterial structures in the abdomen and pelvis are within normal limits. No abdominal free fluid or free air. No lymphadenopathy.  IMPRESSION: 1. Moderate to severe inflammation of the ascending colon, favor acute diverticulitis. No free fluid or free air. 2. There is inflammation surrounding the distal appendix, but I favor this is secondary. If the patient does not improve as expected then consider a repeat CT Abdomen and Pelvis with oral and IV contrast (e.g. In 12-24 hr). 3. Chronic hepatic steatosis.   Electronically Signed   By: LLars PinksM.D.   On: 08/21/2014 11:57   Dg Abd Portable 2v  08/21/2014   CLINICAL DATA:  Abdominal pain with vomiting and diarrhea  EXAM: PORTABLE ABDOMEN - 2 VIEW  COMPARISON:  CT abdomen and pelvis obtained earlier in the day  FINDINGS: Supine and left lateral decubitus images were obtained. There is  gas in the large bowel. There is a paucity of small bowel gas. Multiple air-fluid levels are identified. No bowel dilatation. No free air. There is a fixation rod for  scoliosis in place in the lower thoracic and upper lumbar regions. There are surgical clips in the right upper quadrant region.  IMPRESSION: Relative paucity of small bowel gas with multiple air-fluid levels. Suspect enteritis or ileus is most likely, although a degree of obstruction could present in this manner. No free air.   Electronically Signed   By: Lowella Grip M.D.   On: 08/21/2014 19:50     Medications: Scheduled: . antiseptic oral rinse  7 mL Mouth Rinse q12n4p  . chlorhexidine  15 mL Mouth Rinse BID  . ciprofloxacin  400 mg Intravenous Q12H  . docusate sodium  100 mg Oral BID  . DULoxetine  60 mg Oral Daily  . metronidazole  500 mg Intravenous Q8H  . pantoprazole  40 mg Oral BID   Continuous: . sodium chloride 100 mL/hr at 08/22/14 0439    Assessment/Plan: Principal Problem: 1. Colitis, acute- appreciate surgery evaluation- clinical picture and CT findings consistent with acute colitis/diverticulitis, likely infectious.  Improvement makes appendicitis unlikely.  Continue Cipro/Flagyl, IV fluids, IV narcotics.  Will start clear liquids and advance slowly as tolerated. Active Problems: 2. Leukocytosis- CBC pending. 3.  Diarrhea- C.diff pending per RN protocol.  Secondary to #1.4. DVT prophylaxis- will start Lovenox as low likelihood for need for surgery. 4. Disposition- possible discharge to home in 1-2 days if improving and tolerating diet.    LOS: 1 day   Marton Redwood 08/22/2014, 7:37 AM

## 2014-08-22 NOTE — Progress Notes (Signed)
Patient ID: Meghan Wilcox, female   DOB: [birth year], 39 y.o.   MRN: [id number]    Subjective: Pt feels maybe only slightly better.  Still with a lot of pain.  Now she is having some nausea and diarrhea overnight.  Objective: Vital signs in last 24 hours: Temp:  [97.7 F (36.5 C)-98.4 F (36.9 C)] 98.4 F (36.9 C) (01/21 0515) Pulse Rate:  [84-97] 84 (01/20 2137) Resp:  [19-20] 20 (01/21 0515) BP: (98-108)/(57-71) 98/57 mmHg (01/21 0515) SpO2:  [93 %-96 %] 93 % (01/21 0515) Weight:  [226 lb (102.513 kg)] 226 lb (102.513 kg) (01/20 1449) Last BM Date: 08/21/14  Intake/Output from previous day: 01/20 0701 - 01/21 0700 In: 1805 [P.O.:245; I.V.:1260; IV Piggyback:300] Out: 201 [Urine:200; Emesis/NG output:1] Intake/Output this shift: Total I/O In: 400 [IV Piggyback:400] Out: 500 [Urine:500]  PE: Abd: soft, but still distended, and still very tender, specifically on the right side, in upper abdomen. +BS Heart: regular Lungs: CTAB  Lab Results:   Recent Labs  08/22/14 0635  WBC 7.2  HGB 12.4  HCT 37.2  PLT 160   BMET No results for input(s): NA, K, CL, CO2, GLUCOSE, BUN, CREATININE, CALCIUM in the last 72 hours. PT/INR No results for input(s): LABPROT, INR in the last 72 hours. CMP     Component Value Date/Time   NA 135* 04/01/2014 0031   K 3.8 04/01/2014 0031   CL 101 04/01/2014 0031   CO2 20 04/01/2014 0031   GLUCOSE 104* 04/01/2014 0031   BUN 6 04/01/2014 0031   CREATININE 0.71 04/01/2014 0031   CALCIUM 8.4 04/01/2014 0031   PROT 7.1 04/01/2014 0031   ALBUMIN 3.3* 04/01/2014 0031   AST 25 04/01/2014 0031   ALT 26 04/01/2014 0031   ALKPHOS 58 04/01/2014 0031   BILITOT 0.5 04/01/2014 0031   GFRNONAA >90 04/01/2014 0031   GFRAA >90 04/01/2014 0031   Lipase  No results found for: LIPASE     Studies/Results: Ct Abdomen Pelvis W Contrast  08/21/2014   CLINICAL DATA:  39 year old female with generalized abdominal pain for 2 days, now severe. Initial  encounter.  EXAM: CT ABDOMEN AND PELVIS WITH CONTRAST  TECHNIQUE: Multidetector CT imaging of the abdomen and pelvis was performed using the standard protocol following bolus administration of intravenous contrast.  CONTRAST:  165m OMNIPAQUE IOHEXOL 300 MG/ML  SOLN  COMPARISON:  CT Abdomen and Pelvis 11/19/2010.  FINDINGS: Chronic scoliosis with posterior spinal rod hardware. Negative lung bases. No pericardial or pleural effusion.  No acute osseous abnormality identified.  No pelvic free fluid. Negative uterus and adnexa. Diminutive and unremarkable bladder. Negative distal colon.  Sigmoid diverticula with no definite active inflammation. Diverticulosis continues into the splenic flexure and transverse colon.  Moderately to severely inflamed ascending colon with mesenteric stranding. Series 2, image 53). Epicenter just proximal to the splenic flexure. Diverticula in this region. The cecum appears relatively spared. The terminal ileum is within normal limits. There is a retrocecal appendix. The base of the appendix is normal. The body and tip of the appendix are surrounded by stranding (series 2, image 60, but I favor this is secondary to the ascending colon abnormality.  No dilated small bowel. Oral contrast has not reached the distal small bowel. Negative stomach except for small hiatal hernia. Negative duodenum.  A chronic hepatic steatosis and surgical absence of the gallbladder. Spleen, pancreas, adrenal glands, portal venous system, and major arterial structures in the abdomen and pelvis are within normal limits. No abdominal  free fluid or free air. No lymphadenopathy.  IMPRESSION: 1. Moderate to severe inflammation of the ascending colon, favor acute diverticulitis. No free fluid or free air. 2. There is inflammation surrounding the distal appendix, but I favor this is secondary. If the patient does not improve as expected then consider a repeat CT Abdomen and Pelvis with oral and IV contrast (e.g. In 12-24  hr). 3. Chronic hepatic steatosis.   Electronically Signed   By: Lars Pinks M.D.   On: 08/21/2014 11:57   Dg Abd Portable 2v  08/21/2014   CLINICAL DATA:  Abdominal pain with vomiting and diarrhea  EXAM: PORTABLE ABDOMEN - 2 VIEW  COMPARISON:  CT abdomen and pelvis obtained earlier in the day  FINDINGS: Supine and left lateral decubitus images were obtained. There is gas in the large bowel. There is a paucity of small bowel gas. Multiple air-fluid levels are identified. No bowel dilatation. No free air. There is a fixation rod for scoliosis in place in the lower thoracic and upper lumbar regions. There are surgical clips in the right upper quadrant region.  IMPRESSION: Relative paucity of small bowel gas with multiple air-fluid levels. Suspect enteritis or ileus is most likely, although a degree of obstruction could present in this manner. No free air.   Electronically Signed   By: Lowella Grip M.D.   On: 08/21/2014 19:50    Anti-infectives: Anti-infectives    Start     Dose/Rate Route Frequency Ordered Stop   08/22/14 0500  ciprofloxacin (CIPRO) IVPB 400 mg     400 mg200 mL/hr over 60 Minutes Intravenous Every 12 hours 08/21/14 1710     08/21/14 2330  metroNIDAZOLE (FLAGYL) IVPB 500 mg     500 mg100 mL/hr over 60 Minutes Intravenous Every 8 hours 08/21/14 1652     08/21/14 1430  ciprofloxacin (CIPRO) IVPB 400 mg  Status:  Discontinued     400 mg200 mL/hr over 60 Minutes Intravenous Every 12 hours 08/21/14 1429 08/21/14 1710   08/21/14 1430  metroNIDAZOLE (FLAGYL) IVPB 500 mg  Status:  Discontinued     500 mg100 mL/hr over 60 Minutes Intravenous Every 8 hours 08/21/14 1429 08/21/14 1652       Assessment/Plan  1. Abdominal pain, colitis, likely infectious 2. Nausea, possible ileus 3. Diarrhea  Plan: 1. Her WBC has normalized today on Cipro/Flagyl; however, her pain is not much better.  If this does not improve within the next 24 hours, we would recommend changing her abx to something  like zosyn.  Cont NPO and bowel rest. 2. Await c diff results, but she has no risk factors for it currently.  3. No acute indications for surgical intervention.  Cont conservative measures  LOS: 1 day    Kesia Dalto E 08/22/2014, 8:31 AM Pager: [phone]

## 2014-08-22 NOTE — Progress Notes (Signed)
Telephone order from [place], [place] to make patient NPO with sips with meds.

## 2014-08-23 LAB — BASIC METABOLIC PANEL
Anion gap: 5 (ref 5–15)
BUN: 9 mg/dL (ref 6–23)
CALCIUM: 8.1 mg/dL — AB (ref 8.4–10.5)
CHLORIDE: 104 meq/L (ref 96–112)
CO2: 26 mmol/L (ref 19–32)
Creatinine, Ser: 0.71 mg/dL (ref 0.50–1.10)
GFR calc Af Amer: >90 mL/min (ref >90)
GLUCOSE: 91 mg/dL (ref 70–99)
Potassium: 3.9 mmol/L (ref 3.5–5.1)
SODIUM: 135 mmol/L (ref 135–145)

## 2014-08-23 LAB — CBC
HCT: 35.6 % — ABNORMAL LOW (ref 36.0–46.0)
Hemoglobin: 11.8 g/dL — ABNORMAL LOW (ref 12.0–15.0)
MCH: 30.9 pg (ref 26.0–34.0)
MCHC: 33.1 g/dL (ref 30.0–36.0)
MCV: 93.2 fL (ref 78.0–100.0)
Platelets: 159 10*3/uL (ref 150–400)
RBC: 3.82 MIL/uL — ABNORMAL LOW (ref 3.87–5.11)
RDW: 13.7 % (ref 11.5–15.5)
WBC: 5.7 10*3/uL (ref 4.0–10.5)

## 2014-08-23 NOTE — Progress Notes (Signed)
Subjective: Feels better.  Less MSO4 overnight.  Diarrhea resolved.  Objective: Vital signs in last 24 hours: Temp:  [97.7 F (36.5 C)-98.5 F (36.9 C)] 97.7 F (36.5 C) (01/22 0508) Pulse Rate:  [55-59] 55 (01/22 0508) Resp:  [14-19] 18 (01/22 0508) BP: (97-108)/(51-61) 99/51 mmHg (01/22 0508) SpO2:  [90 %-93 %] 93 % (01/22 0508) Weight change:  Last BM Date: 08/22/14  CBG (last 3)  No results for input(s): GLUCAP in the last 72 hours.  Intake/Output from previous day: 01/21 0701 - 01/22 0700 In: 3213.3 [P.O.:20; I.V.:2193.3; IV Piggyback:1000] Out: 500 [Urine:500] Intake/Output this shift:    General appearance: initially asleep- awoke in mild discomfort; fatigued Eyes: no scleral icterus Throat: oropharynx moist without erythema Resp: clear to auscultation bilaterally Cardio: regular rate and rhythm GI: mildly distended with hypoactive bowel sounds; right lower quadrant>right upper>left sided abodominal tendnerness Extremities: no clubbing, cyanosis or edema   Lab Results:  Recent Labs  08/22/14 0635 08/23/14 0550  NA 137 135  K 4.1 3.9  CL 106 104  CO2 20 26  GLUCOSE 85 91  BUN 8 9  CREATININE 0.70 0.71  CALCIUM 8.3* 8.1*     Recent Labs  08/22/14 0635 08/23/14 0550  WBC 7.2 5.7  HGB 12.4 11.8*  HCT 37.2 35.6*  MCV 92.1 93.2  PLT 160 159    Studies/Results: Ct Abdomen Pelvis W Contrast  08/21/2014   CLINICAL DATA:  39 year old female with generalized abdominal pain for 2 days, now severe. Initial encounter.  EXAM: CT ABDOMEN AND PELVIS WITH CONTRAST  TECHNIQUE: Multidetector CT imaging of the abdomen and pelvis was performed using the standard protocol following bolus administration of intravenous contrast.  CONTRAST:  112m OMNIPAQUE IOHEXOL 300 MG/ML  SOLN  COMPARISON:  CT Abdomen and Pelvis 11/19/2010.  FINDINGS: Chronic scoliosis with posterior spinal rod hardware. Negative lung bases. No pericardial or pleural effusion.  No acute osseous  abnormality identified.  No pelvic free fluid. Negative uterus and adnexa. Diminutive and unremarkable bladder. Negative distal colon.  Sigmoid diverticula with no definite active inflammation. Diverticulosis continues into the splenic flexure and transverse colon.  Moderately to severely inflamed ascending colon with mesenteric stranding. Series 2, image 53). Epicenter just proximal to the splenic flexure. Diverticula in this region. The cecum appears relatively spared. The terminal ileum is within normal limits. There is a retrocecal appendix. The base of the appendix is normal. The body and tip of the appendix are surrounded by stranding (series 2, image 60, but I favor this is secondary to the ascending colon abnormality.  No dilated small bowel. Oral contrast has not reached the distal small bowel. Negative stomach except for small hiatal hernia. Negative duodenum.  A chronic hepatic steatosis and surgical absence of the gallbladder. Spleen, pancreas, adrenal glands, portal venous system, and major arterial structures in the abdomen and pelvis are within normal limits. No abdominal free fluid or free air. No lymphadenopathy.  IMPRESSION: 1. Moderate to severe inflammation of the ascending colon, favor acute diverticulitis. No free fluid or free air. 2. There is inflammation surrounding the distal appendix, but I favor this is secondary. If the patient does not improve as expected then consider a repeat CT Abdomen and Pelvis with oral and IV contrast (e.g. In 12-24 hr). 3. Chronic hepatic steatosis.   Electronically Signed   By: LLars PinksM.D.   On: 08/21/2014 11:57   Dg Abd Portable 2v  08/21/2014   CLINICAL DATA:  Abdominal pain with vomiting and  diarrhea  EXAM: PORTABLE ABDOMEN - 2 VIEW  COMPARISON:  CT abdomen and pelvis obtained earlier in the day  FINDINGS: Supine and left lateral decubitus images were obtained. There is gas in the large bowel. There is a paucity of small bowel gas. Multiple air-fluid  levels are identified. No bowel dilatation. No free air. There is a fixation rod for scoliosis in place in the lower thoracic and upper lumbar regions. There are surgical clips in the right upper quadrant region.  IMPRESSION: Relative paucity of small bowel gas with multiple air-fluid levels. Suspect enteritis or ileus is most likely, although a degree of obstruction could present in this manner. No free air.   Electronically Signed   By: Lowella Grip M.D.   On: 08/21/2014 19:50     Medications: Scheduled: . antiseptic oral rinse  7 mL Mouth Rinse q12n4p  . chlorhexidine  15 mL Mouth Rinse BID  . ciprofloxacin  400 mg Intravenous Q12H  . docusate sodium  100 mg Oral BID  . DULoxetine  60 mg Oral Daily  . enoxaparin (LOVENOX) injection  40 mg Subcutaneous Q24H  . metronidazole  500 mg Intravenous Q8H  . pantoprazole  40 mg Oral BID   Continuous: . sodium chloride 100 mL/hr at 08/23/14 0455    Assessment/Plan: Principal Problem: 1. Colitis, acute- appreciate surgery evaluation- clinical picture and CT findings consistent with acute colitis/diverticulitis, likely infectious. Seems a little better today.  Will coninue Cipro/Flagyl, IV fluids, IV narcotics. Will start clear liquids today and advance slowly as tolerated over the weekend. Active Problems: 2. Leukocytosis- resolved 3. Diarrhea- C.diff negative.  4. DVT prophylaxis- continue Lovenox. 5. Disposition- possible discharge to home in 1-2 days if improving and tolerating diet.    LOS: 2 days   Marton Redwood 08/23/2014, 8:33 AM

## 2014-08-23 NOTE — Progress Notes (Signed)
Subjective: Feels a little better today. +flatus. Nausea better. Still sore on right  Objective: Vital signs in last 24 hours: Temp:  [97.7 F (36.5 C)-98.5 F (36.9 C)] 97.7 F (36.5 C) (01/22 0508) Pulse Rate:  [55-59] 55 (01/22 0508) Resp:  [14-19] 18 (01/22 0508) BP: (97-108)/(51-61) 99/51 mmHg (01/22 0508) SpO2:  [90 %-93 %] 93 % (01/22 0508) Last BM Date: 08/22/14  Intake/Output from previous day: 01/21 0701 - 01/22 0700 In: 3213.3 [P.O.:20; I.V.:2193.3; IV Piggyback:1000] Out: 500 [Urine:500] Intake/Output this shift:    Alert, nontoxic, looks a little better today Obese, soft, mild RUQ, RT mid abd TTP; no guarding/peritonitis Good cap refill  Lab Results:   Recent Labs  08/22/14 0635 08/23/14 0550  WBC 7.2 5.7  HGB 12.4 11.8*  HCT 37.2 35.6*  PLT 160 159   BMET  Recent Labs  08/22/14 0635 08/23/14 0550  NA 137 135  K 4.1 3.9  CL 106 104  CO2 20 26  GLUCOSE 85 91  BUN 8 9  CREATININE 0.70 0.71  CALCIUM 8.3* 8.1*   PT/INR No results for input(s): LABPROT, INR in the last 72 hours. ABG No results for input(s): PHART, HCO3 in the last 72 hours.  Invalid input(s): PCO2, PO2  Studies/Results: Ct Abdomen Pelvis W Contrast  08/21/2014   CLINICAL DATA:  39 year old female with generalized abdominal pain for 2 days, now severe. Initial encounter.  EXAM: CT ABDOMEN AND PELVIS WITH CONTRAST  TECHNIQUE: Multidetector CT imaging of the abdomen and pelvis was performed using the standard protocol following bolus administration of intravenous contrast.  CONTRAST:  163m OMNIPAQUE IOHEXOL 300 MG/ML  SOLN  COMPARISON:  CT Abdomen and Pelvis 11/19/2010.  FINDINGS: Chronic scoliosis with posterior spinal rod hardware. Negative lung bases. No pericardial or pleural effusion.  No acute osseous abnormality identified.  No pelvic free fluid. Negative uterus and adnexa. Diminutive and unremarkable bladder. Negative distal colon.  Sigmoid diverticula with no definite  active inflammation. Diverticulosis continues into the splenic flexure and transverse colon.  Moderately to severely inflamed ascending colon with mesenteric stranding. Series 2, image 53). Epicenter just proximal to the splenic flexure. Diverticula in this region. The cecum appears relatively spared. The terminal ileum is within normal limits. There is a retrocecal appendix. The base of the appendix is normal. The body and tip of the appendix are surrounded by stranding (series 2, image 60, but I favor this is secondary to the ascending colon abnormality.  No dilated small bowel. Oral contrast has not reached the distal small bowel. Negative stomach except for small hiatal hernia. Negative duodenum.  A chronic hepatic steatosis and surgical absence of the gallbladder. Spleen, pancreas, adrenal glands, portal venous system, and major arterial structures in the abdomen and pelvis are within normal limits. No abdominal free fluid or free air. No lymphadenopathy.  IMPRESSION: 1. Moderate to severe inflammation of the ascending colon, favor acute diverticulitis. No free fluid or free air. 2. There is inflammation surrounding the distal appendix, but I favor this is secondary. If the patient does not improve as expected then consider a repeat CT Abdomen and Pelvis with oral and IV contrast (e.g. In 12-24 hr). 3. Chronic hepatic steatosis.   Electronically Signed   By: LLars PinksM.D.   On: 08/21/2014 11:57   Dg Abd Portable 2v  08/21/2014   CLINICAL DATA:  Abdominal pain with vomiting and diarrhea  EXAM: PORTABLE ABDOMEN - 2 VIEW  COMPARISON:  CT abdomen and pelvis obtained earlier in  the day  FINDINGS: Supine and left lateral decubitus images were obtained. There is gas in the large bowel. There is a paucity of small bowel gas. Multiple air-fluid levels are identified. No bowel dilatation. No free air. There is a fixation rod for scoliosis in place in the lower thoracic and upper lumbar regions. There are surgical  clips in the right upper quadrant region.  IMPRESSION: Relative paucity of small bowel gas with multiple air-fluid levels. Suspect enteritis or ileus is most likely, although a degree of obstruction could present in this manner. No free air.   Electronically Signed   By: Lowella Grip M.D.   On: 08/21/2014 19:50    Anti-infectives: Anti-infectives    Start     Dose/Rate Route Frequency Ordered Stop   08/22/14 0500  ciprofloxacin (CIPRO) IVPB 400 mg     400 mg200 mL/hr over 60 Minutes Intravenous Every 12 hours 08/21/14 1710     08/21/14 2330  metroNIDAZOLE (FLAGYL) IVPB 500 mg     500 mg100 mL/hr over 60 Minutes Intravenous Every 8 hours 08/21/14 1652     08/21/14 1430  ciprofloxacin (CIPRO) IVPB 400 mg  Status:  Discontinued     400 mg200 mL/hr over 60 Minutes Intravenous Every 12 hours 08/21/14 1429 08/21/14 1710   08/21/14 1430  metroNIDAZOLE (FLAGYL) IVPB 500 mg  Status:  Discontinued     500 mg100 mL/hr over 60 Minutes Intravenous Every 8 hours 08/21/14 1429 08/21/14 1652      Assessment/Plan: Ascending colon colitis  No fever. Wbc ok. Less pain. +flatus Clear liquids today - would not auto advance diet; monitor for tolerance to diet.  Ambulate  Leighton Ruff. Redmond Pulling, MD, FACS General, Bariatric, & Minimally Invasive Surgery [facility], [place]    LOS: 2 days    Gayland Curry 08/23/2014

## 2014-08-24 LAB — BASIC METABOLIC PANEL
Anion gap: 6 (ref 5–15)
BUN: 7 mg/dL (ref 6–23)
CHLORIDE: 107 mmol/L (ref 96–112)
CO2: 25 mmol/L (ref 19–32)
Calcium: 8.4 mg/dL (ref 8.4–10.5)
Creatinine, Ser: 0.84 mg/dL (ref 0.50–1.10)
GFR, EST NON AFRICAN AMERICAN: 86 mL/min — AB (ref >90)
Glucose, Bld: 81 mg/dL (ref 70–99)
POTASSIUM: 3.9 mmol/L (ref 3.5–5.1)
Sodium: 138 mmol/L (ref 135–145)

## 2014-08-24 LAB — CBC
HCT: 36.4 % (ref 36.0–46.0)
Hemoglobin: 12.2 g/dL (ref 12.0–15.0)
MCH: 30.6 pg (ref 26.0–34.0)
MCHC: 33.5 g/dL (ref 30.0–36.0)
MCV: 91.2 fL (ref 78.0–100.0)
PLATELETS: 170 10*3/uL (ref 150–400)
RBC: 3.99 MIL/uL (ref 3.87–5.11)
RDW: 13.2 % (ref 11.5–15.5)
WBC: 5.3 10*3/uL (ref 4.0–10.5)

## 2014-08-24 MED ORDER — HYDROCODONE-ACETAMINOPHEN 5-325 MG PO TABS: 1.0000 | ORAL_TABLET | ORAL | Status: DC | PRN

## 2014-08-24 MED ORDER — CIPROFLOXACIN HCL 500 MG PO TABS: 500.0000 mg | ORAL_TABLET | Freq: Two times a day (BID) | ORAL | Status: DC

## 2014-08-24 MED ORDER — METRONIDAZOLE 500 MG PO TABS: 500.0000 mg | ORAL_TABLET | Freq: Three times a day (TID) | ORAL | Status: DC

## 2014-08-24 NOTE — Progress Notes (Signed)
  Subjective: Feels fine, hungry tol clears, having bms less diarrheal  Objective: Vital signs in last 24 hours: Temp:  [98.2 F (36.8 C)-98.7 F (37.1 C)] 98.3 F (36.8 C) (01/23 0508) Pulse Rate:  [66-69] 69 (01/23 0508) Resp:  [12-16] 14 (01/23 0508) BP: (90-105)/(59-67) 97/59 mmHg (01/23 0508) SpO2:  [91 %-94 %] 92 % (01/23 0508) Last BM Date: 08/23/14  Intake/Output from previous day: 01/22 0701 - 01/23 0700 In: 1428.3 [P.O.:240; I.V.:1188.3] Out: -  Intake/Output this shift:    GI: soft no real tenderness nondistended  Lab Results:   Recent Labs  08/23/14 0550 08/24/14 0443  WBC 5.7 5.3  HGB 11.8* 12.2  HCT 35.6* 36.4  PLT 159 170   BMET  Recent Labs  08/23/14 0550 08/24/14 0443  NA 135 138  K 3.9 3.9  CL 104 107  CO2 26 25  GLUCOSE 91 81  BUN 9 7  CREATININE 0.71 0.84  CALCIUM 8.1* 8.4   PT/INR No results for input(s): LABPROT, INR in the last 72 hours. ABG No results for input(s): PHART, HCO3 in the last 72 hours.  Invalid input(s): PCO2, PO2  Studies/Results: No results found.  Anti-infectives: Anti-infectives    Start     Dose/Rate Route Frequency Ordered Stop   08/22/14 0500  ciprofloxacin (CIPRO) IVPB 400 mg     400 mg200 mL/hr over 60 Minutes Intravenous Every 12 hours 08/21/14 1710     08/21/14 2330  metroNIDAZOLE (FLAGYL) IVPB 500 mg     500 mg100 mL/hr over 60 Minutes Intravenous Every 8 hours 08/21/14 1652     08/21/14 1430  ciprofloxacin (CIPRO) IVPB 400 mg  Status:  Discontinued     400 mg200 mL/hr over 60 Minutes Intravenous Every 12 hours 08/21/14 1429 08/21/14 1710   08/21/14 1430  metroNIDAZOLE (FLAGYL) IVPB 500 mg  Status:  Discontinued     500 mg100 mL/hr over 60 Minutes Intravenous Every 8 hours 08/21/14 1429 08/21/14 1652      Assessment/Plan: Right sided colitis ? Diverticulitis  I think can advance diet to low residue, will ask ntn to see. If tolerates lunch can dc home on oral abx course Would recommend  colonoscopy in 6-8 weeks if completely resolves I recommended she not go on cruise later this week due to recurrence risk  [facility] 08/24/2014

## 2014-08-24 NOTE — Progress Notes (Signed)
NURSING PROGRESS NOTE  Meghan Wilcox [id number] Discharge Data: 08/24/2014 12:08 PM Attending Provider: Marton Redwood, MD HQN:ETUY, Gwyndolyn Saxon, MD   Barbaraann Boys to be D/C'd Home per MD order.    All IV's will be discontinued and monitored for bleeding.  All belongings will be returned to patient for patient to take home.  Last Documented Vital Signs:  Blood pressure 97/59, pulse 69, temperature 98.3 F (36.8 C), temperature source Oral, resp. rate 14, height 5' 2"  (1.575 m), weight 102.513 kg (226 lb), SpO2 92 %.  Joslyn Hy, MSN, RN, [employer]

## 2014-08-24 NOTE — Discharge Summary (Signed)
DISCHARGE SUMMARY  Meghan Wilcox  MR#: [id number]  DOB:01-Nov-1975  Date of Admission: 08/21/2014 Date of Discharge: 08/24/2014  Attending Physician:Meghan Wilcox  Patient's FKC:LEXN, Gwyndolyn Saxon, MD  Consults:Treatment Team:  Md Ccs, MD    Discharge Diagnoses: Principal Problem:   Colitis, acute, infectious Active Problems:   Leukocytosis   Diarrhea Anxiety/depression Insomnia Hepatic steatosis   Discharge Medications:   Medication List    STOP taking these medications        ibuprofen 200 MG tablet  Commonly known as:  ADVIL,MOTRIN      TAKE these medications        ALPRAZolam 0.25 MG tablet  Commonly known as:  XANAX  Take 0.25 mg by mouth at bedtime as needed for anxiety.     ciprofloxacin 500 MG tablet  Commonly known as:  CIPRO  Take 1 tablet (500 mg total) by mouth 2 (two) times daily.     DULoxetine 60 MG capsule  Commonly known as:  CYMBALTA  Take 60 mg by mouth daily.     HYDROcodone-acetaminophen 5-325 MG per tablet  Commonly known as:  NORCO/VICODIN  Take 1-2 tablets by mouth every 4 (four) hours as needed for moderate pain.     metroNIDAZOLE 500 MG tablet  Commonly known as:  FLAGYL  Take 1 tablet (500 mg total) by mouth 3 (three) times daily.     traMADol 50 MG tablet  Commonly known as:  ULTRAM  Take 50 mg by mouth every 6 (six) hours as needed for moderate pain.     zolpidem 10 MG tablet  Commonly known as:  AMBIEN  Take 0.5 tablets (5 mg total) by mouth at bedtime as needed.        Hospital Procedures: Ct Abdomen Pelvis W Contrast  08/21/2014   CLINICAL DATA:  39 year old female with generalized abdominal pain for 2 days, now severe. Initial encounter.  EXAM: CT ABDOMEN AND PELVIS WITH CONTRAST  TECHNIQUE: Multidetector CT imaging of the abdomen and pelvis was performed using the standard protocol following bolus administration of intravenous contrast.  CONTRAST:  143m OMNIPAQUE IOHEXOL 300 MG/ML  SOLN  COMPARISON:  CT Abdomen  and Pelvis 11/19/2010.  FINDINGS: Chronic scoliosis with posterior spinal rod hardware. Negative lung bases. No pericardial or pleural effusion.  No acute osseous abnormality identified.  No pelvic free fluid. Negative uterus and adnexa. Diminutive and unremarkable bladder. Negative distal colon.  Sigmoid diverticula with no definite active inflammation. Diverticulosis continues into the splenic flexure and transverse colon.  Moderately to severely inflamed ascending colon with mesenteric stranding. Series 2, image 53). Epicenter just proximal to the splenic flexure. Diverticula in this region. The cecum appears relatively spared. The terminal ileum is within normal limits. There is a retrocecal appendix. The base of the appendix is normal. The body and tip of the appendix are surrounded by stranding (series 2, image 60, but I favor this is secondary to the ascending colon abnormality.  No dilated small bowel. Oral contrast has not reached the distal small bowel. Negative stomach except for small hiatal hernia. Negative duodenum.  A chronic hepatic steatosis and surgical absence of the gallbladder. Spleen, pancreas, adrenal glands, portal venous system, and major arterial structures in the abdomen and pelvis are within normal limits. No abdominal free fluid or free air. No lymphadenopathy.  IMPRESSION: 1. Moderate to severe inflammation of the ascending colon, favor acute diverticulitis. No free fluid or free air. 2. There is inflammation surrounding the distal appendix, but I favor this is  secondary. If the patient does not improve as expected then consider a repeat CT Abdomen and Pelvis with oral and IV contrast (e.g. In 12-24 hr). 3. Chronic hepatic steatosis.   Electronically Signed   By: Lars Pinks M.D.   On: 08/21/2014 11:57   Dg Abd Portable 2v  08/21/2014   CLINICAL DATA:  Abdominal pain with vomiting and diarrhea  EXAM: PORTABLE ABDOMEN - 2 VIEW  COMPARISON:  CT abdomen and pelvis obtained earlier in the  day  FINDINGS: Supine and left lateral decubitus images were obtained. There is gas in the large bowel. There is a paucity of small bowel gas. Multiple air-fluid levels are identified. No bowel dilatation. No free air. There is a fixation rod for scoliosis in place in the lower thoracic and upper lumbar regions. There are surgical clips in the right upper quadrant region.  IMPRESSION: Relative paucity of small bowel gas with multiple air-fluid levels. Suspect enteritis or ileus is most likely, although a degree of obstruction could present in this manner. No free air.   Electronically Signed   By: Lowella Grip M.D.   On: 08/21/2014 19:50      History of Present Illness: abd pain and diarrhea  Hospital Course: 39 YO WF with no past GI hx presented with abdominal pain with N/V/D. Initially felt to have diverticulits and [facility] was consulted. Treated with Cipro and Flagyl IV and has done well. Working dx now is infectious colitis. Diet is being advanced and pt is feeling much better. No f/c/s. CBC and electrolytes are doing well. No CP or SOB. Tmax 99 Plans are to give 7 more days of cipro and flagyl (now PO) and follow up with GI to decide if colonscopy is needed.  Has clear liquid diet at bedside but had a Sausage McGriddle and sweet tea and did well  Day of Discharge Exam BP 97/59 mmHg  Pulse 69  Temp(Src) 98.3 F (36.8 C) (Oral)  Resp 14  Ht 5' 2"  (1.575 m)  Wt 102.513 kg (226 lb)  BMI 41.33 kg/m2  SpO2 92%  Physical Exam: General appearance: lying in bed, nontoxic Eyes: no scleral icterus, sl reddened Throat: oropharynx moist without erythema Resp: clear no wheeze Cardio: regular sl fast GI: obese soft, non-tender; bowel sounds normal; no masses,  no organomegaly Extremities: no clubbing, cyanosis or edema Awake, mentating well, in good spirits  Discharge Labs:  Recent Labs  08/23/14 0550 08/24/14 0443  NA 135 138  K 3.9 3.9  CL 104 107  CO2 26 25  GLUCOSE 91 81  BUN  9 7  CREATININE 0.71 0.84  CALCIUM 8.1* 8.4     Recent Labs  08/23/14 0550 08/24/14 0443  WBC 5.7 5.3  HGB 11.8* 12.2  HCT 35.6* 36.4  MCV 93.2 91.2  PLT 159 170        Discharge instructions: eat a fairly bland diet. Watch for any blood in your BM's. Call if fever or severe pain occurs. Avoid all alcohol while on metronidazole/flagyl   Disposition: to home  Follow-up Appts: Follow-up with Dr. Brigitte Pulse at [facility] in one week  Call for appointment.  Condition on Discharge: improved  Tests Needing Follow-up: none  Signed: Sena Clouatre Wilcox 08/24/2014, 11:15 AM

## 2014-09-20 ENCOUNTER — Ambulatory Visit (HOSPITAL_COMMUNITY)
Admission: RE | Admit: 2014-09-20 | Discharge: 2014-09-20 | Disposition: A | Discharge status: Home / Self Care | Payer: BLUE CROSS/BLUE SHIELD | Source: Ambulatory Visit | Attending: Internal Medicine | Admitting: Internal Medicine

## 2014-09-20 ENCOUNTER — Encounter: Payer: Self-pay | Admitting: Internal Medicine

## 2014-09-20 ENCOUNTER — Ambulatory Visit (INDEPENDENT_AMBULATORY_CARE_PROVIDER_SITE_OTHER): Payer: BLUE CROSS/BLUE SHIELD | Admitting: Internal Medicine

## 2014-09-20 ENCOUNTER — Other Ambulatory Visit: Payer: Self-pay | Admitting: Internal Medicine

## 2014-09-20 ENCOUNTER — Telehealth: Payer: Self-pay | Admitting: Internal Medicine

## 2014-09-20 VITALS — BP 102/78 | HR 80 | Ht 64.0 in | Wt 227.0 lb

## 2014-09-20 DIAGNOSIS — K5732 Diverticulitis of large intestine without perforation or abscess without bleeding: Secondary | ICD-10-CM

## 2014-09-20 DIAGNOSIS — R1031 Right lower quadrant pain: Secondary | ICD-10-CM | POA: Insufficient documentation

## 2014-09-20 MED ORDER — CIPROFLOXACIN HCL 500 MG PO TABS: 500.0000 mg | ORAL_TABLET | Freq: Two times a day (BID) | ORAL | Status: DC

## 2014-09-20 MED ORDER — METRONIDAZOLE 500 MG PO TABS: 500.0000 mg | ORAL_TABLET | Freq: Two times a day (BID) | ORAL | Status: DC

## 2014-09-20 MED ORDER — IOHEXOL 300 MG/ML  SOLN
100.0000 mL | Freq: Once | INTRAMUSCULAR | Status: AC | PRN
  Administered 2014-09-20: 100 mL via INTRAVENOUS

## 2014-09-20 NOTE — Telephone Encounter (Signed)
Pt scheduled to see Dr. Henrene Pastor at 2pm and arrive at office at 1:45pm. Pt aware.

## 2014-09-20 NOTE — Progress Notes (Signed)
HISTORY OF PRESENT ILLNESS:  Meghan Wilcox is a 39 y.o. female with biopsy-proven NASH who was last seen in this office October 2012 regarding GERD and gastroparesis. She is status post cholecystectomy. Patient contacted the office today complaining of abdominal pain and requesting antibiotics. This after being hospitalized last month with acute abdominal pain and leukocytosis. CT scan revealed moderate to severe inflammation in the ascending colon. Diverticulitis favored. She was hospitalized for 3 days then discharged home on ciprofloxacin and Flagyl for 1 week. After completing antibiotics her pain had resolved. She was doing well until 2 days ago when she developed progressive abdominal distention and recurrent right sided abdominal discomfort. Similar to her previous presentation, but not as severe. Also felt constipated for which she took an enema last night. No improvement in discomfort after defecation. She denies fevers or bleeding. Her hospital record, imaging, and laboratories have been reviewed.  REVIEW OF SYSTEMS:  All non-GI ROS negative except for back pain, fatigue  Past Medical History  Diagnosis Date  . Hepatomegaly   . Scoliosis   . History of ovarian cyst   . Family history of malignant neoplasm of gastrointestinal tract   . Gastroparesis   . Esophagitis   . Seizures     post part  eclampsia  13yr ago  . GERD (gastroesophageal reflux disease)   . Blood transfusion age [age]yr   Antibody scewwn neg. 08/20/10 prior to cholecystectomy  . Depression   . Anxiety     Past Surgical History  Procedure Laterality Date  . Cholecystectomy  jan 2012    Dr AmRonnald Collum. Wisdom tooth extraction    . Steel rod in her back  age [id number]  scoliosis  . Knee surgery Left   . Inner ear surgery Left   . Tubal ligation    . Tonsillectomy    . Bladder suspension  10/01/2011    Procedure: TRANSVAGINAL TAPE (TVT) PROCEDURE;  Surgeon: ToClarene DukeMD;  [facility];  Service:  Gynecology;  Laterality: N/A;  Solyx Sling  . Laparoscopy  10/01/2011    Procedure: LAPAROSCOPY OPERATIVE;  Surgeon: ToClarene DukeMD;  [facility];  Service: Gynecology;  Laterality: N/A;  removal of right fimbria, fulgeration of endometriosis  . Cystoscopy  10/01/2011    Procedure: CYSTOSCOPY;  Surgeon: ToClarene DukeMD;  [facility];  Service: Gynecology;  Laterality: N/A;    Social History JeLAKERA VIALLreports that she quit smoking about 6 weeks ago. Her smoking use included Cigarettes. She has a 12.5 pack-year smoking history. She has never used smokeless tobacco. She reports that she drinks alcohol. She reports that she does not use illicit drugs.  family history includes Cirrhosis in her father and mother; Colon cancer in her paternal grandfather; Crohn's disease in her mother; Diabetes in her brother, father, and mother; Heart disease in her father, mother, and mother; Irritable bowel syndrome in her mother; Liver disease in her father; Lung cancer in her mother.  No Known Allergies     PHYSICAL EXAMINATION: Vital signs: BP 102/78 mmHg  Pulse 80  Ht 5' 4"  (1.626 m)  Wt 227 lb (102.967 kg)  BMI 38.95 kg/m2  Constitutional: Obese but generally well-appearing, no acute distress Psychiatric: alert and oriented x3, cooperative Eyes: extraocular movements intact, anicteric, conjunctiva pink Mouth: oral pharynx moist, no lesions Neck: supple no lymphadenopathy Cardiovascular: heart regular rate and rhythm, no murmur Lungs: clear to auscultation bilaterally Abdomen: soft,  moderate tenderness in the right midabdomen, nondistended, no obvious ascites, no peritoneal signs, normal bowel sounds, no organomegaly Rectal: Ommitted Extremities: no lower extremity edema bilaterally Skin: no clinically relevant lesions on visible extremities. Tattoos present Neuro: No focal deficits.   ASSESSMENT:  #1. Recurrent right-sided abdominal pain. Recent hospitalization with  inflammation of the right colon suggesting diverticulitis. Rule out recurrence of process. May be diverticulitis, but could be another process.  PLAN:  #1. Contrast-enhanced CT scan of the abdomen and pelvis today. Radiology has been asked to call my cell phone with this report. #2. Begin ciprofloxacin 500 mg twice a day and metronidazole 500 mg twice a day 2 weeks #3. Follow-up to be determined based on CT scan finding and clinical progress. At some point will need colonoscopy as previously recommended by surgery. I did instruct her to contact the on-call physician or proceed to the hospital with her symptoms worsen.

## 2014-09-20 NOTE — Telephone Encounter (Signed)
Pt was recently in the hospital in January for diverticulitis/gastritis. Pt was given IV antibiotics and pain meds. Pt states that she has finished her PO antibiotics and that Wednesday she started having some bloating again. States that now she is hurting on her left side and in a lot of pain. States she is very swollen and her bowels are not moving much. Pt is afraid she will wind up back in the hospital if she doesn't get started back on antibiotics. There are no APP appts available today. Dr. Henrene Pastor please advise.

## 2014-09-20 NOTE — Telephone Encounter (Signed)
I have not seen in > 3 years. Work her onto my schedule today at 2 pm. Show up at 1:45

## 2014-09-20 NOTE — Patient Instructions (Signed)
We have sent the following medications to your pharmacy for you to pick up at your convenience:  Cipro and Flagyl  You have been scheduled for a CT scan of the abdomen and pelvis at [facility] are scheduled on 09/24/2014 at 4:30pm. You should arrive 15 minutes prior to your appointment time for registration. Please follow the written instructions below on the day of your exam:  WARNING: IF YOU ARE ALLERGIC TO IODINE/X-RAY DYE, PLEASE NOTIFY RADIOLOGY IMMEDIATELY AT [phone]! YOU WILL BE GIVEN A 13 HOUR PREMEDICATION PREP.  1) Do not eat or drink anything after 12:30pm (4 hours prior to your test) 2) You have been given 2 bottles of oral contrast to drink. The solution may taste better if refrigerated, but do NOT add ice or any other liquid to this solution. Shake well before drinking.    Drink 1 bottle of contrast @ 2:30pm (2 hours prior to your exam)  Drink 1 bottle of contrast @ 3:30pm (1 hour prior to your exam)  You may take any medications as prescribed with a small amount of water except for the following: Metformin, Glucophage, Glucovance, Avandamet, Riomet, Fortamet, Actoplus Met, Janumet, Glumetza or Metaglip. The above medications must be held the day of the exam AND 48 hours after the exam.  The purpose of you drinking the oral contrast is to aid in the visualization of your intestinal tract. The contrast solution may cause some diarrhea. Before your exam is started, you will be given a small amount of fluid to drink. Depending on your individual set of symptoms, you may also receive an intravenous injection of x-ray contrast/dye. Plan on being at [facility] for 30 minutes or long, depending on the type of exam you are having performed.   ________________________________________________________________________

## 2014-09-24 ENCOUNTER — Other Ambulatory Visit: Payer: Self-pay | Admitting: *Deleted

## 2014-09-24 ENCOUNTER — Ambulatory Visit (HOSPITAL_COMMUNITY): Payer: BLUE CROSS/BLUE SHIELD

## 2014-09-24 DIAGNOSIS — R1031 Right lower quadrant pain: Secondary | ICD-10-CM

## 2014-09-25 ENCOUNTER — Ambulatory Visit (AMBULATORY_SURGERY_CENTER): Payer: Self-pay | Admitting: *Deleted

## 2014-09-25 VITALS — Ht 64.0 in | Wt 228.0 lb

## 2014-09-25 DIAGNOSIS — R1031 Right lower quadrant pain: Secondary | ICD-10-CM

## 2014-09-25 MED ORDER — MOVIPREP 100 G PO SOLR: ORAL | Status: DC

## 2014-09-25 NOTE — Progress Notes (Signed)
Patient denies any allergies to eggs or soy. Patient denies any problems with anesthesia/sedation. Patient denies any oxygen use at home and does not take any diet/weight loss medications. EMMI education assisgned to patient on colonoscopy, this was explained and instructions given to patient. 

## 2014-10-02 ENCOUNTER — Encounter: Payer: Self-pay | Admitting: Internal Medicine

## 2014-10-02 ENCOUNTER — Ambulatory Visit (AMBULATORY_SURGERY_CENTER): Payer: BLUE CROSS/BLUE SHIELD | Admitting: Internal Medicine

## 2014-10-02 VITALS — BP 104/70 | HR 65 | Temp 98.9°F | Resp 38 | Ht 64.0 in | Wt 228.0 lb

## 2014-10-02 DIAGNOSIS — D122 Benign neoplasm of ascending colon: Secondary | ICD-10-CM

## 2014-10-02 DIAGNOSIS — R935 Abnormal findings on diagnostic imaging of other abdominal regions, including retroperitoneum: Secondary | ICD-10-CM

## 2014-10-02 DIAGNOSIS — R1031 Right lower quadrant pain: Secondary | ICD-10-CM

## 2014-10-02 LAB — HM COLONOSCOPY

## 2014-10-02 MED ORDER — SODIUM CHLORIDE 0.9 % IV SOLN: 500.0000 mL | INTRAVENOUS | Status: DC

## 2014-10-02 NOTE — Patient Instructions (Signed)
YOU HAD AN ENDOSCOPIC PROCEDURE TODAY AT [facility]:   Refer to the procedure report that was given to you for any specific questions about what was found during the examination.  If the procedure report does not answer your questions, please call your gastroenterologist to clarify.  If you requested that your care partner not be given the details of your procedure findings, then the procedure report has been included in a sealed envelope for you to review at your convenience later.  YOU SHOULD EXPECT: Some feelings of bloating in the abdomen. Passage of more gas than usual.  Walking can help get rid of the air that was put into your GI tract during the procedure and reduce the bloating. If you had a lower endoscopy (such as a colonoscopy or flexible sigmoidoscopy) you may notice spotting of blood in your stool or on the toilet paper. If you underwent a bowel prep for your procedure, you may not have a normal bowel movement for a few days.  Please Note:  You might notice some irritation and congestion in your nose or some drainage.  This is from the oxygen used during your procedure.  There is no need for concern and it should clear up in a day or so.  SYMPTOMS TO REPORT IMMEDIATELY:   Following lower endoscopy (colonoscopy or flexible sigmoidoscopy):  Excessive amounts of blood in the stool  Significant tenderness or worsening of abdominal pains  Swelling of the abdomen that is new, acute  Fever of 100F or higher   A gastroenterologist can be reached at any hour by calling [phone].   DIET: Your first meal following the procedure should be a small meal and then it is ok to progress to your normal diet. Heavy or fried foods are harder to digest and may make you feel nauseous or bloated.  Likewise, meals heavy in dairy and vegetables can increase bloating.  Drink plenty of fluids but you should avoid alcoholic beverages for 24 hours.  ACTIVITY:  You should plan to take it  easy for the rest of today and you should NOT DRIVE or use heavy machinery until tomorrow (because of the sedation medicines used during the test).    FOLLOW UP: Our staff will call the number listed on your records the next business day following your procedure to check on you and address any questions or concerns that you may have regarding the information given to you following your procedure. If we do not reach you, we will leave a message.  However, if you are feeling well and you are not experiencing any problems, there is no need to return our call.  We will assume that you have returned to your regular daily activities without incident.  If any biopsies were taken you will be contacted by phone or by letter within the next 1-3 weeks.  Please call us at [phone] if you have not heard about the biopsies in 3 weeks.    SIGNATURES/CONFIDENTIALITY: You and/or your care partner have signed paperwork which will be entered into your electronic medical record.  These signatures attest to the fact that that the information above on your After Visit Summary has been reviewed and is understood.  Full responsibility of the confidentiality of this discharge information lies with you and/or your care-partner.  Recommendations Next colonoscopy in 3 years. Discharge instructions given to patient and/or care partner. Polyp, diverticulosis, and high fiber diet handouts provided.

## 2014-10-02 NOTE — Progress Notes (Signed)
Report to PACU, RN, vss, BBS= Clear.  

## 2014-10-02 NOTE — Op Note (Signed)
[place]  [address] [place], [address]   COLONOSCOPY PROCEDURE REPORT  PATIENT: Meghan Wilcox, Meghan Wilcox  MR#: [id number] BIRTHDATE: 12-Nov-1975 , 38  yrs. old GENDER: female ENDOSCOPIST: Eustace Quail, MD REFERRED BY:W.  Lutricia Feil, M.D. PROCEDURE DATE:  10/02/2014 PROCEDURE:   Colonoscopy with snare polypectomy x 4 First Screening Colonoscopy - Avg.  risk and is 50 yrs.  old or older - No.  Prior Negative Screening - Now for repeat screening. N/A  History of Adenoma - Now for follow-up colonoscopy & has been > or = to 3 yrs.  N/A  Polyps Removed Today? Yes. ASA CLASS:   Class II INDICATIONS:an abnormal CT and abdominal pain in the lower right quadrant. MEDICATIONS: Monitored anesthesia care and Propofol 400 mg IV  DESCRIPTION OF PROCEDURE:   After the risks benefits and alternatives of the procedure were thoroughly explained, informed consent was obtained.  The digital rectal exam revealed no abnormalities of the rectum.   The LB [id number]  endoscope was introduced through the anus and advanced to the cecum, which was identified by both the appendix and ileocecal valve. No adverse events experienced.   The quality of the prep was excellent, using MoviPrep  The instrument was then slowly withdrawn as the colon was fully examined.  COLON FINDINGS: The examined terminal ileum appeared to be normal. Four polyps ranging between 3-72m in size were found in the ascending colon.  A polypectomy was performed with a cold snare. The resection was complete, the polyp tissue was completely retrieved and sent to histology.   There was moderate diverticulosis noted throughout the entire examined colon.   The examination was otherwise normal.  Retroflexed views revealed internal hemorrhoids. The time to cecum=1 minutes 39 seconds. Withdrawal time=9 minutes 42 seconds.  The scope was withdrawn and the procedure completed. COMPLICATIONS: There were no immediate  complications.  ENDOSCOPIC IMPRESSION: 1.   The examined terminal ileum appeared to be normal 2.   Four polyps were found in the ascending colon; polypectomy was performed with a cold snare 3.   Moderate diverticulosis was noted throughout the entire examined colon . No active diverticulitis (resolved) 4.   The examination was otherwise normal  RECOMMENDATIONS: 1. Repeat Colonoscopy in 3 years (due to multiple polyps).  eSigned:  JEustace Quail MD 10/02/2014 1:58 PM   cc: WJanalyn Rouse MD and The Patient

## 2014-10-02 NOTE — Progress Notes (Signed)
Called to room to assist during endoscopic procedure.  Patient ID and intended procedure confirmed with present staff. Received instructions for my participation in the procedure from the performing physician.  

## 2014-10-03 ENCOUNTER — Telehealth: Payer: Self-pay | Admitting: *Deleted

## 2014-10-03 NOTE — Telephone Encounter (Signed)
Left message on f/u callback

## 2014-10-08 ENCOUNTER — Encounter: Payer: Self-pay | Admitting: Internal Medicine

## 2014-11-12 ENCOUNTER — Ambulatory Visit: Payer: BC Managed Care – PPO | Admitting: Internal Medicine

## 2015-02-11 ENCOUNTER — Ambulatory Visit (INDEPENDENT_AMBULATORY_CARE_PROVIDER_SITE_OTHER): Payer: BLUE CROSS/BLUE SHIELD | Admitting: Podiatry

## 2015-02-11 ENCOUNTER — Encounter: Payer: Self-pay | Admitting: Podiatry

## 2015-02-11 VITALS — BP 109/77 | HR 75 | Ht 64.0 in | Wt 227.0 lb

## 2015-02-11 DIAGNOSIS — M216X9 Other acquired deformities of unspecified foot: Secondary | ICD-10-CM | POA: Diagnosis not present

## 2015-02-11 DIAGNOSIS — M21961 Unspecified acquired deformity of right lower leg: Secondary | ICD-10-CM | POA: Insufficient documentation

## 2015-02-11 DIAGNOSIS — M722 Plantar fascial fibromatosis: Secondary | ICD-10-CM | POA: Diagnosis not present

## 2015-02-11 NOTE — Patient Instructions (Signed)
Seen for right heel pain. Injection given. Night Splint dispensed. Return for Custom orthotics.

## 2015-02-11 NOTE — Progress Notes (Signed)
Subjective: 39 year old female presents complaining of bad case of plantar fasciitis x 2 months right heel. Cannot put any pressure, pain shoots up to back of leg. 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 Wearing flat rubber shoes.   Review of Systems - Negative.  Objective: Neurovascular status are within normal. Dermatologic: No abnormal skin lesions. Orthopedic: High arched cavus type foot. Tight Achilles tendon both feet. Elevated first ray right. Pain in right heel.  Assessment: Plantar fasciitis right. 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  Plan: Reviewed clinical findings and available treatment options. Injected right heel with mixture of 4 mg Dexamethasone, 4 mg Triamcinolone, and 1 cc of 0.5% Marcaine plain.  Patient tolerated well without difficulty.  Dispensed Night Splint with instruction. Instructed to do stretch exercise. Discussed proper shoe gears.  Return for custom orthotics.

## 2015-08-04 ENCOUNTER — Telehealth: Payer: Self-pay | Admitting: Gastroenterology

## 2015-08-04 ENCOUNTER — Encounter (HOSPITAL_COMMUNITY): Payer: Self-pay | Admitting: Emergency Medicine

## 2015-08-04 ENCOUNTER — Emergency Department (HOSPITAL_COMMUNITY)
Admission: EM | Admit: 2015-08-04 | Discharge: 2015-08-04 | Disposition: A | Discharge status: Home / Self Care | Payer: BLUE CROSS/BLUE SHIELD | Attending: Emergency Medicine | Admitting: Emergency Medicine

## 2015-08-04 DIAGNOSIS — R110 Nausea: Secondary | ICD-10-CM | POA: Diagnosis not present

## 2015-08-04 DIAGNOSIS — Z8719 Personal history of other diseases of the digestive system: Secondary | ICD-10-CM | POA: Diagnosis not present

## 2015-08-04 DIAGNOSIS — R109 Unspecified abdominal pain: Secondary | ICD-10-CM

## 2015-08-04 DIAGNOSIS — F419 Anxiety disorder, unspecified: Secondary | ICD-10-CM | POA: Diagnosis not present

## 2015-08-04 DIAGNOSIS — Z9851 Tubal ligation status: Secondary | ICD-10-CM | POA: Diagnosis not present

## 2015-08-04 DIAGNOSIS — R1032 Left lower quadrant pain: Secondary | ICD-10-CM | POA: Diagnosis present

## 2015-08-04 DIAGNOSIS — Z9049 Acquired absence of other specified parts of digestive tract: Secondary | ICD-10-CM | POA: Insufficient documentation

## 2015-08-04 DIAGNOSIS — Z8742 Personal history of other diseases of the female genital tract: Secondary | ICD-10-CM | POA: Diagnosis not present

## 2015-08-04 DIAGNOSIS — F329 Major depressive disorder, single episode, unspecified: Secondary | ICD-10-CM | POA: Diagnosis not present

## 2015-08-04 DIAGNOSIS — Z79899 Other long term (current) drug therapy: Secondary | ICD-10-CM | POA: Diagnosis not present

## 2015-08-04 DIAGNOSIS — F17210 Nicotine dependence, cigarettes, uncomplicated: Secondary | ICD-10-CM | POA: Diagnosis not present

## 2015-08-04 DIAGNOSIS — M419 Scoliosis, unspecified: Secondary | ICD-10-CM | POA: Insufficient documentation

## 2015-08-04 HISTORY — DX: Diverticulitis of intestine, part unspecified, without perforation or abscess without bleeding: K57.92

## 2015-08-04 LAB — CBC
HCT: 44 % (ref 36.0–46.0)
Hemoglobin: 14.5 g/dL (ref 12.0–15.0)
MCH: 31.2 pg (ref 26.0–34.0)
MCHC: 33 g/dL (ref 30.0–36.0)
MCV: 94.6 fL (ref 78.0–100.0)
PLATELETS: 202 10*3/uL (ref 150–400)
RBC: 4.65 MIL/uL (ref 3.87–5.11)
RDW: 14 % (ref 11.5–15.5)
WBC: 9.8 10*3/uL (ref 4.0–10.5)

## 2015-08-04 LAB — URINE MICROSCOPIC-ADD ON: RBC / HPF: NONE SEEN RBC/hpf (ref 0–5)

## 2015-08-04 LAB — URINALYSIS, ROUTINE W REFLEX MICROSCOPIC
Bilirubin Urine: NEGATIVE
GLUCOSE, UA: NEGATIVE mg/dL
Hgb urine dipstick: NEGATIVE
Ketones, ur: NEGATIVE mg/dL
Nitrite: NEGATIVE
PROTEIN: NEGATIVE mg/dL
Specific Gravity, Urine: 1.025 (ref 1.005–1.030)
pH: 5.5 (ref 5.0–8.0)

## 2015-08-04 LAB — COMPREHENSIVE METABOLIC PANEL
ALT: 67 U/L — AB (ref 14–54)
AST: 56 U/L — ABNORMAL HIGH (ref 15–41)
Albumin: 3.8 g/dL (ref 3.5–5.0)
Alkaline Phosphatase: 69 U/L (ref 38–126)
Anion gap: 7 (ref 5–15)
BILIRUBIN TOTAL: 0.6 mg/dL (ref 0.3–1.2)
BUN: 11 mg/dL (ref 6–20)
CHLORIDE: 106 mmol/L (ref 101–111)
CO2: 25 mmol/L (ref 22–32)
Calcium: 8.9 mg/dL (ref 8.9–10.3)
Creatinine, Ser: 0.71 mg/dL (ref 0.44–1.00)
GFR calc Af Amer: >60 mL/min (ref >60)
GFR calc non Af Amer: >60 mL/min (ref >60)
GLUCOSE: 107 mg/dL — AB (ref 65–99)
POTASSIUM: 4 mmol/L (ref 3.5–5.1)
Sodium: 138 mmol/L (ref 135–145)
TOTAL PROTEIN: 7.6 g/dL (ref 6.5–8.1)

## 2015-08-04 MED ORDER — HYDROMORPHONE HCL 1 MG/ML IJ SOLN
1.0000 mg | Freq: Once | INTRAMUSCULAR | Status: AC
  Administered 2015-08-04: 1 mg via INTRAVENOUS
  Filled 2015-08-04: qty 1

## 2015-08-04 MED ORDER — METOCLOPRAMIDE HCL 5 MG/ML IJ SOLN
10.0000 mg | INTRAMUSCULAR | Status: AC
  Administered 2015-08-04: 10 mg via INTRAVENOUS
  Filled 2015-08-04: qty 2

## 2015-08-04 MED ORDER — SODIUM CHLORIDE 0.9 % IV BOLUS (SEPSIS)
1000.0000 mL | Freq: Once | INTRAVENOUS | Status: AC
  Administered 2015-08-04: 1000 mL via INTRAVENOUS

## 2015-08-04 MED ORDER — OXYCODONE-ACETAMINOPHEN 5-325 MG PO TABS: 1.0000 | ORAL_TABLET | ORAL | Status: DC | PRN

## 2015-08-04 MED ORDER — DOCUSATE SODIUM 100 MG PO CAPS: 100.0000 mg | ORAL_CAPSULE | Freq: Two times a day (BID) | ORAL | Status: DC

## 2015-08-04 MED ORDER — CIPROFLOXACIN HCL 500 MG PO TABS: 500.0000 mg | ORAL_TABLET | Freq: Two times a day (BID) | ORAL | Status: DC

## 2015-08-04 MED ORDER — ONDANSETRON 4 MG PO TBDP: 4.0000 mg | ORAL_TABLET | Freq: Three times a day (TID) | ORAL | Status: DC | PRN

## 2015-08-04 MED ORDER — MORPHINE SULFATE (PF) 4 MG/ML IV SOLN
4.0000 mg | Freq: Once | INTRAVENOUS | Status: AC
Start: 2015-08-04 — End: 2015-08-04
  Administered 2015-08-04: 4 mg via INTRAVENOUS
  Filled 2015-08-04: qty 1

## 2015-08-04 MED ORDER — ONDANSETRON HCL 4 MG/2ML IJ SOLN
4.0000 mg | Freq: Once | INTRAMUSCULAR | Status: AC
  Administered 2015-08-04: 4 mg via INTRAVENOUS
  Filled 2015-08-04: qty 2

## 2015-08-04 MED ORDER — METRONIDAZOLE 500 MG PO TABS: 500.0000 mg | ORAL_TABLET | Freq: Two times a day (BID) | ORAL | Status: DC

## 2015-08-04 NOTE — Telephone Encounter (Signed)
The patient called this evening complaining of severe abdominal pain. She has a history of diverticulitis which she has had a few times within the past few years, one episode hospitalizing her early 2016. She had a follow up colonoscopy by Dr. Henrene Pastor showing moderate diverticulosis and some colon polyps that were removed in Feb 2016  She reports some abdominal pain which started a few days ago and has now progressed as of earlier this evening. Pain is severe, she cannot get comfortable and is writhing in pain. She reports her bowels have changed and having less stool output and change in stool form. She thinks these symptoms are similar to her symptoms when she previously was admitted with diverticulitis. She reports she is on her way to the ER and I agree with having an evaluation at this hour to make sure she doesn't have complicated diverticulitis and for pain control, as she may need to be admitted for either one of these. She agreed, ER can call us with any questions about her case.

## 2015-08-04 NOTE — ED Notes (Signed)
Pt's O2 sat noted to be 90% on RA prior to administration of morphine.  2L O2 via [place] applied w/ an improvement in O2 sats to 96%.

## 2015-08-04 NOTE — ED Notes (Signed)
Pt states she has diverticulitis and is having a flare up  Pt states it has been bothering her for a couple days  Pt is c/o pain in her left lower quadrant  Pt states she is not passing blood  Pt states she is passing only small amts of stool  Pt has nausea without vomiting

## 2015-08-04 NOTE — Discharge Instructions (Signed)
Take the prescribed medication as directed.  Use caution when taking pain medication-- it can make you drowsy.  Also may cause constipation as we discussed, recommend to take colace when taking pain medication. Follow-up with your GI physician-- call for appt later this week. Return to the ED for new or worsening symptoms.

## 2015-08-04 NOTE — ED Provider Notes (Signed)
CSN: [id number]     Arrival date & time 08/04/15  0227 History   First MD Initiated Contact with Patient 08/04/15 [phone]     Chief Complaint  Patient presents with  . Abdominal Pain     (Consider location/radiation/quality/duration/timing/severity/associated sxs/prior Treatment) The history is provided by the patient and medical records.    40 y.o. F with hx of scoliosis, gastroparesis, seizures, GERD, depression, anxiety, diverticulitis, presenting to the ED for abdominal pain.  Patient states for the past several days she's been having left lower abdominal pain which she describes as cramping. She states she has had some nausea without vomiting. She has been able to have small bowel movements, no diarrhea, melena, or hematochezia. Patient states she had a bout of diverticulitis last year with very similar symptoms. She states that episode was treated with antibiotics without noted complications. She is followed by GI, Dr. Henrene Pastor.  Denies any other associated symptoms-- no chest pain, SOB, palpitations, dizziness, weakness, fever, or chills.  VSS.  Past Medical History  Diagnosis Date  . Hepatomegaly   . Scoliosis   . History of ovarian cyst   . Family history of malignant neoplasm of gastrointestinal tract   . Gastroparesis   . Esophagitis   . Seizures ([place])     post part  eclampsia  56yr ago  . GERD (gastroesophageal reflux disease)   . Blood transfusion age [age]yr   Antibody scewwn neg. 08/20/10 prior to cholecystectomy  . Depression   . Anxiety   . Diverticulitis    Past Surgical History  Procedure Laterality Date  . Cholecystectomy  jan 2012    Dr AmRonnald Collum. Wisdom tooth extraction    . Steel rod in her back  age [id number]  scoliosis  . Knee surgery Left   . Inner ear surgery Left   . Tubal ligation    . Tonsillectomy    . Bladder suspension  10/01/2011    Procedure: TRANSVAGINAL TAPE (TVT) PROCEDURE;  Surgeon: ToClarene DukeMD;  [facility];  Service: Gynecology;   Laterality: N/A;  Solyx Sling  . Laparoscopy  10/01/2011    Procedure: LAPAROSCOPY OPERATIVE;  Surgeon: ToClarene DukeMD;  [facility];  Service: Gynecology;  Laterality: N/A;  removal of right fimbria, fulgeration of endometriosis  . Cystoscopy  10/01/2011    Procedure: CYSTOSCOPY;  Surgeon: ToClarene DukeMD;  [facility];  Service: Gynecology;  Laterality: N/A;   Family History  Problem Relation Age of Onset  . Diabetes Mother   . Cirrhosis Mother   . Irritable bowel syndrome Mother   . Crohn's disease Mother   . Lung cancer Mother   . Heart disease Mother   . Diabetes Father   . Heart disease Father   . Liver disease Father   . Cirrhosis Father   . Diabetes Brother   . Colon cancer Paternal Grandfather [id number]. Esophageal cancer Neg Hx   . Rectal cancer Neg Hx   . Stomach cancer Neg Hx    Social History  Substance Use Topics  . Smoking status: Current Every Day Smoker -- 0.50 packs/day for 25 years    Types: Cigarettes  . Smokeless tobacco: Never Used  . Alcohol Use: 0.0 oz/week    0 Standard drinks or equivalent per week     Comment: occasional   OB History    Gravida Para Term Preterm AB TAB SAB Ectopic Multiple Living   5 3  3       3     Review of Systems  Gastrointestinal: Positive for nausea and abdominal pain.  All other systems reviewed and are negative.     Allergies  Review of patient's allergies indicates no known allergies.  Home Medications   Prior to Admission medications   Medication Sig Start Date End Date Taking? Authorizing Provider  ALPRAZolam Duanne Moron) 0.25 MG tablet Take 0.25 mg by mouth at bedtime as needed for anxiety.   Yes Historical Provider, MD  buPROPion (WELLBUTRIN XL) 150 MG 24 hr tablet Take 150 mg by mouth daily. 07/25/15  Yes Historical Provider, MD  metFORMIN (GLUCOPHAGE) 500 MG tablet Take 500 mg by mouth 2 (two) times daily with a meal. 07/26/15  Yes Historical Provider, MD  zolpidem (AMBIEN) 10 MG tablet Take 0.5  tablets (5 mg total) by mouth at bedtime as needed. 04/01/14  Yes Charolette Forward, MD  DULoxetine (CYMBALTA) 60 MG capsule Take 60 mg by mouth daily.  11/06/13   Historical Provider, MD  HYDROcodone-acetaminophen (NORCO/VICODIN) 5-325 MG per tablet Take 1-2 tablets by mouth every 4 (four) hours as needed for moderate pain. Patient not taking: Reported on 08/04/2015 08/24/14   Reynold Bowen, MD   BP 114/75 mmHg  Pulse 91  Temp(Src) 98.5 F (36.9 C) (Oral)  Resp 16  SpO2 95%   Physical Exam  Constitutional: She is oriented to person, place, and time. She appears well-developed and well-nourished. No distress.  HENT:  Head: Normocephalic and atraumatic.  Mouth/Throat: Oropharynx is clear and moist.  Eyes: Conjunctivae and EOM are normal. Pupils are equal, round, and reactive to light.  Neck: Normal range of motion. Neck supple.  Cardiovascular: Normal rate, regular rhythm and normal heart sounds.   Pulmonary/Chest: Effort normal and breath sounds normal. No respiratory distress. She has no wheezes.  Abdominal: Soft. Bowel sounds are normal. There is tenderness in the left lower quadrant. There is no rigidity, no guarding and no CVA tenderness.  Obese abdomen, TTP in LLQ without rebound or guarding  Musculoskeletal: Normal range of motion. She exhibits no edema.  Neurological: She is alert and oriented to person, place, and time.  Skin: Skin is warm and dry. She is not diaphoretic.  Psychiatric: She has a normal mood and affect.  Nursing note and vitals reviewed.   ED Course  Procedures (including critical care time) Labs Review Labs Reviewed  COMPREHENSIVE METABOLIC PANEL - Abnormal; Notable for the following:    Glucose, Bld 107 (*)    AST 56 (*)    ALT 67 (*)    All other components within normal limits  URINALYSIS, ROUTINE W REFLEX MICROSCOPIC (NOT AT [facility]) - Abnormal; Notable for the following:    APPearance CLOUDY (*)    Leukocytes, UA SMALL (*)    All other components within  normal limits  URINE MICROSCOPIC-ADD ON - Abnormal; Notable for the following:    Squamous Epithelial / LPF 6-30 (*)    Bacteria, UA FEW (*)    All other components within normal limits  CBC    Imaging Review No results found. I have personally reviewed and evaluated these images and lab results as part of my medical decision-making.   EKG Interpretation None      MDM   Final diagnoses:  Abdominal pain, unspecified abdominal location   40 year old female here with abdominal pain. She states this feels like her prior episodes of diverticulitis. Patient is afebrile, nontoxic. She does have tenderness in her left  lower quadrant without peritonitis. Her bowel sounds are normal.  Labwork is overall reassuring, no leukocytosis. UA appears contaminated.  Patient clinically with likely repeat episode of diverticulitis. I've discussed with patient risks versus benefit of repeat CT scan, she is comfortable holding on repeat scan and treating empirically at this time. Patient was treated in the ED with morphine and Dilaudid, states pain is much better. She did get briefly hypoxic after the pain medication, however continues to deny any chest pain or shortness of breath. She briefly required O2 immediately after morphine, however was taken off soon after and VS remained stable on RA.  Patient has been able to tolerate PO here in ED.  Feel she is appropriate for OP management.  Will start on cipro, flagyl, percocet, zofran.  FU with GI.  Discussed plan with patient, he/she acknowledged understanding and agreed with plan of care.  Return precautions given for new or worsening symptoms.   Larene Pickett, PA-C 08/04/15 1012  April Palumbo, MD 08/06/15 [phone]

## 2015-08-28 IMAGING — CT CT ANGIO CHEST
2 of 8 series · 19 of 46 positions shown · IV contrast (Omni 300)
Comparison: 04/16/2013

CLINICAL DATA: sob

EXAM:
CT ANGIOGRAPHY CHEST WITH CONTRAST
TECHNIQUE: Multidetector CT imaging of the chest was performed using the
standard protocol during bolus administration of intravenous
contrast. Multiplanar CT image reconstructions and MIPs were
obtained to evaluate the vascular anatomy.
CONTRAST:  53mL OMNIPAQUE IOHEXOL 350 MG/ML SOLN

[Series 5: thins · axial · 0.62mm/px · z∈[+1098,+1328]mm · 16 of 254 slices shown]
[im 12/254  lung]
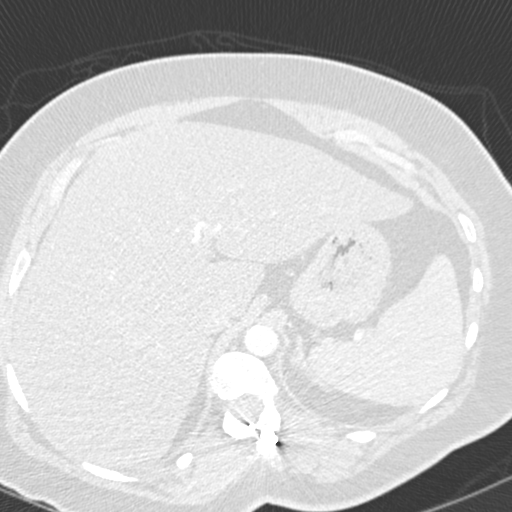
[im 24/254  soft-tissue]
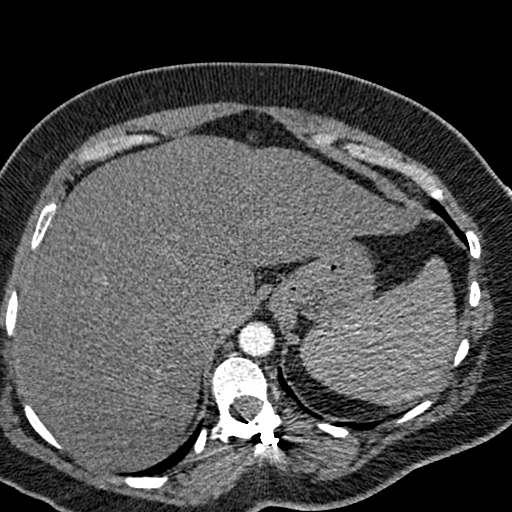
[im 47/254  lung]
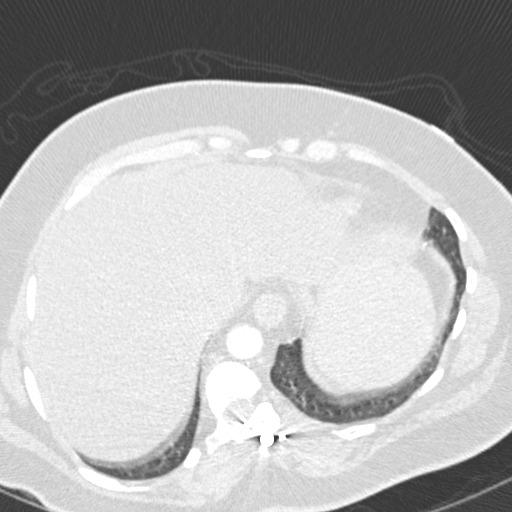
[im 58/254  soft-tissue]
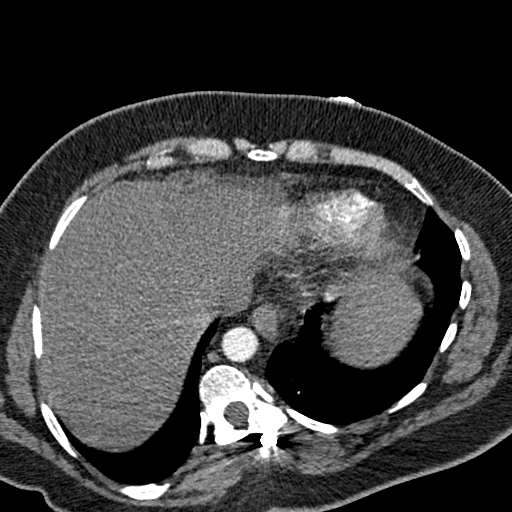
[im 70/254  lung]
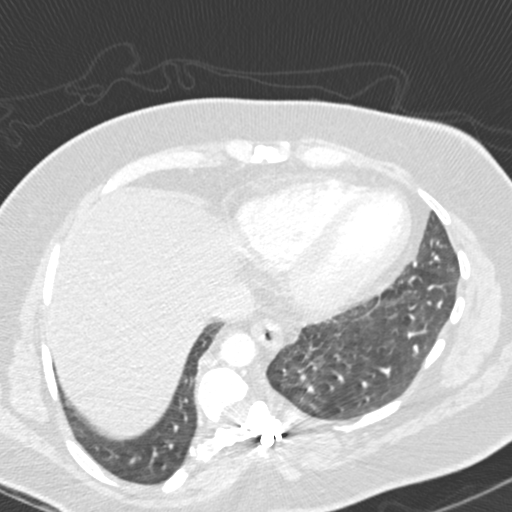
[im 93/254  soft-tissue]
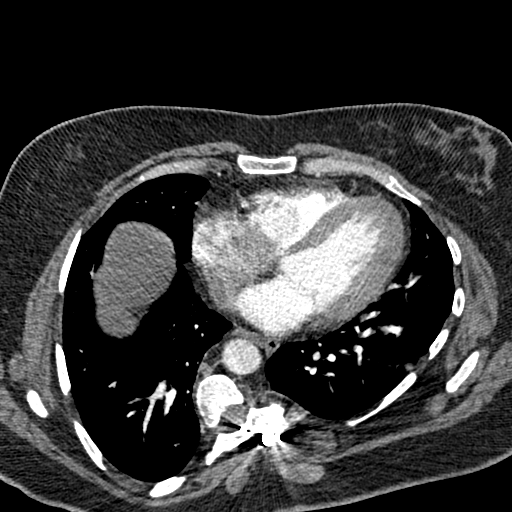
[im 104/254  lung]
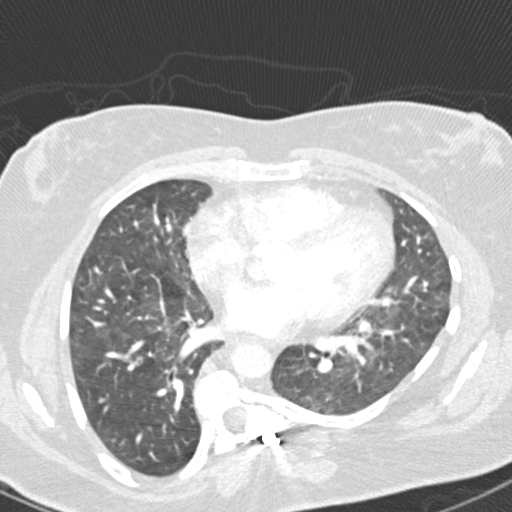
[im 116/254  soft-tissue]
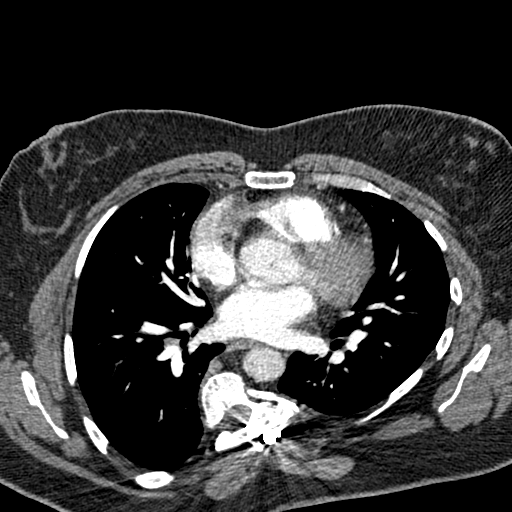
[im 139/254  lung]
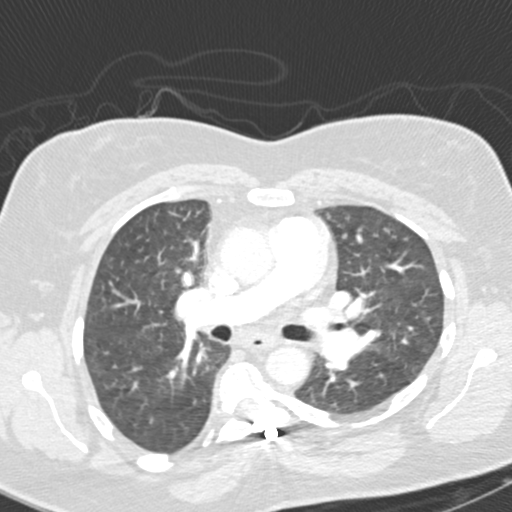
[im 150/254  soft-tissue]
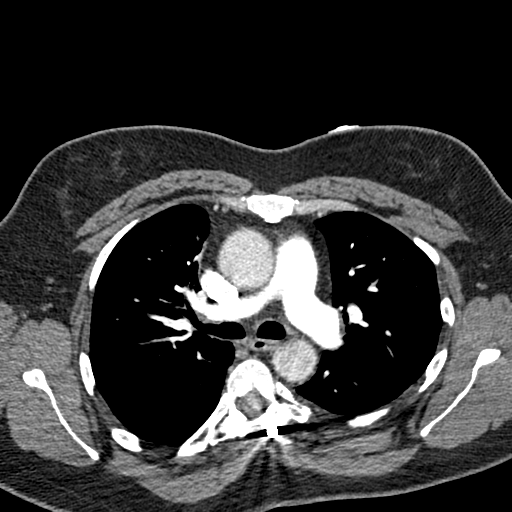
[im 162/254  lung]
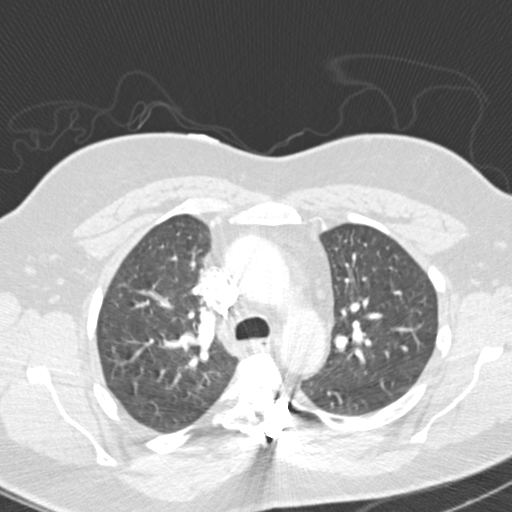
[im 185/254  soft-tissue]
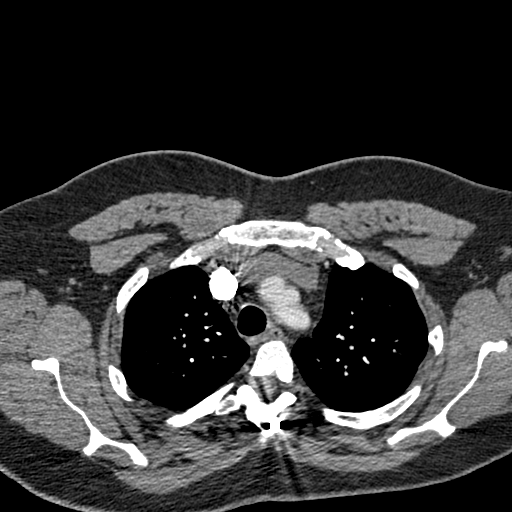
[im 196/254  lung]
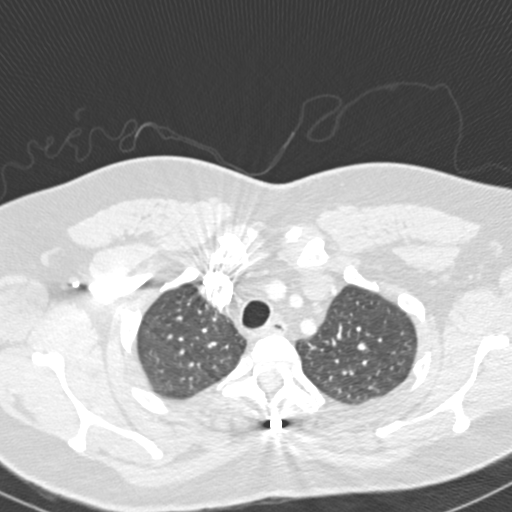
[im 208/254  soft-tissue]
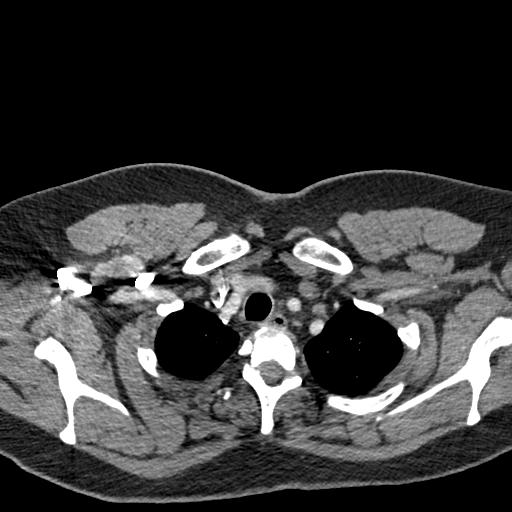
[im 231/254  lung]
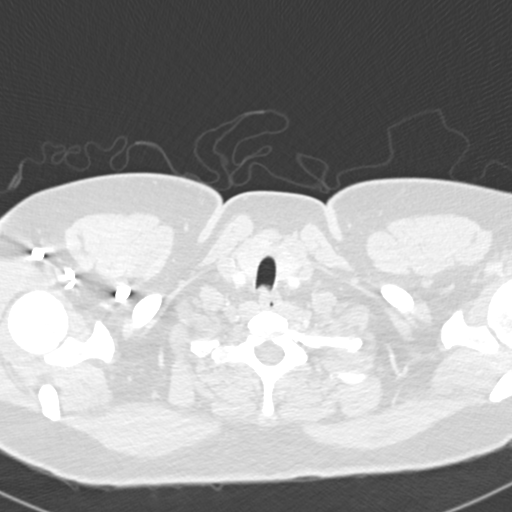
[im 242/254  soft-tissue]
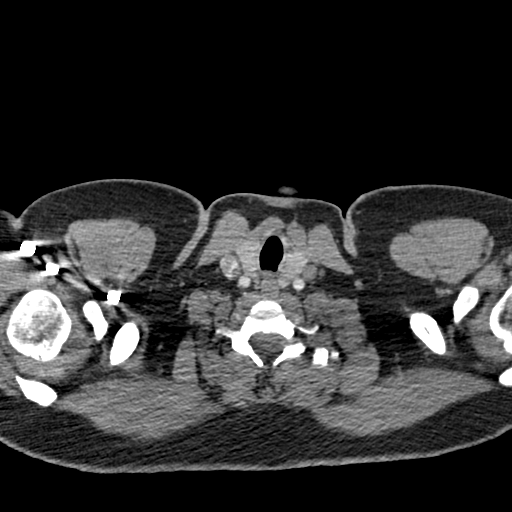

[Series 7: coronal mpr · coronal · 0.54mm/px · 3 of 107 slices shown]
[im 27/107  soft-tissue]
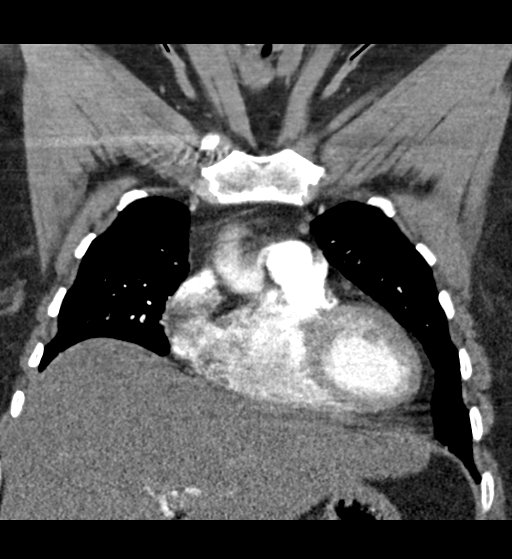
[im 54/107  soft-tissue]
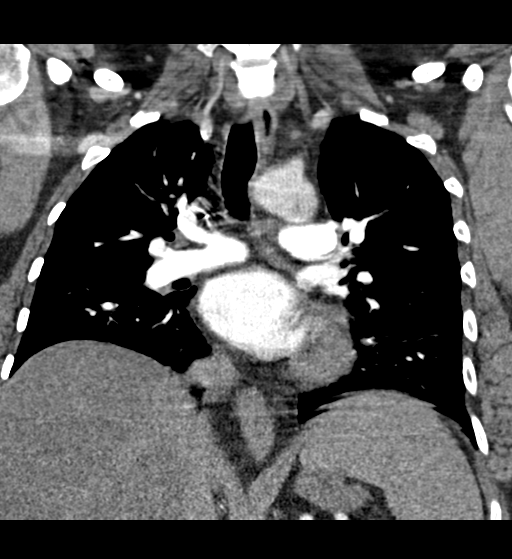
[im 80/107  soft-tissue]
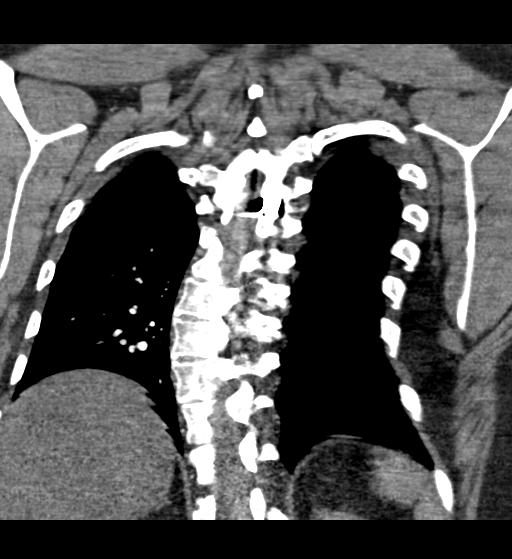

[19 of 46 positions shown; findings below may reference images not displayed]

FINDINGS: Satisfactory opacification of pulmonary arteries noted, and there is
no evidence of pulmonary emboli. Adequate contrast opacification of
the thoracic aorta with no evidence of dissection, aneurysm, or
stenosis. There is bovine variant brachiocephalic arch anatomy
without proximal stenosis. Coronary calcification noted. No pleural
or pericardial effusion. No hilar or mediastinal adenopathy.
Thoracic extra rotoscoliosis with left-sided Harrington rod, distal
extent not visualized, and multiple laminar wires . Solid-appearing
fusion across posterior elements T3-T12. Linear scarring or
platelike atelectasis in the lateral left lower lobe. Lungs are
otherwise clear. Visualized portions of upper abdomen unremarkable.

Review of the MIP images confirms the above findings.
IMPRESSION: 1. Negative for acute PE or thoracic aortic dissection.
2. Atherosclerosis, including coronary artery disease. Please note
that although the presence of coronary artery calcium documents the
presence of coronary artery disease, the severity of this disease
and any potential stenosis cannot be assessed on this non-gated CT
examination. Assessment for potential risk factor modification,
dietary therapy or pharmacologic therapy may be warranted, if
clinically indicated.
3. Solid-appearing instrumented thoracic fusion.

## 2015-08-28 IMAGING — CR DG CHEST 2V
2 series · 2 of 2 positions shown · non-contrast
Comparison: CT 04/16/2013 and earlier studies

CLINICAL DATA: CHEST PAIN

EXAM:
CHEST - 2 VIEW

[w chest pa]
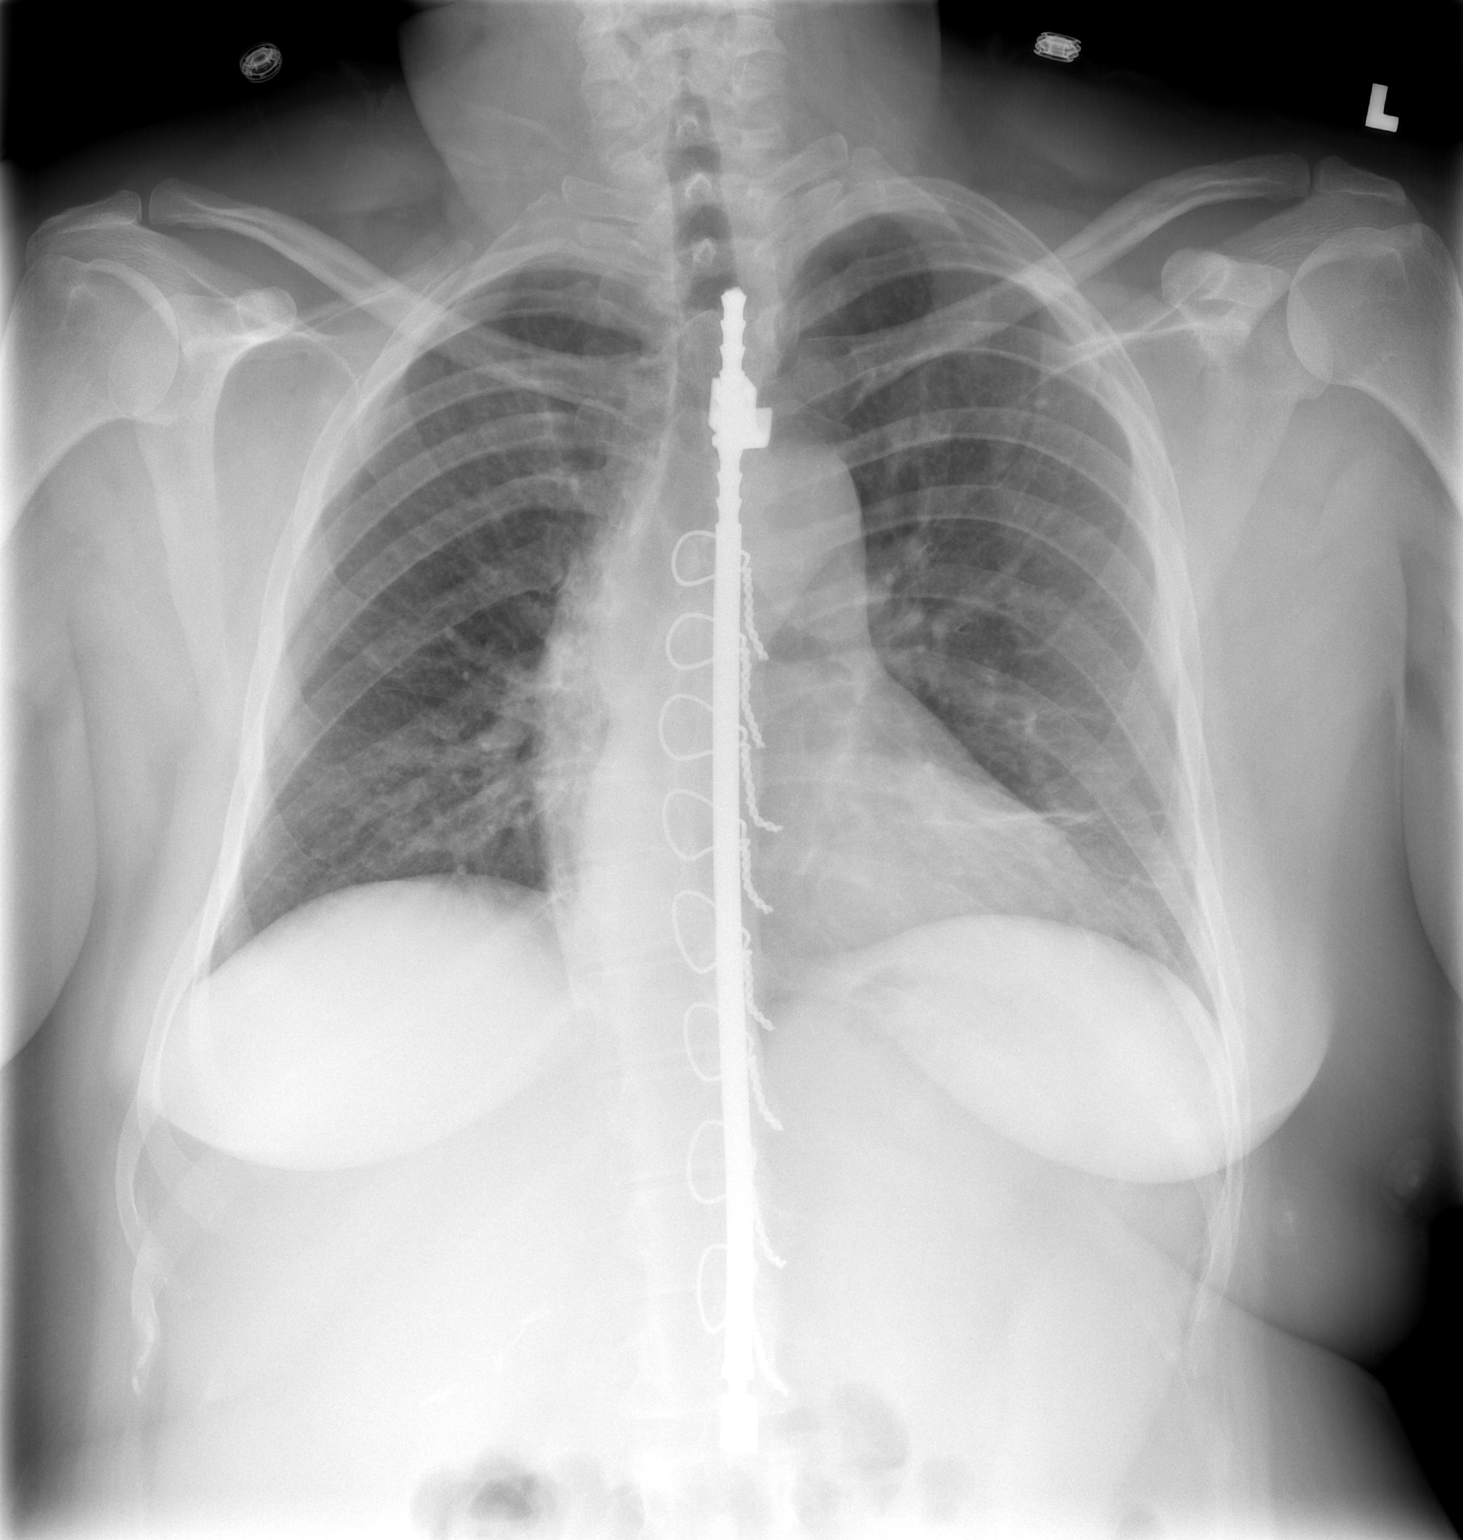

[w chest lat]
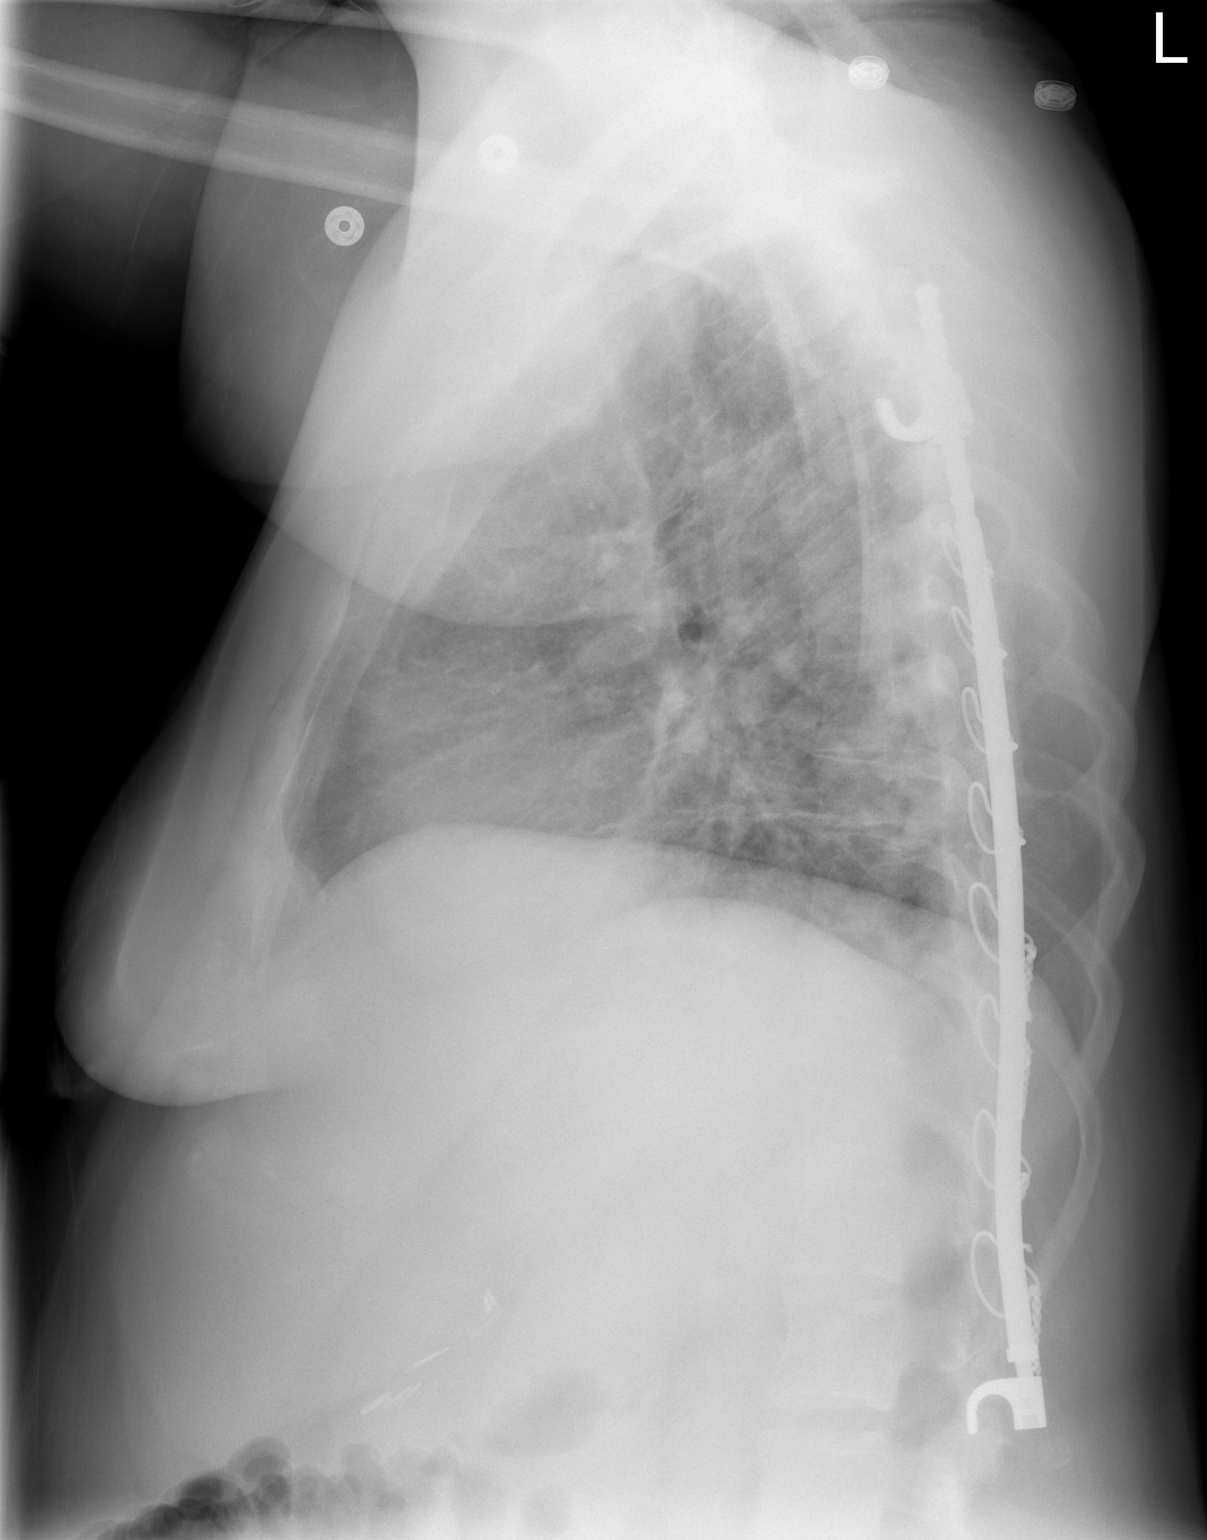

[2 of 2 positions shown; findings below may reference images not displayed]

FINDINGS: Linear scarring or subsegmental atelectasis, left lower lobe. Right
lung clear. Heart size normal. Heart size and mediastinal contours
are within normal limits.
No effusion.  No pneumothorax.
Thoracolumbar left unilateral Harrington rod and laminar wires
intact. Surgical clips in the right upper abdomen.
IMPRESSION: 1. Linear scarring or subsegmental atelectasis, left lower lobe.
2. Stable postoperative changes.

## 2015-09-10 ENCOUNTER — Ambulatory Visit (INDEPENDENT_AMBULATORY_CARE_PROVIDER_SITE_OTHER): Payer: BLUE CROSS/BLUE SHIELD | Admitting: Internal Medicine

## 2015-09-10 ENCOUNTER — Encounter: Payer: Self-pay | Admitting: Internal Medicine

## 2015-09-10 VITALS — BP 94/68 | HR 76 | Ht 64.0 in | Wt 227.0 lb

## 2015-09-10 DIAGNOSIS — K429 Umbilical hernia without obstruction or gangrene: Secondary | ICD-10-CM

## 2015-09-10 DIAGNOSIS — K7581 Nonalcoholic steatohepatitis (NASH): Secondary | ICD-10-CM

## 2015-09-10 DIAGNOSIS — R7989 Other specified abnormal findings of blood chemistry: Secondary | ICD-10-CM

## 2015-09-10 DIAGNOSIS — K2100 Gastro-esophageal reflux disease with esophagitis, without bleeding: Secondary | ICD-10-CM

## 2015-09-10 DIAGNOSIS — K5732 Diverticulitis of large intestine without perforation or abscess without bleeding: Secondary | ICD-10-CM | POA: Diagnosis not present

## 2015-09-10 DIAGNOSIS — R945 Abnormal results of liver function studies: Secondary | ICD-10-CM

## 2015-09-10 NOTE — Progress Notes (Signed)
HISTORY OF PRESENT ILLNESS:  Meghan Wilcox is a 40 y.o. female biopsy-proven Meghan Wilcox, GERD, gastroparesis, and prior cholecystectomy. Last seen 10/02/2014 for right lower quadrant pain and abnormal CT scan. She underwent complete colonoscopy with intubation of the terminal ileum. The ileum was normal. 4 subcentimeter polyps were removed (tubular adenoma and SSP) area follow-up in 3 years recommended she was also noted to have moderate pandiverticulosis. No active diverticulitis (which had resolved. She was in her usual state of health until a August 02, 2014 when she developed cramping lower abdominal discomfort most prominent in the left lower quadrant. Associated with this was more constipated bowels. She did take a laxative which resulted in incomplete relief. She was also evaluated in the emergency room 08/04/2015. I have reviewed that evaluation. CBC was normal. Comprehensive metabolic panel revealed mild elevation of hepatic transaminases. No imaging was performed. She was treated with pain medication as well as ciprofloxacin and metronidazole. She states that within 1 week her symptoms had resolved. She does take Colace and laxatives on demand. One daily fiber tablet. She reports that her bowel habits are in small pieces not well formed. No bleeding. No active GI complaints at this time. She has an appointment with general surgery this week regarding umbilical hernia repair  REVIEW OF SYSTEMS:  All non-GI ROS negative except for anxiety/depression, back pain, fatigue  Past Medical History  Diagnosis Date  . Hepatomegaly   . Scoliosis   . History of ovarian cyst   . Family history of malignant neoplasm of gastrointestinal tract   . Gastroparesis   . Esophagitis   . Seizures ([place])     post part  eclampsia  8yr ago  . GERD (gastroesophageal reflux disease)   . Blood transfusion age [age]yr   Antibody scewwn neg. 08/20/10 prior to cholecystectomy  . Depression   . Anxiety   . Diverticulitis    . Fatty liver   . Hiatal hernia     Past Surgical History  Procedure Laterality Date  . Cholecystectomy  jan 2012    Dr AmRonnald Collum. Wisdom tooth extraction    . Steel rod in her back  age [id number]  scoliosis  . Knee surgery Left   . Inner ear surgery Left   . Tubal ligation    . Tonsillectomy    . Bladder suspension  10/01/2011    Procedure: TRANSVAGINAL TAPE (TVT) PROCEDURE;  Surgeon: Meghan Wilcox;  [facility];  Service: Gynecology;  Laterality: N/A;  Solyx Sling  . Laparoscopy  10/01/2011    Procedure: LAPAROSCOPY OPERATIVE;  Surgeon: Meghan Wilcox;  [facility];  Service: Gynecology;  Laterality: N/A;  removal of right fimbria, fulgeration of endometriosis  . Cystoscopy  10/01/2011    Procedure: CYSTOSCOPY;  Surgeon: Meghan Wilcox;  [facility];  Service: Gynecology;  Laterality: N/A;    Social History Meghan PETERSENreports that she has been smoking Cigarettes.  She has a 12.5 pack-year smoking history. She has never used smokeless tobacco. She reports that she drinks alcohol. She reports that she does not use illicit drugs.  family history includes Cirrhosis in her father and mother; Colon cancer (age of onset: 7054in her paternal grandfather; Crohn's disease in her mother; Diabetes in her brother, father, and mother; Heart disease in her father and mother; Irritable bowel syndrome in her mother; Liver disease in her father; Lung cancer in her mother. There is no history  of Esophageal cancer, Rectal cancer, or Stomach cancer.  No Known Allergies     PHYSICAL EXAMINATION: Vital signs: BP 94/68 mmHg  Wilcox 75  Ht 5' 4"  (1.626 m)  Wt 227 lb (102.967 kg)  BMI 38.95 kg/m2  Constitutional: Obese, but generally well-appearing, no acute distress Psychiatric: alert and oriented x3, cooperative Eyes: extraocular movements intact, anicteric, conjunctiva pink Mouth: oral pharynx moist, no lesions Neck: supple no lymphadenopathy Cardiovascular:  heart regular rate and rhythm, no murmur Lungs: clear to auscultation bilaterally Abdomen: soft, obese, nontender, nondistended, no obvious ascites, no peritoneal signs, normal bowel sounds, no organomegaly. Umbilical hernia Rectal: Ommitted Extremities: No Clubbing cyanosis or lower extremity edema bilaterally Skin: no relevant lesions on visible extremities Neuro: No focal deficits.   ASSESSMENT:  #1. Recent episode of acute diverticulitis. Uncomplicated. Resolved after appropriate medical therapy #2. Umbilical hernia. Symptoms. For surgical assessment this week #3. History of multiple adenomatous polyps March 2016 #4. GERD with endoscopic evidence of esophagitis 2012 #5. NASH. Biopsy-proven. Cause for elevated liver tests #6. Morbid obesity #7. Status post cholecystectomy   PLAN:  #1. Discussion on diverticulitis #2. Recommended increasing fiber supplementation and keeping bowels regulated #3. Keep surgical appointment #4. Reflux precautions with attention to weight loss #5. Acid suppressive medication as needed to control reflux symptoms #6. Exercise and weight loss for Meghan Wilcox. She understands possible progression to cirrhosis as previously discussed #7. Surveillance colonoscopy around March 2019 #8. Resume general medical care with Dr. Brigitte Wilcox #9. Interval GI follow-up as needed  25 minutes was spent face-to-face with the patient. Greater than 50% of the time used for counseling regarding her multiple GI diagnoses

## 2015-09-10 NOTE — Patient Instructions (Signed)
Please follow up with Dr. Perry as needed 

## 2016-01-01 ENCOUNTER — Ambulatory Visit: Payer: BLUE CROSS/BLUE SHIELD | Admitting: Podiatry

## 2016-01-05 ENCOUNTER — Ambulatory Visit (INDEPENDENT_AMBULATORY_CARE_PROVIDER_SITE_OTHER): Payer: BLUE CROSS/BLUE SHIELD | Admitting: Podiatry

## 2016-01-05 ENCOUNTER — Ambulatory Visit (INDEPENDENT_AMBULATORY_CARE_PROVIDER_SITE_OTHER): Payer: BLUE CROSS/BLUE SHIELD

## 2016-01-05 DIAGNOSIS — R52 Pain, unspecified: Secondary | ICD-10-CM | POA: Diagnosis not present

## 2016-01-05 DIAGNOSIS — M7661 Achilles tendinitis, right leg: Secondary | ICD-10-CM

## 2016-01-05 NOTE — Patient Instructions (Signed)

## 2016-01-06 MED ORDER — NONFORMULARY OR COMPOUNDED ITEM: Status: DC

## 2016-01-09 NOTE — Progress Notes (Signed)
Subjective:     Patient ID: Meghan Wilcox, female   DOB: 08/17/1975, 40 y.o.   MRN: [id number]  HPI 40 year old female presents the also concerns of right heel pain to the back of her heel which is been ongoing for 6 months or more. She states that she has pain after standing for periods of time when the morning she first gets up. She does have a history of plantar fasciitis. No recent injury or trauma. No swelling or redness. No tingling or numbness. No other complaints.  Review of Systems  All other systems reviewed and are negative.      Objective:   Physical Exam General: AAO x3, NAD  Dermatological: Skin is warm, dry and supple bilateral. Nails x 10 are well manicured; remaining integument appears unremarkable at this time. There are no open sores, no preulcerative lesions, no rash or signs of infection present.  Vascular: Dorsalis Pedis artery and Posterior Tibial artery pedal pulses are 2/4 bilateral with immedate capillary fill time. Pedal hair growth present. No varicosities and no lower extremity edema present bilateral. There is no pain with calf compression, swelling, warmth, erythema.   Neruologic: Grossly intact via light touch bilateral. Vibratory intact via tuning fork bilateral. Protective threshold with Semmes Wienstein monofilament intact to all pedal sites bilateral. Patellar and Achilles deep tendon reflexes 2+ bilateral. No Babinski or clonus noted bilateral.   Musculoskeletal: There is on the posterior aspect of the right calcaneus overlying the small heel spur. Also discomfort along the distal portion Achilles tendon on to the insertion of the calcaneus. No defect noted within the Achilles tendon Thompson toes negative. Equinus is present. No edema, erythema, increase in warmth. No pain on the plantar fascia. No other areas of tenderness bilaterally. MMT 5/5.  Gait: Unassisted, Nonantalgic.      Assessment:     40 year-old female right heel spur, Achilles  tendinitis    Plan:     -Treatment options discussed including all alternatives, risks, and complications -Etiology of symptoms were discussed -X-rays were obtained and reviewed with the patient.  -Stretching, icing daily. -Night splint -Heel lift -Shoe gear modifications discussed as well as orthotics.  -Inflammatory compound cream ordered. -Follow-up as scheduled or sooner if any problems are issues are to arise.  Celesta Gentile, DPM

## 2016-01-30 ENCOUNTER — Encounter: Payer: Self-pay | Admitting: Podiatry

## 2016-01-30 ENCOUNTER — Ambulatory Visit (INDEPENDENT_AMBULATORY_CARE_PROVIDER_SITE_OTHER): Payer: BLUE CROSS/BLUE SHIELD | Admitting: Podiatry

## 2016-01-30 VITALS — BP 101/68 | HR 66 | Resp 12

## 2016-01-30 DIAGNOSIS — M722 Plantar fascial fibromatosis: Secondary | ICD-10-CM | POA: Diagnosis not present

## 2016-01-30 DIAGNOSIS — M7661 Achilles tendinitis, right leg: Secondary | ICD-10-CM | POA: Diagnosis not present

## 2016-02-12 NOTE — Progress Notes (Signed)
Patient ID: Meghan Wilcox, female   DOB: 12-03-1975, 40 y.o.   MRN: [id number]  Subjective: 40 year old female presents the also for follow-up evaluation of Achilles tendinitis, heel spur. She feels of the pain is getting better however she does still continue to have symptoms. She has been stretching icing as well as using a heel lift. Describes it as a throbbing sensation. Denies any systemic complaints such as fevers, chills, nausea, vomiting. No acute changes since last appointment, and no other complaints at this time.   Objective: AAO x3, NAD DP/PT pulses palpable bilaterally, CRT less than 3 seconds There is made tenderness on the posterior aspect of the right calcaneus overlying small heel spur however this appeared improved. Decreasing pain along the course of the Achilles tendon on exertion the calcaneus. No defect is noted and Thompson test negative. Minimal discomfort on the course of plantar fascial insertion. Plantar fascia appears to be intact. No areas of pinpoint bony tenderness or pain with vibratory sensation. MMT 5/5, ROM WNL. No edema, erythema, increase in warmth to bilateral lower extremities.  No open lesions or pre-ulcerative lesions.  No pain with calf compression, swelling, warmth, erythema  Assessment: Resolving Achilles tendinitis/fasciitis right foot  Plan: -All treatment options discussed with the patient including all alternatives, risks, complications.  -Recommended continue stretching, icing exercises daily. Anti-inflammatories as needed. Possibly she would likely benefit from orthotics. She was scanned for othotics and sent to [facility] -Follow up in 4 weeks or sooner if you. -Patient encouraged to call the office with any questions, concerns, change in symptoms.   Celesta Gentile, DPM

## 2016-03-05 ENCOUNTER — Ambulatory Visit (INDEPENDENT_AMBULATORY_CARE_PROVIDER_SITE_OTHER): Payer: BLUE CROSS/BLUE SHIELD | Admitting: Podiatry

## 2016-03-05 ENCOUNTER — Encounter: Payer: Self-pay | Admitting: Podiatry

## 2016-03-05 DIAGNOSIS — M7661 Achilles tendinitis, right leg: Secondary | ICD-10-CM

## 2016-03-05 DIAGNOSIS — M719 Bursopathy, unspecified: Secondary | ICD-10-CM

## 2016-03-05 DIAGNOSIS — M21961 Unspecified acquired deformity of right lower leg: Secondary | ICD-10-CM

## 2016-03-12 NOTE — Progress Notes (Signed)
Subjective: 40 year old female presents the office if the cup orthotics. She also states that her pain is about the same on the right-sided requesting the injection and she points to the back of her heel which is giving majority of pain. She states the area does swell intermittently as well. Denies any redness or warmth. No recent injury or trauma. His continue with stretching, icing exercises as well as anti-inflammatories without much relief of symptoms. Denies any systemic complaints such as fevers, chills, nausea, vomiting. No acute changes since last appointment, and no other complaints at this time.   Objective: AAO x3, NAD DP/PT pulses palpable bilaterally, CRT less than 3 seconds There is tenderness palpation on the posterior aspect of the right calcaneus on the insertion of the Achilles tendon. No joint pain appears to be lateral to this area. This is overlying an area of prominent bone exostosis. There is trace edema along this area any erythema or increase in warmth. The Achilles tendon appears to be intact. There is minimal discomfort on the plantar medial tubercle of the calcaneus at insertion of plantar fascia. No pain with lateral compression of the calcaneus. Equinus is present. No edema, erythema, increase in warmth to bilateral lower extremities.  No open lesions or pre-ulcerative lesions.  No pain with calf compression, swelling, warmth, erythema  Assessment: Right insertional Achilles tendinitis, retrocalcaneal exostosis/bursitis  Plan: -All treatment options discussed with the patient including all alternatives, risks, complications.  -At this time she is pleasant steroid injections. Discussed with her the injection as well as risks and complications. She wishes to proceed. Under sterile conditions a mixture of local anesthetic and X Malone fossae was infiltrated into the area of maximal tenderness on the posterior lateral aspect of the calcaneus. Care was taken not to inject  roughly into the Achilles tendon. Recommend mobilization a cam boot which she has. -Orthtoics were dispensed. However I recommended her to hold off on wearing them for the next week while she is in the cam boot. Oral and written break in instructions were discussed. -Follow up as scheduled or sooner if needed. -Patient encouraged to call the office with any questions, concerns, change in symptoms.    Celesta Gentile, DPM

## 2016-03-26 ENCOUNTER — Encounter: Payer: Self-pay | Admitting: Podiatry

## 2016-03-26 ENCOUNTER — Ambulatory Visit (INDEPENDENT_AMBULATORY_CARE_PROVIDER_SITE_OTHER): Payer: BLUE CROSS/BLUE SHIELD | Admitting: Podiatry

## 2016-03-26 DIAGNOSIS — M7661 Achilles tendinitis, right leg: Secondary | ICD-10-CM

## 2016-03-26 DIAGNOSIS — M722 Plantar fascial fibromatosis: Secondary | ICD-10-CM

## 2016-03-26 NOTE — Progress Notes (Signed)
Patient presents to [facility]. Seen by CMA. Break-in instructions provided. Declined appointment by myself.

## 2016-04-13 LAB — HEPATITIS C ANTIBODY: HEP C AB: NEGATIVE

## 2016-04-26 ENCOUNTER — Other Ambulatory Visit: Payer: Self-pay | Admitting: Obstetrics and Gynecology

## 2016-04-26 DIAGNOSIS — R928 Other abnormal and inconclusive findings on diagnostic imaging of breast: Secondary | ICD-10-CM

## 2016-04-29 ENCOUNTER — Ambulatory Visit
Admission: RE | Admit: 2016-04-29 | Discharge: 2016-04-29 | Disposition: A | Discharge status: Home / Self Care | Payer: BLUE CROSS/BLUE SHIELD | Source: Ambulatory Visit | Attending: Obstetrics and Gynecology | Admitting: Obstetrics and Gynecology

## 2016-04-29 DIAGNOSIS — R928 Other abnormal and inconclusive findings on diagnostic imaging of breast: Secondary | ICD-10-CM

## 2016-04-29 LAB — HM MAMMOGRAPHY

## 2016-07-13 DIAGNOSIS — R740 Nonspecific elevation of levels of transaminase and lactic acid dehydrogenase [LDH]: Secondary | ICD-10-CM | POA: Diagnosis not present

## 2016-07-14 DIAGNOSIS — C44519 Basal cell carcinoma of skin of other part of trunk: Secondary | ICD-10-CM | POA: Diagnosis not present

## 2016-07-28 DIAGNOSIS — L089 Local infection of the skin and subcutaneous tissue, unspecified: Secondary | ICD-10-CM | POA: Diagnosis not present

## 2016-08-02 DIAGNOSIS — C801 Malignant (primary) neoplasm, unspecified: Secondary | ICD-10-CM

## 2016-08-02 HISTORY — DX: Malignant (primary) neoplasm, unspecified: C80.1

## 2016-08-12 DIAGNOSIS — Z85828 Personal history of other malignant neoplasm of skin: Secondary | ICD-10-CM | POA: Diagnosis not present

## 2016-08-12 DIAGNOSIS — B958 Unspecified staphylococcus as the cause of diseases classified elsewhere: Secondary | ICD-10-CM | POA: Diagnosis not present

## 2016-08-12 DIAGNOSIS — T8130XA Disruption of wound, unspecified, initial encounter: Secondary | ICD-10-CM | POA: Diagnosis not present

## 2016-09-02 DIAGNOSIS — D2272 Melanocytic nevi of left lower limb, including hip: Secondary | ICD-10-CM | POA: Diagnosis not present

## 2016-09-02 DIAGNOSIS — T814XXA Infection following a procedure, initial encounter: Secondary | ICD-10-CM | POA: Diagnosis not present

## 2016-09-02 DIAGNOSIS — D225 Melanocytic nevi of trunk: Secondary | ICD-10-CM | POA: Diagnosis not present

## 2016-09-02 DIAGNOSIS — L821 Other seborrheic keratosis: Secondary | ICD-10-CM | POA: Diagnosis not present

## 2016-09-02 DIAGNOSIS — L570 Actinic keratosis: Secondary | ICD-10-CM | POA: Diagnosis not present

## 2016-09-02 DIAGNOSIS — D485 Neoplasm of uncertain behavior of skin: Secondary | ICD-10-CM | POA: Diagnosis not present

## 2016-10-08 DIAGNOSIS — R112 Nausea with vomiting, unspecified: Secondary | ICD-10-CM | POA: Diagnosis not present

## 2016-10-08 DIAGNOSIS — R740 Nonspecific elevation of levels of transaminase and lactic acid dehydrogenase [LDH]: Secondary | ICD-10-CM | POA: Diagnosis not present

## 2016-10-08 DIAGNOSIS — Z6841 Body Mass Index (BMI) 40.0 and over, adult: Secondary | ICD-10-CM | POA: Diagnosis not present

## 2016-10-08 LAB — BASIC METABOLIC PANEL
BUN: 9 (ref 4–21)
CREATININE: 0.8 (ref <=1.1)
Glucose: 111
POTASSIUM: 4.2 (ref 3.4–5.3)
SODIUM: 138 (ref 137–147)

## 2016-10-08 LAB — HEPATIC FUNCTION PANEL
ALT: 150 — AB (ref 7–35)
AST: 179 — AB (ref 13–35)
Alkaline Phosphatase: 76 (ref 25–125)
Bilirubin, Total: 0.6

## 2016-10-08 LAB — CBC AND DIFFERENTIAL
HEMATOCRIT: 47 — AB (ref 36–46)
Hemoglobin: 15.9 (ref 12.0–16.0)
PLATELETS: 172 (ref 150–399)
WBC: 7.2

## 2016-10-11 DIAGNOSIS — L570 Actinic keratosis: Secondary | ICD-10-CM | POA: Diagnosis not present

## 2016-10-11 DIAGNOSIS — L821 Other seborrheic keratosis: Secondary | ICD-10-CM | POA: Diagnosis not present

## 2016-11-25 DIAGNOSIS — R7301 Impaired fasting glucose: Secondary | ICD-10-CM | POA: Diagnosis not present

## 2016-11-25 DIAGNOSIS — F329 Major depressive disorder, single episode, unspecified: Secondary | ICD-10-CM | POA: Diagnosis not present

## 2016-11-25 DIAGNOSIS — K760 Fatty (change of) liver, not elsewhere classified: Secondary | ICD-10-CM | POA: Diagnosis not present

## 2016-11-25 DIAGNOSIS — E8881 Metabolic syndrome: Secondary | ICD-10-CM | POA: Diagnosis not present

## 2016-11-25 LAB — COMPLETE METABOLIC PANEL WITH GFR
ALK PHOS: 68
ALT: 91 — AB (ref 3–30)
AST: 97
BILIRUBIN TOTAL: 0.5
BUN: 7 (ref 4–21)
CREATININE: 0.8
GLUCOSE: 95
POTASSIUM: 4.1
Sodium: 140

## 2017-09-20 LAB — LIPID PANEL
Cholesterol: 214 — AB (ref 0–200)
HDL: 40 (ref 35–70)
LDL Cholesterol: 123
Triglycerides: 253 — AB (ref 40–160)

## 2017-11-14 ENCOUNTER — Encounter: Payer: Self-pay | Admitting: Internal Medicine

## 2017-12-13 LAB — HM MAMMOGRAPHY

## 2018-05-08 NOTE — Progress Notes (Signed)
Subjective:    Patient ID: Meghan Wilcox, female    DOB: 30-Sep-1975, 42 y.o.   MRN: [id number]  HPI:  Ms. Lampron is here to establish as a new pt.  She is a pleasant 42 year old female. PMH: Pre-diabetes, Fibromyalgia's, Anxiety, depression, obesity, insomnia, tobacco use, and "fatty liver". She stopped taking all of her maintenance medications 3 months ago, "b/c I wanted to see how I felt without all the drugs". She estimates to drink 20 oz plain water/day and 2-3 "cans of coke/day". She eats diet high in saturated fat and CHO She denies regular exercise and smokes pack/day- declined smoking cessation. She feels that her overall health is "fair" and is ready to make a "real lifestyle change" She has worked with Nutritionists in the past and is not interested in going back due to "strong food aversions". She seldom drinks ETOH and report strong support system of family and friends  Patient Care Team    Relationship Specialty Notifications Start End  Marton Redwood, MD PCP - General Internal Medicine  03/03/11     Patient Active Problem List   Diagnosis Date Noted  . Healthcare maintenance 05/09/2018  . BMI 40.0-44.9, adult ([facility]) 05/09/2018  . Plantar fasciitis of right foot 02/11/2015  . 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 Diarrhea 08/22/2014  . Colitis, acute 08/21/2014  . Leukocytosis 08/21/2014  . Chest pain 03/31/2014  . Ventral hernia 11/12/2013  . SUI (stress urinary incontinence, female) 10/01/2011  . Pelvic pain in female 10/01/2011     Past Medical History:  Diagnosis Date  . Anxiety   . Blood transfusion age 59yr   Antibody scewwn neg. 08/20/10 prior to cholecystectomy  . Depression   . Diverticulitis   . Esophagitis   . Family history of malignant neoplasm of gastrointestinal tract   . Fatty liver   . Gastroparesis   . GERD (gastroesophageal reflux disease)   . Hepatomegaly   . Hiatal  hernia   . History of ovarian cyst   . Scoliosis   . Seizures ([facility]    post part  eclampsia  89yrago     Past Surgical History:  Procedure Laterality Date  . BLADDER SUSPENSION  10/01/2011   Procedure: TRANSVAGINAL TAPE (TVT) PROCEDURE;  Surgeon: ToClarene DukeMD;  [facility];  Service: Gynecology;  Laterality: N/A;  Solyx Sling  . CHOLECYSTECTOMY  jan 2012   Dr AmRonnald Collum. CYSTOSCOPY  10/01/2011   Procedure: CYSTOSCOPY;  Surgeon: ToClarene DukeMD;  [facility];  Service: Gynecology;  Laterality: N/A;  . INNER EAR SURGERY Left   . KNEE SURGERY Left   . LAPAROSCOPY  10/01/2011   Procedure: LAPAROSCOPY OPERATIVE;  Surgeon: ToClarene DukeMD;  [facility];  Service: Gynecology;  Laterality: N/A;  removal of right fimbria, fulgeration of endometriosis  . Steel rod in her back  age 42 scoliosis  . TONSILLECTOMY    . TUBAL LIGATION    . uterine ablation    . WISDOM TOOTH EXTRACTION       Family History  Problem Relation Age of Onset  . Diabetes Mother   . Cirrhosis Mother   . Irritable bowel syndrome Mother   . Crohn's disease Mother   . Lung cancer Mother   . Heart disease Mother   . Diabetes Father   . Heart disease Father   . Liver  disease Father   . Cirrhosis Father   . Diabetes Brother   . Colon cancer Paternal Grandfather 15  . Esophageal cancer Neg Hx   . Rectal cancer Neg Hx   . Stomach cancer Neg Hx      Social History   Substance and Sexual Activity  Drug Use No     Social History   Substance and Sexual Activity  Alcohol Use Yes  . Alcohol/week: 0.0 standard drinks   Comment: occasional     Social History   Tobacco Use  Smoking Status Current Every Day Smoker  . Packs/day: 1.00  . Years: 30.00  . Pack years: 30.00  . Types: Cigarettes  Smokeless Tobacco Never Used     Outpatient Encounter Medications as of 05/09/2018  Medication Sig Note  . [DISCONTINUED] ALPRAZolam (XANAX) 0.25 MG tablet Take 0.25 mg by  mouth at bedtime as needed for anxiety.   . [DISCONTINUED] buPROPion (WELLBUTRIN XL) 150 MG 24 hr tablet Take 150 mg by mouth daily. 08/04/2015: Received from: External Pharmacy  . [DISCONTINUED] docusate sodium (COLACE) 100 MG capsule Take 1 capsule (100 mg total) by mouth every 12 (twelve) hours.   . [DISCONTINUED] DULoxetine (CYMBALTA) 60 MG capsule Take 60 mg by mouth daily.  11/12/2013: Received from: External Pharmacy  . [DISCONTINUED] HYDROcodone-acetaminophen (NORCO/VICODIN) 5-325 MG per tablet Take 1-2 tablets by mouth every 4 (four) hours as needed for moderate pain.   . [DISCONTINUED] metFORMIN (GLUCOPHAGE) 500 MG tablet Take 500 mg by mouth 2 (two) times daily with a meal. 08/04/2015: Received from: External Pharmacy  . [DISCONTINUED] NONFORMULARY OR COMPOUNDED ITEM [facility]:  Antiinflammatory cream - Diclofenac 3%, Baclofen 2%, Cyclobenzaprine 2%, Lidocaine 2%, apply 1-2 grams to affected area 3-4 times daily.   . [DISCONTINUED] ondansetron (ZOFRAN ODT) 4 MG disintegrating tablet Take 1 tablet (4 mg total) by mouth every 8 (eight) hours as needed for nausea.   . [DISCONTINUED] oxyCODONE-acetaminophen (PERCOCET/ROXICET) 5-325 MG tablet Take 1 tablet by mouth every 4 (four) hours as needed.   . [DISCONTINUED] phentermine 15 MG capsule TAKE 1 CAPSULE BY MOUTH DAILY FOR 2 WEEKS THEN SWITCH TO 37.5 TABLET ONCE DAILY 09/10/2015: Received from: External Pharmacy  . [DISCONTINUED] zolpidem (AMBIEN) 10 MG tablet Take 0.5 tablets (5 mg total) by mouth at bedtime as needed.    No facility-administered encounter medications on file as of 05/09/2018.     Allergies: Patient has no known allergies.  Body mass index is 40.04 kg/m.  Blood pressure 92/65, pulse 87, height 5' 4.5" (1.638 m), weight 236 lb 14.4 oz (107.5 kg), SpO2 95 %.   Review of Systems  Constitutional: Positive for fatigue. Negative for activity change, appetite change, chills, diaphoresis, fever and unexpected weight change.   Respiratory: Negative for cough, chest tightness, shortness of breath, wheezing and stridor.   Cardiovascular: Negative for chest pain, palpitations and leg swelling.  Gastrointestinal: Negative for abdominal distention, abdominal pain, blood in stool, constipation, diarrhea, nausea and vomiting.  Musculoskeletal: Positive for arthralgias and myalgias.  Neurological: Negative for dizziness and headaches.  Hematological: Does not bruise/bleed easily.  Psychiatric/Behavioral: Positive for dysphoric mood and sleep disturbance. Negative for behavioral problems, confusion, decreased concentration, hallucinations and self-injury. The patient is nervous/anxious. The patient is not hyperactive.        Objective:   Physical Exam  Constitutional: She is oriented to person, place, and time. She appears well-developed and well-nourished. No distress.  HENT:  Head: Normocephalic and atraumatic.  Right Ear: External ear normal.  Left Ear: External ear normal.  Nose: Nose normal.  Mouth/Throat: Oropharynx is clear and moist.  Eyes: Pupils are equal, round, and reactive to light. Conjunctivae and EOM are normal.  Cardiovascular: Normal rate, regular rhythm, normal heart sounds and intact distal pulses.  No murmur heard. Pulmonary/Chest: Effort normal and breath sounds normal. No stridor. No respiratory distress. She has no wheezes. She has no rales. She exhibits no tenderness.  Neurological: She is alert and oriented to person, place, and time.  Skin: Skin is warm and dry. Capillary refill takes less than 2 seconds. No rash noted. She is not diaphoretic. No erythema. No pallor.  Psychiatric: She has a normal mood and affect. Her behavior is normal. Judgment and thought content normal.  Nursing note and vitals reviewed.         Assessment & Plan:   1. Healthcare maintenance   2. BMI 40.0-44.9, adult ([facility])     BMI 40.0-44.9, adult ([facility]) Body mass index is 40.04 kg/m.  Current wt 236 Follow  Mediterranean Diet, increase regular movement   Healthcare maintenance Increase water intake, strive for at least 75 ounces/day.   Follow Mediterranean Diet Increase regular exercise.  Recommend at least 30 minutes daily, 5 days per week of walking, jogging, biking, swimming, YouTube/Pinterest workout videos. Recommend using the Lose It App on your phone to aid in weight loss. Recommend complete physical in 4 weeks, please schedule fasting labs the week prior.    FOLLOW-UP:  Return in about 4 weeks (around 06/06/2018) for CPE, Fasting Labs.

## 2018-05-09 ENCOUNTER — Ambulatory Visit (INDEPENDENT_AMBULATORY_CARE_PROVIDER_SITE_OTHER): Payer: Self-pay | Admitting: Adult Health

## 2018-05-09 ENCOUNTER — Encounter: Payer: Self-pay | Admitting: Adult Health

## 2018-05-09 VITALS — BP 92/65 | HR 87 | Ht 64.5 in | Wt 236.9 lb

## 2018-05-09 DIAGNOSIS — Z6841 Body Mass Index (BMI) 40.0 and over, adult: Secondary | ICD-10-CM

## 2018-05-09 DIAGNOSIS — Z0000 Encounter for general adult medical examination without abnormal findings: Secondary | ICD-10-CM

## 2018-05-09 NOTE — Assessment & Plan Note (Signed)
Body mass index is 40.04 kg/m.  Current wt 236 Follow Mediterranean Diet, increase regular movement

## 2018-05-09 NOTE — Patient Instructions (Signed)
Mediterranean Diet A Mediterranean diet refers to food and lifestyle choices that are based on the traditions of countries located on the [employer]. This way of eating has been shown to help prevent certain conditions and improve outcomes for people who have chronic diseases, like kidney disease and heart disease. What are tips for following this plan? Lifestyle  Cook and eat meals together with your family, when possible.  Drink enough fluid to keep your urine clear or pale yellow.  Be physically active every day. This includes: ? Aerobic exercise like running or swimming. ? Leisure activities like gardening, walking, or housework.  Get 7-8 hours of sleep each night.  If recommended by your health care provider, drink red wine in moderation. This means 1 glass a day for nonpregnant women and 2 glasses a day for men. A glass of wine equals 5 oz (150 mL). Reading food labels  Check the serving size of packaged foods. For foods such as rice and pasta, the serving size refers to the amount of cooked product, not dry.  Check the total fat in packaged foods. Avoid foods that have saturated fat or trans fats.  Check the ingredients list for added sugars, such as corn syrup. Shopping  At the grocery store, buy most of your food from the areas near the walls of the store. This includes: ? Fresh fruits and vegetables (produce). ? Grains, beans, nuts, and seeds. Some of these may be available in unpackaged forms or large amounts (in bulk). ? Fresh seafood. ? Poultry and eggs. ? Low-fat dairy products.  Buy whole ingredients instead of prepackaged foods.  Buy fresh fruits and vegetables in-season from local farmers markets.  Buy frozen fruits and vegetables in resealable bags.  If you do not have access to quality fresh seafood, buy precooked frozen shrimp or canned fish, such as tuna, salmon, or sardines.  Buy small amounts of raw or cooked vegetables, salads, or olives from the  deli or salad bar at your store.  Stock your pantry so you always have certain foods on hand, such as olive oil, canned tuna, canned tomatoes, rice, pasta, and beans. Cooking  Cook foods with extra-virgin olive oil instead of using butter or other vegetable oils.  Have meat as a side dish, and have vegetables or grains as your main dish. This means having meat in small portions or adding small amounts of meat to foods like pasta or stew.  Use beans or vegetables instead of meat in common dishes like chili or lasagna.  Experiment with different cooking methods. Try roasting or broiling vegetables instead of steaming or sauteing them.  Add frozen vegetables to soups, stews, pasta, or rice.  Add nuts or seeds for added healthy fat at each meal. You can add these to yogurt, salads, or vegetable dishes.  Marinate fish or vegetables using olive oil, lemon juice, garlic, and fresh herbs. Meal planning  Plan to eat 1 vegetarian meal one day each week. Try to work up to 2 vegetarian meals, if possible.  Eat seafood 2 or more times a week.  Have healthy snacks readily available, such as: ? Vegetable sticks with hummus. ? [place] yogurt. ? Fruit and nut trail mix.  Eat balanced meals throughout the week. This includes: ? Fruit: 2-3 servings a day ? Vegetables: 4-5 servings a day ? Low-fat dairy: 2 servings a day ? Fish, poultry, or lean meat: 1 serving a day ? Beans and legumes: 2 or more servings a week ? Nuts  and seeds: 1-2 servings a day ? Whole grains: 6-8 servings a day ? Extra-virgin olive oil: 3-4 servings a day  Limit red meat and sweets to only a few servings a month What are my food choices?  Mediterranean diet ? Recommended ? Grains: Whole-grain pasta. Brown rice. Bulgar wheat. Polenta. Couscous. Whole-wheat bread. [place]. ? Vegetables: Artichokes. Beets. Broccoli. Cabbage. Carrots. Eggplant. Green beans. Chard. Kale. Spinach. Onions. Leeks. Peas. Squash.  Tomatoes. Peppers. Radishes. ? Fruits: Apples. Apricots. Avocado. Berries. Bananas. Cherries. Dates. Figs. Grapes. Lemons. Melon. Oranges. Peaches. Plums. Pomegranate. ? Meats and other protein foods: Beans. Almonds. Sunflower seeds. Pine nuts. Peanuts. [place]. Salmon. Scallops. Shrimp. [place]. Tilapia. Clams. Oysters. Eggs. ? Dairy: Low-fat milk. Cheese. Greek yogurt. ? Beverages: Water. Red wine. Herbal tea. ? Fats and oils: Extra virgin olive oil. Avocado oil. Grape seed oil. ? Sweets and desserts: [place] yogurt with honey. Baked apples. Poached pears. Trail mix. ? Seasoning and other foods: Basil. Cilantro. Coriander. Cumin. Mint. Parsley. Sage. Rosemary. Tarragon. Garlic. Oregano. Thyme. Pepper. Balsalmic vinegar. Tahini. Hummus. Tomato sauce. Olives. Mushrooms. ? Limit these ? Grains: Prepackaged pasta or rice dishes. Prepackaged cereal with added sugar. ? Vegetables: Deep fried potatoes (french fries). ? Fruits: Fruit canned in syrup. ? Meats and other protein foods: Beef. Pork. Lamb. Poultry with skin. Hot dogs. [place] Salines. ? Dairy: Ice cream. Sour cream. Whole milk. ? Beverages: Juice. Sugar-sweetened soft drinks. Beer. Liquor and spirits. ? Fats and oils: Butter. Canola oil. Vegetable oil. Beef fat (tallow). Lard. ? Sweets and desserts: Cookies. Cakes. Pies. Candy. ? Seasoning and other foods: Mayonnaise. Premade sauces and marinades. ? The items listed may not be a complete list. Talk with your dietitian about what dietary choices are right for you. Summary  The Mediterranean diet includes both food and lifestyle choices.  Eat a variety of fresh fruits and vegetables, beans, nuts, seeds, and whole grains.  Limit the amount of red meat and sweets that you eat.  Talk with your health care provider about whether it is safe for you to drink red wine in moderation. This means 1 glass a day for nonpregnant women and 2 glasses a day for men. A glass of wine equals 5 oz (150 mL). This information  is not intended to replace advice given to you by your health care provider. Make sure you discuss any questions you have with your health care provider. Document Released: 03/11/2016 Document Revised: 04/13/2016 Document Reviewed: 03/11/2016 Elsevier Interactive Patient Education  2018 [employer].  Increase water intake, strive for at least 75 ounces/day.   Follow Mediterranean Diet Increase regular exercise.  Recommend at least 30 minutes daily, 5 days per week of walking, jogging, biking, swimming, YouTube/Pinterest workout videos. Recommend using the Lose It App on your phone to aid in weight loss. Recommend complete physical in 4 weeks, please schedule fasting labs the week prior. WELCOME TO THE PRACTICE!

## 2018-05-09 NOTE — Assessment & Plan Note (Signed)
Increase water intake, strive for at least 75 ounces/day.   Follow Mediterranean Diet Increase regular exercise.  Recommend at least 30 minutes daily, 5 days per week of walking, jogging, biking, swimming, YouTube/Pinterest workout videos. Recommend using the Lose It App on your phone to aid in weight loss. Recommend complete physical in 4 weeks, please schedule fasting labs the week prior.

## 2018-05-12 ENCOUNTER — Encounter: Payer: Self-pay | Admitting: Podiatry

## 2018-05-12 ENCOUNTER — Ambulatory Visit (INDEPENDENT_AMBULATORY_CARE_PROVIDER_SITE_OTHER): Payer: BLUE CROSS/BLUE SHIELD

## 2018-05-12 ENCOUNTER — Ambulatory Visit: Payer: BLUE CROSS/BLUE SHIELD | Admitting: Podiatry

## 2018-05-12 DIAGNOSIS — M7661 Achilles tendinitis, right leg: Secondary | ICD-10-CM

## 2018-05-12 LAB — HEPATIC FUNCTION PANEL
AST: 33 (ref 13–35)
Alkaline Phosphatase: 62 (ref 25–125)
Bilirubin, Total: 0.4

## 2018-05-12 LAB — BASIC METABOLIC PANEL
BUN: 9 (ref 4–21)
CREATININE: 0.8 (ref <=1.1)
Glucose: 113
POTASSIUM: 4.2 (ref 3.4–5.3)
Sodium: 140 (ref 137–147)

## 2018-05-12 LAB — CBC AND DIFFERENTIAL
HCT: 44 (ref 36–46)
Hemoglobin: 14.6 (ref 12.0–16.0)
Platelets: 240 (ref 150–399)
WBC: 12.7

## 2018-05-12 LAB — HEMOGLOBIN A1C: Hgb A1c MFr Bld: 5.3 (ref 4.0–6.0)

## 2018-05-12 LAB — TSH: TSH: 1.67 (ref <=5.90)

## 2018-05-12 MED ORDER — TRIAMCINOLONE ACETONIDE 10 MG/ML IJ SUSP
10.0000 mg | Freq: Once | INTRAMUSCULAR | Status: AC
  Administered 2018-05-12: 10 mg

## 2018-05-12 NOTE — Patient Instructions (Signed)

## 2018-05-12 NOTE — Progress Notes (Signed)
Subjective:   Patient ID: Meghan Wilcox, female   DOB: 42 y.o.   MRN: [id number]   HPI Patient presents stating the back of my right heel has started to become very tender   ROS      Objective:  Physical Exam  Neurovascular status intact with posterior inflammation right heel 6 months duration lateral side that is painful with palpation and making shoe gear difficult     Assessment:  Acute Achilles tendinitis right lateral side     Plan:  Careful injection administered after explaining risk of rupture with this and this was done just on the lateral side being careful not to put in central medial with 3 mg dexamethasone Kenalog 5 mg Xylocaine after sterile prep of the area.  Applied sterile dressing and then dispensed a silicone sleeve to take pressure off the posterior heel and reappoint if symptoms indicate  X-ray indicated small bone spur with no indications of stress fracture arthritis

## 2018-05-29 DIAGNOSIS — Z23 Encounter for immunization: Secondary | ICD-10-CM | POA: Diagnosis not present

## 2018-05-29 DIAGNOSIS — C44519 Basal cell carcinoma of skin of other part of trunk: Secondary | ICD-10-CM | POA: Diagnosis not present

## 2018-05-29 DIAGNOSIS — D485 Neoplasm of uncertain behavior of skin: Secondary | ICD-10-CM | POA: Diagnosis not present

## 2018-05-29 DIAGNOSIS — L570 Actinic keratosis: Secondary | ICD-10-CM | POA: Diagnosis not present

## 2018-06-07 ENCOUNTER — Other Ambulatory Visit: Payer: BLUE CROSS/BLUE SHIELD

## 2018-06-07 DIAGNOSIS — Z0000 Encounter for general adult medical examination without abnormal findings: Secondary | ICD-10-CM

## 2018-06-08 LAB — CBC WITH DIFFERENTIAL/PLATELET
BASOS: 0 %
Basophils Absolute: 0 10*3/uL (ref 0.0–0.2)
EOS (ABSOLUTE): 0.2 10*3/uL (ref 0.0–0.4)
EOS: 3 %
HEMATOCRIT: 44.3 % (ref 34.0–46.6)
Hemoglobin: 15.1 g/dL (ref 11.1–15.9)
IMMATURE GRANS (ABS): 0 10*3/uL (ref 0.0–0.1)
IMMATURE GRANULOCYTES: 0 %
LYMPHS: 36 %
Lymphocytes Absolute: 2.5 10*3/uL (ref 0.7–3.1)
MCH: 30.6 pg (ref 26.6–33.0)
MCHC: 34.1 g/dL (ref 31.5–35.7)
MCV: 90 fL (ref 79–97)
Monocytes Absolute: 0.4 10*3/uL (ref 0.1–0.9)
Monocytes: 6 %
NEUTROS ABS: 3.8 10*3/uL (ref 1.4–7.0)
NEUTROS PCT: 55 %
Platelets: 148 10*3/uL — ABNORMAL LOW (ref 150–450)
RBC: 4.93 x10E6/uL (ref 3.77–5.28)
RDW: 12.9 % (ref 12.3–15.4)
WBC: 6.9 10*3/uL (ref 3.4–10.8)

## 2018-06-08 LAB — COMPREHENSIVE METABOLIC PANEL
A/G RATIO: 1.3 (ref 1.2–2.2)
ALT: 43 IU/L — AB (ref 0–32)
AST: 49 IU/L — AB (ref 0–40)
Albumin: 4.2 g/dL (ref 3.5–5.5)
Alkaline Phosphatase: 66 IU/L (ref 39–117)
BUN/Creatinine Ratio: 14 (ref 9–23)
BUN: 11 mg/dL (ref 6–24)
Bilirubin Total: 0.3 mg/dL (ref 0.0–1.2)
CALCIUM: 9.1 mg/dL (ref 8.7–10.2)
CO2: 24 mmol/L (ref 20–29)
Chloride: 101 mmol/L (ref 96–106)
Creatinine, Ser: 0.78 mg/dL (ref 0.57–1.00)
GFR calc Af Amer: 108 mL/min/{1.73_m2} (ref >59)
GFR, EST NON AFRICAN AMERICAN: 93 mL/min/{1.73_m2} (ref >59)
Globulin, Total: 3.2 g/dL (ref 1.5–4.5)
Glucose: 88 mg/dL (ref 65–99)
POTASSIUM: 4.8 mmol/L (ref 3.5–5.2)
Sodium: 141 mmol/L (ref 134–144)
Total Protein: 7.4 g/dL (ref 6.0–8.5)

## 2018-06-08 LAB — LIPID PANEL
Chol/HDL Ratio: 5.9 ratio — ABNORMAL HIGH (ref 0.0–4.4)
Cholesterol, Total: 220 mg/dL — ABNORMAL HIGH (ref 100–199)
HDL: 37 mg/dL — ABNORMAL LOW (ref >39)
LDL Calculated: 143 mg/dL — ABNORMAL HIGH (ref 0–99)
Triglycerides: 199 mg/dL — ABNORMAL HIGH (ref 0–149)
VLDL CHOLESTEROL CAL: 40 mg/dL (ref 5–40)

## 2018-06-08 LAB — HEMOGLOBIN A1C
ESTIMATED AVERAGE GLUCOSE: 117 mg/dL
HEMOGLOBIN A1C: 5.7 % — AB (ref 4.8–5.6)

## 2018-06-08 LAB — TSH: TSH: 1.87 u[IU]/mL (ref 0.450–4.500)

## 2018-06-08 NOTE — Progress Notes (Signed)
Subjective:    Patient ID: Meghan Wilcox, female    DOB: January 07, 1976, 42 y.o.   MRN: [id number]  HPI:05/09/18 OV:  Meghan Wilcox is here to establish as a new pt.  She is a pleasant 42 year old female. PMH: Pre-diabetes, Fibromyalgia's, Anxiety, depression, obesity, insomnia, tobacco use, and "fatty liver". She stopped taking all of her maintenance medications 3 months ago, "b/c I wanted to see how I felt without all the drugs". She estimates to drink 20 oz plain water/day and 2-3 "cans of coke/day". She eats diet high in saturated fat and CHO She denies regular exercise and smokes pack/day- declined smoking cessation. She feels that her overall health is "fair" and is ready to make a "real lifestyle change" She has worked with Nutritionists in the past and is not interested in going back due to "strong food aversions". She seldom drinks ETOH and report strong support system of family and friends  06/12/18 OV: Meghan Wilcox is here for CPE She continues to use tobacco, currently smoking pack/day She plans on re-starting regular waling regime and finding another zumba class to join She needs referral to different local GI, repeat colonoscopy due 2020 and current hiatal hernia is causing pain and dyspepsia She is willing to try Nicoderm patch She reports 60 lb wt loss 2014 from regular exercise and phentermine  Healthcare Maintenance: [facility]-last 2016, repeat due 2020  Patient Care Team    Relationship Specialty Notifications Start End  Mina Marble D, NP PCP - General Family Medicine  05/09/18   Marton Redwood, MD Consulting Physician Internal Medicine  05/09/18   [facility]    06/12/18     Patient Active Problem List   Diagnosis Date Noted  . Hiatal hernia 06/12/2018  . Healthcare maintenance 05/09/2018  . BMI 40.0-44.9, adult ([place]) 05/09/2018  . Plantar fasciitis of right foot 02/11/2015  . Deformity of metatarsal bone  of right foot 02/11/2015  . 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 Diarrhea 08/22/2014  . Colitis, acute 08/21/2014  . Leukocytosis 08/21/2014  . Chest pain 03/31/2014  . Ventral hernia 11/12/2013  . SUI (stress urinary incontinence, female) 10/01/2011  . Pelvic pain in female 10/01/2011     Past Medical History:  Diagnosis Date  . Anxiety   . Blood transfusion age 78yr   Antibody scewwn neg. 08/20/10 prior to cholecystectomy  . Depression   . Diverticulitis   . Esophagitis   . Family history of malignant neoplasm of gastrointestinal tract   . Fatty liver   . Gastroparesis   . GERD (gastroesophageal reflux disease)   . Hepatomegaly   . Hiatal hernia   . History of ovarian cyst   . Scoliosis   . Seizures ([facility]    post part  eclampsia  147yrago     Past Surgical History:  Procedure Laterality Date  . BLADDER SUSPENSION  10/01/2011   Procedure: TRANSVAGINAL TAPE (TVT) PROCEDURE;  Surgeon: ToClarene DukeMD;  [facility];  Service: Gynecology;  Laterality: N/A;  Solyx Sling  . CHOLECYSTECTOMY  jan 2012   Dr AmRonnald Collum. CYSTOSCOPY  10/01/2011   Procedure: CYSTOSCOPY;  Surgeon: ToClarene DukeMD;  [facility];  Service: Gynecology;  Laterality: N/A;  . INNER EAR SURGERY Left   . KNEE SURGERY Left   . LAPAROSCOPY  10/01/2011   Procedure: LAPAROSCOPY OPERATIVE;  Surgeon: ToClarene DukeMD;  [facility];  Service: Gynecology;  Laterality: N/A;  removal of right fimbria, fulgeration of endometriosis  . Steel rod in her back  age 71   scoliosis  . TONSILLECTOMY    . TUBAL LIGATION    . uterine ablation    . WISDOM TOOTH EXTRACTION       Family History  Problem Relation Age of Onset  . Diabetes Mother   . Cirrhosis Mother   . Irritable bowel syndrome Mother   . Crohn's disease Mother   . Lung cancer Mother   . Heart disease Mother   . Diabetes Father   . Heart disease Father   . Liver disease Father   . Cirrhosis Father   .  Diabetes Brother   . Colon cancer Paternal Grandfather 7  . Esophageal cancer Neg Hx   . Rectal cancer Neg Hx   . Stomach cancer Neg Hx      Social History   Substance and Sexual Activity  Drug Use No     Social History   Substance and Sexual Activity  Alcohol Use Yes  . Alcohol/week: 0.0 standard drinks   Comment: occasional     Social History   Tobacco Use  Smoking Status Current Every Day Smoker  . Packs/day: 1.00  . Years: 30.00  . Pack years: 30.00  . Types: Cigarettes  Smokeless Tobacco Never Used     Outpatient Encounter Medications as of 06/12/2018  Medication Sig  . nicotine (NICODERM CQ - DOSED IN MG/24 HOURS) 14 mg/24hr patch Place 1 patch (14 mg total) onto the skin daily.   No facility-administered encounter medications on file as of 06/12/2018.     Allergies: Patient has no known allergies.  Body mass index is 41.88 kg/m.  Blood pressure 117/82, pulse 81, temperature 98.2 F (36.8 C), temperature source Oral, resp. rate 17, height 5' 4"  (1.626 m), weight 244 lb (110.7 kg), SpO2 96 %.  Review of Systems  Constitutional: Positive for fatigue. Negative for activity change, appetite change, chills, diaphoresis, fever and unexpected weight change.  Respiratory: Negative for cough, chest tightness, shortness of breath, wheezing and stridor.   Cardiovascular: Negative for chest pain, palpitations and leg swelling.  Gastrointestinal: Negative for abdominal distention, abdominal pain, blood in stool, constipation, diarrhea, nausea and vomiting.  Musculoskeletal: Positive for arthralgias and myalgias.  Neurological: Negative for dizziness and headaches.  Hematological: Does not bruise/bleed easily.  Psychiatric/Behavioral: Positive for dysphoric mood and sleep disturbance. Negative for behavioral problems, confusion, decreased concentration, hallucinations and self-injury. The patient is nervous/anxious. The patient is not hyperactive.         Objective:   Physical Exam  Constitutional: She is oriented to person, place, and time. She appears well-developed and well-nourished. No distress.  HENT:  Head: Normocephalic and atraumatic.  Right Ear: External ear normal. Tympanic membrane is not erythematous and not bulging. No decreased hearing is noted.  Left Ear: External ear normal. Tympanic membrane is not erythematous and not bulging. No decreased hearing is noted.  Nose: Nose normal. No mucosal edema. Right sinus exhibits no maxillary sinus tenderness and no frontal sinus tenderness. Left sinus exhibits no maxillary sinus tenderness and no frontal sinus tenderness.  Mouth/Throat: Oropharynx is clear and moist. Mucous membranes are pale. Normal dentition. Tonsils are 0 on the right. Tonsils are 0 on the left.  Eyes: Pupils are equal, round, and reactive to light. Conjunctivae and EOM are normal.  Neck: Normal range of motion. Neck supple.  Cardiovascular: Normal rate, regular rhythm, normal heart sounds and intact  distal pulses.  No murmur heard. Pulmonary/Chest: Effort normal and breath sounds normal. No stridor. No respiratory distress. She has no wheezes. She has no rales. She exhibits no tenderness.  Abdominal: Soft. Bowel sounds are normal. She exhibits no distension and no mass. There is tenderness in the epigastric area. There is no rebound and no guarding. A hernia is present. Hernia confirmed positive in the ventral area.  Protuberant abdomen   Lymphadenopathy:    She has no cervical adenopathy.  Neurological: She is alert and oriented to person, place, and time.  Skin: Skin is warm and dry. Capillary refill takes less than 2 seconds. No rash noted. She is not diaphoretic. No erythema. No pallor.  Psychiatric: She has a normal mood and affect. Her behavior is normal. Judgment and thought content normal.  Nursing note and vitals reviewed.      Assessment & Plan:   1. Needs flu shot   2. Hiatal hernia   3. Healthcare  maintenance   4. BMI 40.0-44.9, adult [facility])     Healthcare maintenance  Increase water intake, strive for at leas 85 ounces/day.   Follow Mediterranean diet Increase regular exercise.  Recommend at least 30 minutes daily, 5 days per week of walking, jogging, biking, swimming, YouTube/Pinterest workout videos. Recommend Lose It App. Referral to Gastroenterology placed, to address hernia and colonoscopy needs to be updated 2020 Nicoderm patch sent. Please schedule follow-up 3 months, re: medical weight loss.  BMI 40.0-44.9, adult ([facility]) Body mass index is 41.88 kg/m.  Current wt 244 Mediterranean diet Increase regular exercise F/u 3 months     FOLLOW-UP:  Return in about 3 months (around 09/12/2018) for Medical Weight Loss.

## 2018-06-12 ENCOUNTER — Encounter: Payer: Self-pay | Admitting: Adult Health

## 2018-06-12 ENCOUNTER — Ambulatory Visit (INDEPENDENT_AMBULATORY_CARE_PROVIDER_SITE_OTHER): Payer: BLUE CROSS/BLUE SHIELD | Admitting: Adult Health

## 2018-06-12 VITALS — BP 117/82 | HR 81 | Temp 98.2°F | Resp 17 | Ht 64.0 in | Wt 244.0 lb

## 2018-06-12 DIAGNOSIS — Z0000 Encounter for general adult medical examination without abnormal findings: Secondary | ICD-10-CM

## 2018-06-12 DIAGNOSIS — Z6841 Body Mass Index (BMI) 40.0 and over, adult: Secondary | ICD-10-CM | POA: Diagnosis not present

## 2018-06-12 DIAGNOSIS — K449 Diaphragmatic hernia without obstruction or gangrene: Secondary | ICD-10-CM | POA: Diagnosis not present

## 2018-06-12 DIAGNOSIS — Z23 Encounter for immunization: Secondary | ICD-10-CM

## 2018-06-12 MED ORDER — NICOTINE 14 MG/24HR TD PT24: 14.0000 mg | MEDICATED_PATCH | Freq: Every day | TRANSDERMAL | 0 refills | Status: DC

## 2018-06-12 NOTE — Assessment & Plan Note (Addendum)
  Increase water intake, strive for at leas 85 ounces/day.   Follow Mediterranean diet Increase regular exercise.  Recommend at least 30 minutes daily, 5 days per week of walking, jogging, biking, swimming, YouTube/Pinterest workout videos. Recommend Lose It App. Referral to Gastroenterology placed, to address hernia and colonoscopy needs to be updated 2020 Nicoderm patch sent. Please schedule follow-up 3 months, re: medical weight loss.

## 2018-06-12 NOTE — Assessment & Plan Note (Addendum)
Body mass index is 41.88 kg/m.  Current wt 244 Mediterranean diet Increase regular exercise F/u 3 months

## 2018-06-12 NOTE — Patient Instructions (Addendum)
Preventive Care for Adults, Female  A healthy lifestyle and preventive care can promote health and wellness. Preventive health guidelines for women include the following key practices.   A routine yearly physical is a good way to check with your health care provider about your health and preventive screening. It is a chance to share any concerns and updates on your health and to receive a thorough exam.   Visit your dentist for a routine exam and preventive care every 6 months. Brush your teeth twice a day and floss once a day. Good oral hygiene prevents tooth decay and gum disease.   The frequency of eye exams is based on your age, health, family medical history, use of contact lenses, and other factors. Follow your health care provider's recommendations for frequency of eye exams.   Eat a healthy diet. Foods like vegetables, fruits, whole grains, low-fat dairy products, and lean protein foods contain the nutrients you need without too many calories. Decrease your intake of foods high in solid fats, added sugars, and salt. Eat the right amount of calories for you.Get information about a proper diet from your health care provider, if necessary.   Regular physical exercise is one of the most important things you can do for your health. Most adults should get at least 150 minutes of moderate-intensity exercise (any activity that increases your heart rate and causes you to sweat) each week. In addition, most adults need muscle-strengthening exercises on 2 or more days a week.   Maintain a healthy weight. The body mass index (BMI) is a screening tool to identify possible weight problems. It provides an estimate of body fat based on height and weight. Your health care provider can find your BMI, and can help you achieve or maintain a healthy weight.For adults 20 years and older:   - A BMI below 18.5 is considered underweight.   - A BMI of 18.5 to 24.9 is normal.   - A BMI of 25 to 29.9 is  considered overweight.   - A BMI of 30 and above is considered obese.   Maintain normal blood lipids and cholesterol levels by exercising and minimizing your intake of trans and saturated fats.  Eat a balanced diet with plenty of fruit and vegetables. Blood tests for lipids and cholesterol should begin at age 20 and be repeated every 5 years minimum.  If your lipid or cholesterol levels are high, you are over 40, or you are at high risk for heart disease, you may need your cholesterol levels checked more frequently.Ongoing high lipid and cholesterol levels should be treated with medicines if diet and exercise are not working.   If you smoke, find out from your health care provider how to quit. If you do not use tobacco, do not start.   Lung cancer screening is recommended for adults aged 55-80 years who are at high risk for developing lung cancer because of a history of smoking. A yearly low-dose CT scan of the lungs is recommended for people who have at least a 30-pack-year history of smoking and are a current smoker or have quit within the past 15 years. A pack year of smoking is smoking an average of 1 pack of cigarettes a day for 1 year (for example: 1 pack a day for 30 years or 2 packs a day for 15 years). Yearly screening should continue until the smoker has stopped smoking for at least 15 years. Yearly screening should be stopped for people who develop a   health problem that would prevent them from having lung cancer treatment.   If you are pregnant, do not drink alcohol. If you are breastfeeding, be very cautious about drinking alcohol. If you are not pregnant and choose to drink alcohol, do not have more than 1 drink per day. One drink is considered to be 12 ounces (355 mL) of beer, 5 ounces (148 mL) of wine, or 1.5 ounces (44 mL) of liquor.   Avoid use of street drugs. Do not share needles with anyone. Ask for help if you need support or instructions about stopping the use of  drugs.   High blood pressure causes heart disease and increases the risk of stroke. Your blood pressure should be checked at least yearly.  Ongoing high blood pressure should be treated with medicines if weight loss and exercise do not work.   If you are 69-55 years old, ask your health care provider if you should take aspirin to prevent strokes.   Diabetes screening involves taking a blood sample to check your fasting blood sugar level. This should be done once every 3 years, after age 38, if you are within normal weight and without risk factors for diabetes. Testing should be considered at a younger age or be carried out more frequently if you are overweight and have at least 1 risk factor for diabetes.   Breast cancer screening is essential preventive care for women. You should practice "breast self-awareness."  This means understanding the normal appearance and feel of your breasts and may include breast self-examination.  Any changes detected, no matter how small, should be reported to a health care provider.  Women in their 80s and 30s should have a clinical breast exam (CBE) by a health care provider as part of a regular health exam every 1 to 3 years.  After age 66, women should have a CBE every year.  Starting at age 1, women should consider having a mammogram (breast X-ray test) every year.  Women who have a family history of breast cancer should talk to their health care provider about genetic screening.  Women at a high risk of breast cancer should talk to their health care providers about having an MRI and a mammogram every year.   -Breast cancer gene (BRCA)-related cancer risk assessment is recommended for women who have family members with BRCA-related cancers. BRCA-related cancers include breast, ovarian, tubal, and peritoneal cancers. Having family members with these cancers may be associated with an increased risk for harmful changes (mutations) in the breast cancer genes BRCA1 and  BRCA2. Results of the assessment will determine the need for genetic counseling and BRCA1 and BRCA2 testing.   The Pap test is a screening test for cervical cancer. A Pap test can show cell changes on the cervix that might become cervical cancer if left untreated. A Pap test is a procedure in which cells are obtained and examined from the lower end of the uterus (cervix).   - Women should have a Pap test starting at age 57.   - Between ages 90 and 70, Pap tests should be repeated every 2 years.   - Beginning at age 63, you should have a Pap test every 3 years as long as the past 3 Pap tests have been normal.   - Some women have medical problems that increase the chance of getting cervical cancer. Talk to your health care provider about these problems. It is especially important to talk to your health care provider if a  new problem develops soon after your last Pap test. In these cases, your health care provider may recommend more frequent screening and Pap tests.   - The above recommendations are the same for women who have or have not gotten the vaccine for human papillomavirus (HPV).   - If you had a hysterectomy for a problem that was not cancer or a condition that could lead to cancer, then you no longer need Pap tests. Even if you no longer need a Pap test, a regular exam is a good idea to make sure no other problems are starting.   - If you are between ages 36 and 66 years, and you have had normal Pap tests going back 10 years, you no longer need Pap tests. Even if you no longer need a Pap test, a regular exam is a good idea to make sure no other problems are starting.   - If you have had past treatment for cervical cancer or a condition that could lead to cancer, you need Pap tests and screening for cancer for at least 20 years after your treatment.   - If Pap tests have been discontinued, risk factors (such as a new sexual partner) need to be reassessed to determine if screening should  be resumed.   - The HPV test is an additional test that may be used for cervical cancer screening. The HPV test looks for the virus that can cause the cell changes on the cervix. The cells collected during the Pap test can be tested for HPV. The HPV test could be used to screen women aged 70 years and older, and should be used in women of any age who have unclear Pap test results. After the age of 67, women should have HPV testing at the same frequency as a Pap test.   Colorectal cancer can be detected and often prevented. Most routine colorectal cancer screening begins at the age of 57 years and continues through age 26 years. However, your health care provider may recommend screening at an earlier age if you have risk factors for colon cancer. On a yearly basis, your health care provider may provide home test kits to check for hidden blood in the stool.  Use of a small camera at the end of a tube, to directly examine the colon (sigmoidoscopy or colonoscopy), can detect the earliest forms of colorectal cancer. Talk to your health care provider about this at age 23, when routine screening begins. Direct exam of the colon should be repeated every 5 -10 years through age 49 years, unless early forms of pre-cancerous polyps or small growths are found.   People who are at an increased risk for hepatitis B should be screened for this virus. You are considered at high risk for hepatitis B if:  -You were born in a country where hepatitis B occurs often. Talk with your health care provider about which countries are considered high risk.  - Your parents were born in a high-risk country and you have not received a shot to protect against hepatitis B (hepatitis B vaccine).  - You have HIV or AIDS.  - You use needles to inject street drugs.  - You live with, or have sex with, someone who has Hepatitis B.  - You get hemodialysis treatment.  - You take certain medicines for conditions like cancer, organ  transplantation, and autoimmune conditions.   Hepatitis C blood testing is recommended for all people born from [birth year] through 1965 and any individual  with known risks for hepatitis C.   Practice safe sex. Use condoms and avoid high-risk sexual practices to reduce the spread of sexually transmitted infections (STIs). STIs include gonorrhea, chlamydia, syphilis, trichomonas, herpes, HPV, and human immunodeficiency virus (HIV). Herpes, HIV, and HPV are viral illnesses that have no cure. They can result in disability, cancer, and death. Sexually active women aged 25 years and younger should be checked for chlamydia. Older women with new or multiple partners should also be tested for chlamydia. Testing for other STIs is recommended if you are sexually active and at increased risk.   Osteoporosis is a disease in which the bones lose minerals and strength with aging. This can result in serious bone fractures or breaks. The risk of osteoporosis can be identified using a bone density scan. Women ages 65 years and over and women at risk for fractures or osteoporosis should discuss screening with their health care providers. Ask your health care provider whether you should take a calcium supplement or vitamin D to There are also several preventive steps women can take to avoid osteoporosis and resulting fractures or to keep osteoporosis from worsening. -->Recommendations include:  Eat a balanced diet high in fruits, vegetables, calcium, and vitamins.  Get enough calcium. The recommended total intake of is 1,200 mg daily; for best absorption, if taking supplements, divide doses into 250-500 mg doses throughout the day. Of the two types of calcium, calcium carbonate is best absorbed when taken with food but calcium citrate can be taken on an empty stomach.  Get enough vitamin D. NAMS and the National Osteoporosis Foundation recommend at least 1,000 IU per day for women age 50 and over who are at risk of vitamin D  deficiency. Vitamin D deficiency can be caused by inadequate sun exposure (for example, those who live in northern latitudes).  Avoid alcohol and smoking. Heavy alcohol intake (more than 7 drinks per week) increases the risk of falls and hip fracture and women smokers tend to lose bone more rapidly and have lower bone mass than nonsmokers. Stopping smoking is one of the most important changes women can make to improve their health and decrease risk for disease.  Be physically active every day. Weight-bearing exercise (for example, fast walking, hiking, jogging, and weight training) may strengthen bones or slow the rate of bone loss that comes with aging. Balancing and muscle-strengthening exercises can reduce the risk of falling and fracture.  Consider therapeutic medications. Currently, several types of effective drugs are available. Healthcare providers can recommend the type most appropriate for each woman.  Eliminate environmental factors that may contribute to accidents. Falls cause nearly 90% of all osteoporotic fractures, so reducing this risk is an important bone-health strategy. Measures include ample lighting, removing obstructions to walking, using nonskid rugs on floors, and placing mats and/or grab bars in showers.  Be aware of medication side effects. Some common medicines make bones weaker. These include a type of steroid drug called glucocorticoids used for arthritis and asthma, some antiseizure drugs, certain sleeping pills, treatments for endometriosis, and some cancer drugs. An overactive thyroid gland or using too much thyroid hormone for an underactive thyroid can also be a problem. If you are taking these medicines, talk to your doctor about what you can do to help protect your bones.reduce the rate of osteoporosis.    Menopause can be associated with physical symptoms and risks. Hormone replacement therapy is available to decrease symptoms and risks. You should talk to your  health care provider   about whether hormone replacement therapy is right for you.   Use sunscreen. Apply sunscreen liberally and repeatedly throughout the day. You should seek shade when your shadow is shorter than you. Protect yourself by wearing long sleeves, pants, a wide-brimmed hat, and sunglasses year round, whenever you are outdoors.   Once a month, do a whole body skin exam, using a mirror to look at the skin on your back. Tell your health care provider of new moles, moles that have irregular borders, moles that are larger than a pencil eraser, or moles that have changed in shape or color.   -Stay current with required vaccines (immunizations).   Influenza vaccine. All adults should be immunized every year.  Tetanus, diphtheria, and acellular pertussis (Td, Tdap) vaccine. Pregnant women should receive 1 dose of Tdap vaccine during each pregnancy. The dose should be obtained regardless of the length of time since the last dose. Immunization is preferred during the 27th 36th week of gestation. An adult who has not previously received Tdap or who does not know her vaccine status should receive 1 dose of Tdap. This initial dose should be followed by tetanus and diphtheria toxoids (Td) booster doses every 10 years. Adults with an unknown or incomplete history of completing a 3-dose immunization series with Td-containing vaccines should begin or complete a primary immunization series including a Tdap dose. Adults should receive a Td booster every 10 years.  Varicella vaccine. An adult without evidence of immunity to varicella should receive 2 doses or a second dose if she has previously received 1 dose. Pregnant females who do not have evidence of immunity should receive the first dose after pregnancy. This first dose should be obtained before leaving the health care facility. The second dose should be obtained 4 8 weeks after the first dose.  Human papillomavirus (HPV) vaccine. Females aged 13 26  years who have not received the vaccine previously should obtain the 3-dose series. The vaccine is not recommended for use in pregnant females. However, pregnancy testing is not needed before receiving a dose. If a female is found to be pregnant after receiving a dose, no treatment is needed. In that case, the remaining doses should be delayed until after the pregnancy. Immunization is recommended for any person with an immunocompromised condition through the age of 26 years if she did not get any or all doses earlier. During the 3-dose series, the second dose should be obtained 4 8 weeks after the first dose. The third dose should be obtained 24 weeks after the first dose and 16 weeks after the second dose.  Zoster vaccine. One dose is recommended for adults aged 60 years or older unless certain conditions are present.  Measles, mumps, and rubella (MMR) vaccine. Adults born before 1957 generally are considered immune to measles and mumps. Adults born in 1957 or later should have 1 or more doses of MMR vaccine unless there is a contraindication to the vaccine or there is laboratory evidence of immunity to each of the three diseases. A routine second dose of MMR vaccine should be obtained at least 28 days after the first dose for students attending postsecondary schools, health care workers, or international travelers. People who received inactivated measles vaccine or an unknown type of measles vaccine during 1963 1967 should receive 2 doses of MMR vaccine. People who received inactivated mumps vaccine or an unknown type of mumps vaccine before 1979 and are at high risk for mumps infection should consider immunization with 2 doses of   MMR vaccine. For females of childbearing age, rubella immunity should be determined. If there is no evidence of immunity, females who are not pregnant should be vaccinated. If there is no evidence of immunity, females who are pregnant should delay immunization until after pregnancy.  Unvaccinated health care workers born before [birth year] who lack laboratory evidence of measles, mumps, or rubella immunity or laboratory confirmation of disease should consider measles and mumps immunization with 2 doses of MMR vaccine or rubella immunization with 1 dose of MMR vaccine.  Pneumococcal 13-valent conjugate (PCV13) vaccine. When indicated, a person who is uncertain of her immunization history and has no record of immunization should receive the PCV13 vaccine. An adult aged 54 years or older who has certain medical conditions and has not been previously immunized should receive 1 dose of PCV13 vaccine. This PCV13 should be followed with a dose of pneumococcal polysaccharide (PPSV23) vaccine. The PPSV23 vaccine dose should be obtained at least 8 weeks after the dose of PCV13 vaccine. An adult aged 58 years or older who has certain medical conditions and previously received 1 or more doses of PPSV23 vaccine should receive 1 dose of PCV13. The PCV13 vaccine dose should be obtained 1 or more years after the last PPSV23 vaccine dose.  Pneumococcal polysaccharide (PPSV23) vaccine. When PCV13 is also indicated, PCV13 should be obtained first. All adults aged 58 years and older should be immunized. An adult younger than age 65 years who has certain medical conditions should be immunized. Any person who resides in a nursing home or long-term care facility should be immunized. An adult smoker should be immunized. People with an immunocompromised condition and certain other conditions should receive both PCV13 and PPSV23 vaccines. People with human immunodeficiency virus (HIV) infection should be immunized as soon as possible after diagnosis. Immunization during chemotherapy or radiation therapy should be avoided. Routine use of PPSV23 vaccine is not recommended for American Indians, [place] Natives, or people younger than 65 years unless there are medical conditions that require PPSV23 vaccine. When indicated,  people who have unknown immunization and have no record of immunization should receive PPSV23 vaccine. One-time revaccination 5 years after the first dose of PPSV23 is recommended for people aged 70 64 years who have chronic kidney failure, nephrotic syndrome, asplenia, or immunocompromised conditions. People who received 1 2 doses of PPSV23 before age 32 years should receive another dose of PPSV23 vaccine at age 96 years or later if at least 5 years have passed since the previous dose. Doses of PPSV23 are not needed for people immunized with PPSV23 at or after age 55 years.  Meningococcal vaccine. Adults with asplenia or persistent complement component deficiencies should receive 2 doses of quadrivalent meningococcal conjugate (MenACWY-D) vaccine. The doses should be obtained at least 2 months apart. Microbiologists working with certain meningococcal bacteria, [employer] recruits, people at risk during an outbreak, and people who travel to or live in countries with a high rate of meningitis should be immunized. A first-year college student up through age 58 years who is living in a residence hall should receive a dose if she did not receive a dose on or after her 16th birthday. Adults who have certain high-risk conditions should receive one or more doses of vaccine.  Hepatitis A vaccine. Adults who wish to be protected from this disease, have certain high-risk conditions, work with hepatitis A-infected animals, work in hepatitis A research labs, or travel to or work in countries with a high rate of hepatitis A should be  immunized. Adults who were previously unvaccinated and who anticipate close contact with an international adoptee during the first 60 days after arrival in the [place] from a country with a high rate of hepatitis A should be immunized.  Hepatitis B vaccine.  Adults who wish to be protected from this disease, have certain high-risk conditions, may be exposed to blood or other infectious  body fluids, are household contacts or sex partners of hepatitis B positive people, are clients or workers in certain care facilities, or travel to or work in countries with a high rate of hepatitis B should be immunized.  Haemophilus influenzae type b (Hib) vaccine. A previously unvaccinated person with asplenia or sickle cell disease or having a scheduled splenectomy should receive 1 dose of Hib vaccine. Regardless of previous immunization, a recipient of a hematopoietic stem cell transplant should receive a 3-dose series 6 12 months after her successful transplant. Hib vaccine is not recommended for adults with HIV infection.  Preventive Services / Frequency Ages 6 to 39years  Blood pressure check.** / Every 1 to 2 years.  Lipid and cholesterol check.** / Every 5 years beginning at age 39.  Clinical breast exam.** / Every 3 years for women in their 61s and 62s.  BRCA-related cancer risk assessment.** / For women who have family members with a BRCA-related cancer (breast, ovarian, tubal, or peritoneal cancers).  Pap test.** / Every 2 years from ages 47 through 85. Every 3 years starting at age 34 through age 12 or 74 with a history of 3 consecutive normal Pap tests.  HPV screening.** / Every 3 years from ages 46 through ages 43 to 54 with a history of 3 consecutive normal Pap tests.  Hepatitis C blood test.** / For any individual with known risks for hepatitis C.  Skin self-exam. / Monthly.  Influenza vaccine. / Every year.  Tetanus, diphtheria, and acellular pertussis (Tdap, Td) vaccine.** / Consult your health care provider. Pregnant women should receive 1 dose of Tdap vaccine during each pregnancy. 1 dose of Td every 10 years.  Varicella vaccine.** / Consult your health care provider. Pregnant females who do not have evidence of immunity should receive the first dose after pregnancy.  HPV vaccine. / 3 doses over 6 months, if 64 and younger. The vaccine is not recommended for use in  pregnant females. However, pregnancy testing is not needed before receiving a dose.  Measles, mumps, rubella (MMR) vaccine.** / You need at least 1 dose of MMR if you were born in 1957 or later. You may also need a 2nd dose. For females of childbearing age, rubella immunity should be determined. If there is no evidence of immunity, females who are not pregnant should be vaccinated. If there is no evidence of immunity, females who are pregnant should delay immunization until after pregnancy.  Pneumococcal 13-valent conjugate (PCV13) vaccine.** / Consult your health care provider.  Pneumococcal polysaccharide (PPSV23) vaccine.** / 1 to 2 doses if you smoke cigarettes or if you have certain conditions.  Meningococcal vaccine.** / 1 dose if you are age 71 to 37 years and a Market researcher living in a residence hall, or have one of several medical conditions, you need to get vaccinated against meningococcal disease. You may also need additional booster doses.  Hepatitis A vaccine.** / Consult your health care provider.  Hepatitis B vaccine.** / Consult your health care provider.  Haemophilus influenzae type b (Hib) vaccine.** / Consult your health care provider.  Ages 55 to 64years  Blood pressure check.** / Every 1 to 2 years.  Lipid and cholesterol check.** / Every 5 years beginning at age 20 years.  Lung cancer screening. / Every year if you are aged 55 80 years and have a 30-pack-year history of smoking and currently smoke or have quit within the past 15 years. Yearly screening is stopped once you have quit smoking for at least 15 years or develop a health problem that would prevent you from having lung cancer treatment.  Clinical breast exam.** / Every year after age 40 years.  BRCA-related cancer risk assessment.** / For women who have family members with a BRCA-related cancer (breast, ovarian, tubal, or peritoneal cancers).  Mammogram.** / Every year beginning at age 40  years and continuing for as long as you are in good health. Consult with your health care provider.  Pap test.** / Every 3 years starting at age 30 years through age 65 or 70 years with a history of 3 consecutive normal Pap tests.  HPV screening.** / Every 3 years from ages 30 years through ages 65 to 70 years with a history of 3 consecutive normal Pap tests.  Fecal occult blood test (FOBT) of stool. / Every year beginning at age 50 years and continuing until age 75 years. You may not need to do this test if you get a colonoscopy every 10 years.  Flexible sigmoidoscopy or colonoscopy.** / Every 5 years for a flexible sigmoidoscopy or every 10 years for a colonoscopy beginning at age 50 years and continuing until age 75 years.  Hepatitis C blood test.** / For all people born from 1945 through 1965 and any individual with known risks for hepatitis C.  Skin self-exam. / Monthly.  Influenza vaccine. / Every year.  Tetanus, diphtheria, and acellular pertussis (Tdap/Td) vaccine.** / Consult your health care provider. Pregnant women should receive 1 dose of Tdap vaccine during each pregnancy. 1 dose of Td every 10 years.  Varicella vaccine.** / Consult your health care provider. Pregnant females who do not have evidence of immunity should receive the first dose after pregnancy.  Zoster vaccine.** / 1 dose for adults aged 60 years or older.  Measles, mumps, rubella (MMR) vaccine.** / You need at least 1 dose of MMR if you were born in 1957 or later. You may also need a 2nd dose. For females of childbearing age, rubella immunity should be determined. If there is no evidence of immunity, females who are not pregnant should be vaccinated. If there is no evidence of immunity, females who are pregnant should delay immunization until after pregnancy.  Pneumococcal 13-valent conjugate (PCV13) vaccine.** / Consult your health care provider.  Pneumococcal polysaccharide (PPSV23) vaccine.** / 1 to 2 doses if  you smoke cigarettes or if you have certain conditions.  Meningococcal vaccine.** / Consult your health care provider.  Hepatitis A vaccine.** / Consult your health care provider.  Hepatitis B vaccine.** / Consult your health care provider.  Haemophilus influenzae type b (Hib) vaccine.** / Consult your health care provider.  Ages 65 years and over  Blood pressure check.** / Every 1 to 2 years.  Lipid and cholesterol check.** / Every 5 years beginning at age 20 years.  Lung cancer screening. / Every year if you are aged 55 80 years and have a 30-pack-year history of smoking and currently smoke or have quit within the past 15 years. Yearly screening is stopped once you have quit smoking for at least 15 years or develop a health problem that   would prevent you from having lung cancer treatment.  Clinical breast exam.** / Every year after age 103 years.  BRCA-related cancer risk assessment.** / For women who have family members with a BRCA-related cancer (breast, ovarian, tubal, or peritoneal cancers).  Mammogram.** / Every year beginning at age 36 years and continuing for as long as you are in good health. Consult with your health care provider.  Pap test.** / Every 3 years starting at age 5 years through age 85 or 10 years with 3 consecutive normal Pap tests. Testing can be stopped between 65 and 70 years with 3 consecutive normal Pap tests and no abnormal Pap or HPV tests in the past 10 years.  HPV screening.** / Every 3 years from ages 93 years through ages 70 or 45 years with a history of 3 consecutive normal Pap tests. Testing can be stopped between 65 and 70 years with 3 consecutive normal Pap tests and no abnormal Pap or HPV tests in the past 10 years.  Fecal occult blood test (FOBT) of stool. / Every year beginning at age 8 years and continuing until age 45 years. You may not need to do this test if you get a colonoscopy every 10 years.  Flexible sigmoidoscopy or colonoscopy.** /  Every 5 years for a flexible sigmoidoscopy or every 10 years for a colonoscopy beginning at age 69 years and continuing until age 68 years.  Hepatitis C blood test.** / For all people born from [birth year] through 1966 and any individual with known risks for hepatitis C.  Osteoporosis screening.** / A one-time screening for women ages 7 years and over and women at risk for fractures or osteoporosis.  Skin self-exam. / Monthly.  Influenza vaccine. / Every year.  Tetanus, diphtheria, and acellular pertussis (Tdap/Td) vaccine.** / 1 dose of Td every 10 years.  Varicella vaccine.** / Consult your health care provider.  Zoster vaccine.** / 1 dose for adults aged 5 years or older.  Pneumococcal 13-valent conjugate (PCV13) vaccine.** / Consult your health care provider.  Pneumococcal polysaccharide (PPSV23) vaccine.** / 1 dose for all adults aged 74 years and older.  Meningococcal vaccine.** / Consult your health care provider.  Hepatitis A vaccine.** / Consult your health care provider.  Hepatitis B vaccine.** / Consult your health care provider.  Haemophilus influenzae type b (Hib) vaccine.** / Consult your health care provider. ** Family history and personal history of risk and conditions may change your health care provider's recommendations. Document Released: 09/14/2001 Document Revised: 05/09/2013  [facility] Patient Information 2014 [place], [place].   EXERCISE AND DIET:  We recommended that you start or continue a regular exercise program for good health. Regular exercise means any activity that makes your heart beat faster and makes you sweat.  We recommend exercising at least 30 minutes per day at least 3 days a week, preferably 5.  We also recommend a diet low in fat and sugar / carbohydrates.  Inactivity, poor dietary choices and obesity can cause diabetes, heart attack, stroke, and kidney damage, among others.     ALCOHOL AND SMOKING:  Women should limit their alcohol intake to no  more than 7 drinks/beers/glasses of wine (combined, not each!) per week. Moderation of alcohol intake to this level decreases your risk of breast cancer and liver damage.  ( And of course, no recreational drugs are part of a healthy lifestyle.)  Also, you should not be smoking at all or even being exposed to second hand smoke. Most people know smoking can  cause cancer, and various heart and lung diseases, but did you know it also contributes to weakening of your bones?  Aging of your skin?  Yellowing of your teeth and nails?   CALCIUM AND VITAMIN D:  Adequate intake of calcium and Vitamin D are recommended.  The recommendations for exact amounts of these supplements seem to change often, but generally speaking 600 mg of calcium (either carbonate or citrate) and 800 units of Vitamin D per day seems prudent. Certain women may benefit from higher intake of Vitamin D.  If you are among these women, your doctor will have told you during your visit.     PAP SMEARS:  Pap smears, to check for cervical cancer or precancers,  have traditionally been done yearly, although recent scientific advances have shown that most women can have pap smears less often.  However, every woman still should have a physical exam from her gynecologist or primary care physician every year. It will include a breast check, inspection of the vulva and vagina to check for abnormal growths or skin changes, a visual exam of the cervix, and then an exam to evaluate the size and shape of the uterus and ovaries.  And after 43 years of age, a rectal exam is indicated to check for rectal cancers. We will also provide age appropriate advice regarding health maintenance, like when you should have certain vaccines, screening for sexually transmitted diseases, bone density testing, colonoscopy, mammograms, etc.    MAMMOGRAMS:  All women over 37 years old should have a yearly mammogram. Many facilities now offer a "3D" mammogram, which may cost  around $50 extra out of pocket. If possible,  we recommend you accept the option to have the 3D mammogram performed.  It both reduces the number of women who will be called back for extra views which then turn out to be normal, and it is better than the routine mammogram at detecting truly abnormal areas.     COLONOSCOPY:  Colonoscopy to screen for colon cancer is recommended for all women at age 11.  We know, you hate the idea of the prep.  We agree, BUT, having colon cancer and not knowing it is worse!!  Colon cancer so often starts as a polyp that can be seen and removed at colonscopy, which can quite literally save your life!  And if your first colonoscopy is normal and you have no family history of colon cancer, most women don't have to have it again for 10 years.  Once every ten years, you can do something that may end up saving your life, right?  We will be happy to help you get it scheduled when you are ready.  Be sure to check your insurance coverage so you understand how much it will cost.  It may be covered as a preventative service at no cost, but you should check your particular policy.    Increase water intake, strive for at leas 85 ounces/day.   Follow Mediterranean diet Increase regular exercise.  Recommend at least 30 minutes daily, 5 days per week of walking, jogging, biking, swimming, YouTube/Pinterest workout videos. Recommend Lose It App. Referral to Gastroenterology placed, you need you.  Nicoderm patch sent. Please schedule follow-up 3 months, re: medical weight loss. NICE TO SEE YOU!

## 2018-07-27 DIAGNOSIS — C44519 Basal cell carcinoma of skin of other part of trunk: Secondary | ICD-10-CM | POA: Diagnosis not present

## 2018-08-11 ENCOUNTER — Encounter: Payer: Self-pay | Admitting: Internal Medicine

## 2018-08-11 ENCOUNTER — Ambulatory Visit (INDEPENDENT_AMBULATORY_CARE_PROVIDER_SITE_OTHER): Payer: BLUE CROSS/BLUE SHIELD | Admitting: Internal Medicine

## 2018-08-11 VITALS — BP 102/62 | HR 91 | Ht 63.0 in | Wt 243.0 lb

## 2018-08-11 DIAGNOSIS — Z860100 Personal history of colon polyps, unspecified: Secondary | ICD-10-CM

## 2018-08-11 DIAGNOSIS — K7581 Nonalcoholic steatohepatitis (NASH): Secondary | ICD-10-CM

## 2018-08-11 DIAGNOSIS — K219 Gastro-esophageal reflux disease without esophagitis: Secondary | ICD-10-CM

## 2018-08-11 DIAGNOSIS — R7989 Other specified abnormal findings of blood chemistry: Secondary | ICD-10-CM

## 2018-08-11 DIAGNOSIS — R945 Abnormal results of liver function studies: Secondary | ICD-10-CM

## 2018-08-11 DIAGNOSIS — Z720 Tobacco use: Secondary | ICD-10-CM

## 2018-08-11 MED ORDER — PEG-KCL-NACL-NASULF-NA ASC-C 140 G PO SOLR: 1.0000 | Freq: Once | ORAL | 0 refills | Status: AC

## 2018-08-11 NOTE — Progress Notes (Signed)
HISTORY OF PRESENT ILLNESS:  Meghan Wilcox is a 43 y.o. female with, morbid obesity, biopsy-proven NASH, GERD, gastroparesis on gastric emptying study, prior colonoscopy, and a history of multiple adenomatous and sessile serrated polyps.  Last evaluated 3 years ago.  She is sent today with concerns over a possible abdominal hernia and the need for surveillance colonoscopy.  The patient recently discontinued all of her regular medications.  She is prediabetic with hemoglobin A1c 5.08 June 2018.  Blood work at that same time revealed elevated liver tests with AST 49 and ALT 43.  Normal CBC except for platelets of 148,000.  She has not had weight loss and does not exercise.  She smokes.  Her last colonoscopy 4 years ago revealed multiple polyps which were both adenomatous and sessile serrated.  Follow-up in 3 years recommended.  Also moderate diverticulosis.  In terms of the patient's concern over hernia she mentions her abdomen protruding with Valsalva or sitting.  Tells me that she did see a surgeon previously.  She is not sure what was the result of that evaluation but did not have a positive interaction.  GI review of systems at this time is remarkable for alternating bowel habits, bloating with gas, postprandial fullness, and belching.  In addition to previously reviewed laboratories, CT imaging from February 2016 demonstrated diffuse fatty infiltration of the liver.  REVIEW OF SYSTEMS:  All non-GI ROS negative unless otherwise stated in the HPI except for anxiety, back pain, fatigue  Past Medical History:  Diagnosis Date  . Anxiety   . Blood transfusion age 43yr   Antibody scewwn neg. 08/20/10 prior to cholecystectomy  . Depression   . Diverticulitis   . Esophagitis   . Family history of malignant neoplasm of gastrointestinal tract   . Fatty liver   . Gastroparesis   . GERD (gastroesophageal reflux disease)   . Hepatomegaly   . Hiatal hernia   . History of ovarian cyst   . Scoliosis    . Seizures ([place]    post part  eclampsia  13yrago    Past Surgical History:  Procedure Laterality Date  . BLADDER SUSPENSION  10/01/2011   Procedure: TRANSVAGINAL TAPE (TVT) PROCEDURE;  Surgeon: ToClarene DukeMD;  [facility];  Service: Gynecology;  Laterality: N/A;  Solyx Sling  . CHOLECYSTECTOMY  jan 2012   Dr AmRonnald Collum. CYSTOSCOPY  10/01/2011   Procedure: CYSTOSCOPY;  Surgeon: ToClarene DukeMD;  [facility];  Service: Gynecology;  Laterality: N/A;  . INNER EAR SURGERY Left   . KNEE SURGERY Left   . LAPAROSCOPY  10/01/2011   Procedure: LAPAROSCOPY OPERATIVE;  Surgeon: ToClarene DukeMD;  [facility];  Service: Gynecology;  Laterality: N/A;  removal of right fimbria, fulgeration of endometriosis  . Steel rod in her back  age 43 scoliosis  . TONSILLECTOMY    . TUBAL LIGATION    . uterine ablation    . WISDOM TOOTH EXTRACTION      Social History [name] that she has been smoking cigarettes. She has a 30.00 pack-year smoking history. She has never used smokeless tobacco. She reports current alcohol use. She reports that she does not use drugs.  family history includes Cirrhosis in her father and mother; Colon cancer (age of onset: 7047in her paternal grandfather; Crohn's disease in her mother; Diabetes in her brother, father, and mother; Heart disease in her father and mother; Irritable bowel syndrome  in her mother; Liver disease in her father; Lung cancer in her mother.  No Known Allergies     PHYSICAL EXAMINATION: Vital signs: BP 102/62   Pulse 91   Ht 5' 3"  (1.6 m)   Wt 243 lb (110.2 kg)   BMI 43.05 kg/m   Constitutional: generally well-appearing, no acute distress Psychiatric: alert and oriented x3, cooperative Eyes: extraocular movements intact, anicteric, conjunctiva pink Mouth: oral pharynx moist, no lesions Neck: supple no lymphadenopathy Cardiovascular: heart regular rate and rhythm, no murmur Lungs: clear to  auscultation bilaterally Abdomen: soft, nontender, nondistended, no obvious ascites, no peritoneal signs, normal bowel sounds, no organomegaly.  Obese.  No abdominal hernia.  Midline abdominal diastases Rectal: Deferred until colonoscopy Extremities: no clubbing, cyanosis, or lower extremity edema bilaterally Skin: no lesions on visible extremities Neuro: No focal deficits. No asterixis.  Cranial nerves intact  ASSESSMENT:  1.  No abdominal hernia.  Midline abdominal diastases 2.  Biopsy-proven NASH.  Elevated liver test.  Concerns over progression to cirrhosis. 3.  Elevated liver test secondary to NASH 4.  Morbid obesity 5.  Chronic tobacco abuse 6.  GERD.  Denies symptoms at this time off medication 7.  History of multiple polyps both adenomatous and sessile serrated.  Overdue for follow-up 8.  Multiple medical problems.  Metabolic syndrome 9.  History of gastroparesis  PLAN:  1.  Discussed the importance of exercise and weight loss with regards to her liver disease.  Discussed the serious nature of NASH with progression to cirrhosis and its inherent complications 2.  Reflux precautions.  Reviewed and discussed 3.  Stop smoking.  Stressed and discussed 4.  Schedule surveillance colonoscopy.  The patient is high risk given her comorbidities and body habitus.  She will require monitored anesthesia care.The nature of the procedure, as well as the risks, benefits, and alternatives were carefully and thoroughly reviewed with the patient. Ample time for discussion and questions allowed. The patient understood, was satisfied, and agreed to proceed. 5.  Referred her to a bariatrics seminar for educational purposes

## 2018-08-11 NOTE — Patient Instructions (Signed)

## 2018-08-30 ENCOUNTER — Encounter: Payer: Self-pay | Admitting: Internal Medicine

## 2018-09-06 NOTE — Progress Notes (Signed)
Subjective:    Patient ID: Meghan Wilcox, female    DOB: 1976-05-24, 43 y.o.   MRN: [id number]  HPI:05/09/18 OV:  Meghan Wilcox is here to establish as a new pt.  She is a pleasant 43 year old female. PMH: Pre-diabetes, Fibromyalgia's, Anxiety, depression, obesity, insomnia, tobacco use, and "fatty liver". She stopped taking all of her maintenance medications 3 months ago, "b/c I wanted to see how I felt without all the drugs". She estimates to drink 20 oz plain water/day and 2-3 "cans of coke/day". She eats diet high in saturated fat and CHO She denies regular exercise and smokes pack/day- declined smoking cessation. She feels that her overall health is "fair" and is ready to make a "real lifestyle change" She has worked with Nutritionists in the past and is not interested in going back due to "strong food aversions". She seldom drinks ETOH and report strong support system of family and friends  06/12/18 OV: Meghan Wilcox is here for CPE She continues to use tobacco, currently smoking pack/day She plans on re-starting regular waling regime and finding another zumba class to join She needs referral to different local GI, repeat colonoscopy due 2020 and current hiatal hernia is causing pain and dyspepsia She is willing to try Nicoderm patch She reports 60 lb wt loss 2014 from regular exercise and phentermine  09/12/2018 OV: Meghan Wilcox is here for f/u: medical weight loss She would like to re-start phentermine, she has tolerated well in past Current wt 240, goal 180 She has downloaded the "7 min workout App"- will start today She plans on starting the Nicoderm patch this week- still smoking <pack/day She estimates to drink only 20 oz plain water/day, prefers to hydrate with sweet tea or coke She underwent Colonoscopy yesterday: 3-4 polyps removed, awaiting path report  Patient Care Team    Relationship Specialty Notifications Start End  Mina Marble D, NP PCP - General Family Medicine   05/09/18   Marton Redwood, MD Consulting Physician Internal Medicine  05/09/18   [facility], [facility]    06/12/18     Patient Active Problem List   Diagnosis Date Noted  . Hiatal hernia 06/12/2018  . Healthcare maintenance 05/09/2018  . BMI 40.0-44.9, adult ([place]) 05/09/2018  . Plantar fasciitis of right foot 02/11/2015  . 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 Diarrhea 08/22/2014  . Colitis, acute 08/21/2014  . Leukocytosis 08/21/2014  . Chest pain 03/31/2014  . Ventral hernia 11/12/2013  . SUI (stress urinary incontinence, female) 10/01/2011  . Pelvic pain in female 10/01/2011     Past Medical History:  Diagnosis Date  . Allergy    seasonal  . Anxiety   . Blood transfusion age 18yr   Antibody scewwn neg. 08/20/10 prior to cholecystectomy  . Depression   . Diverticulitis   . Esophagitis   . Family history of malignant neoplasm of gastrointestinal tract   . Fatty liver   . Gastroparesis   . GERD (gastroesophageal reflux disease)   . Hepatomegaly   . Hiatal hernia   . History of ovarian cyst   . Scoliosis   . Seizures ([place]    post part  eclampsia  [place]     Past Surgical History:  Procedure Laterality Date  . BLADDER SUSPENSION  10/01/2011   Procedure: TRANSVAGINAL TAPE (TVT) PROCEDURE;  Surgeon: ToClarene DukeMD;  [facility];  Service: Gynecology;  Laterality: N/A;  Solyx Sling  . CHOLECYSTECTOMY  jan 2012   Dr Ronnald Collum  . CYSTOSCOPY  10/01/2011   Procedure: CYSTOSCOPY;  Surgeon: Clarene Duke, MD;  Location: [facility];  Service: Gynecology;  Laterality: N/A;  . INNER EAR SURGERY Left   . KNEE SURGERY Left   . LAPAROSCOPY  10/01/2011   Procedure: LAPAROSCOPY OPERATIVE;  Surgeon: Clarene Duke, MD;  Location: [facility];  Service: Gynecology;  Laterality: N/A;  removal of right fimbria, fulgeration of endometriosis  . Steel rod in her back  age 53   scoliosis  . TONSILLECTOMY     . TUBAL LIGATION    . uterine ablation    . WISDOM TOOTH EXTRACTION       Family History  Problem Relation Age of Onset  . Diabetes Mother   . Cirrhosis Mother   . Irritable bowel syndrome Mother   . Crohn's disease Mother   . Lung cancer Mother   . Heart disease Mother   . Diabetes Father   . Heart disease Father   . Liver disease Father   . Cirrhosis Father   . Diabetes Brother   . Colon cancer Paternal Grandfather 11  . Colon cancer Other   . Rectal cancer Other   . Esophageal cancer Neg Hx   . Stomach cancer Neg Hx      Social History   Substance and Sexual Activity  Drug Use No     Social History   Substance and Sexual Activity  Alcohol Use Yes  . Alcohol/week: 0.0 standard drinks   Comment: occasional     Social History   Tobacco Use  Smoking Status Current Every Day Smoker  . Packs/day: 1.00  . Years: 30.00  . Pack years: 30.00  . Types: Cigarettes  Smokeless Tobacco Never Used     Outpatient Encounter Medications as of 09/12/2018  Medication Sig  . nicotine (NICODERM CQ - DOSED IN MG/24 HOURS) 14 mg/24hr patch Place 1 patch (14 mg total) onto the skin daily. (Patient not taking: Reported on 08/11/2018)  . phentermine 37.5 MG capsule Take 1 capsule (37.5 mg total) by mouth every morning.   No facility-administered encounter medications on file as of 09/12/2018.     Allergies: Patient has no known allergies.  Body mass index is 42.57 kg/m.  Blood pressure 95/65, pulse 81, temperature 98.5 F (36.9 C), temperature source Oral, height 5' 3"  (1.6 m), weight 240 lb 4.8 oz (109 kg), SpO2 93 %.  Review of Systems  Constitutional: Positive for fatigue. Negative for activity change, appetite change, chills, diaphoresis, fever and unexpected weight change.  Respiratory: Negative for cough, chest tightness, shortness of breath, wheezing and stridor.   Cardiovascular: Negative for chest pain, palpitations and leg swelling.  Gastrointestinal:  Negative for abdominal distention, abdominal pain, blood in stool, constipation, diarrhea, nausea and vomiting.  Musculoskeletal: Positive for arthralgias and myalgias.  Neurological: Negative for dizziness and headaches.  Hematological: Does not bruise/bleed easily.  Psychiatric/Behavioral: Negative for behavioral problems, confusion, decreased concentration, dysphoric mood, hallucinations, self-injury, sleep disturbance and suicidal ideas. The patient is not nervous/anxious and is not hyperactive.        Objective:   Physical Exam Vitals signs and nursing note reviewed.  Constitutional:      General: She is not in acute distress.    Appearance: She is well-developed. She is not diaphoretic.  HENT:     Head: Normocephalic and atraumatic.     Right Ear: No decreased hearing noted. Tympanic  membrane is not erythematous or bulging.     Left Ear: No decreased hearing noted. Tympanic membrane is not erythematous or bulging.     Nose: No mucosal edema.     Right Sinus: No maxillary sinus tenderness or frontal sinus tenderness.     Left Sinus: No maxillary sinus tenderness or frontal sinus tenderness.     Mouth/Throat:     Mouth: Mucous membranes are pale.     Dentition: Normal dentition.     Tonsils: Swelling: 0 on the right. 0 on the left.  Eyes:     Conjunctiva/sclera: Conjunctivae normal.     Pupils: Pupils are equal, round, and reactive to light.  Neck:     Musculoskeletal: Normal range of motion and neck supple.  Cardiovascular:     Rate and Rhythm: Normal rate and regular rhythm.     Heart sounds: Normal heart sounds. No murmur.  Pulmonary:     Effort: Pulmonary effort is normal. No respiratory distress.     Breath sounds: Normal breath sounds. No stridor. No wheezing or rales.  Chest:     Chest wall: No tenderness.  Abdominal:     General: Bowel sounds are normal. There is no distension.     Palpations: Abdomen is soft. There is no mass.     Tenderness: There is no  abdominal tenderness. There is no right CVA tenderness, left CVA tenderness, guarding or rebound.     Hernia: No hernia is present.     Comments: Protuberant abdomen   Lymphadenopathy:     Cervical: No cervical adenopathy.  Skin:    General: Skin is warm and dry.     Capillary Refill: Capillary refill takes less than 2 seconds.     Coloration: Skin is not pale.     Findings: No erythema or rash.  Neurological:     Mental Status: She is alert and oriented to person, place, and time.  Psychiatric:        Behavior: Behavior normal.        Thought Content: Thought content normal.        Judgment: Judgment normal.        Assessment & Plan:   1. BMI 40.0-44.9, adult ([facility])     BMI 40.0-44.9, adult ([facility]) Body mass index is 42.57 kg/m.  Current wt 240 Goal wt 180 [place] Controlled Substance Database reviewed- no aberrancies noted Please start once daily Phentermine. Increase water intake, strive for at least 125 ounces/day.   Follow Mediterranean diet Increase regular exercise.  Recommend at least 30 minutes daily, 5 days per week of walking, jogging, biking, swimming, YouTube/Pinterest workout videos. Please start Nicoderm Patch: reduce to stop tobacco use- you can do it! Follow-up in 4 weeks.    FOLLOW-UP:  Return in about 4 weeks (around 10/10/2018) for Medical Weight Loss.

## 2018-09-11 ENCOUNTER — Encounter: Payer: Self-pay | Admitting: Internal Medicine

## 2018-09-11 ENCOUNTER — Ambulatory Visit (AMBULATORY_SURGERY_CENTER): Payer: BLUE CROSS/BLUE SHIELD | Admitting: Internal Medicine

## 2018-09-11 VITALS — BP 114/66 | HR 80 | Temp 98.9°F | Resp 18 | Ht 63.0 in | Wt 243.0 lb

## 2018-09-11 DIAGNOSIS — K633 Ulcer of intestine: Secondary | ICD-10-CM

## 2018-09-11 DIAGNOSIS — D122 Benign neoplasm of ascending colon: Secondary | ICD-10-CM | POA: Diagnosis not present

## 2018-09-11 DIAGNOSIS — Z860100 Personal history of colon polyps, unspecified: Secondary | ICD-10-CM

## 2018-09-11 DIAGNOSIS — K621 Rectal polyp: Secondary | ICD-10-CM

## 2018-09-11 DIAGNOSIS — D128 Benign neoplasm of rectum: Secondary | ICD-10-CM

## 2018-09-11 MED ORDER — SODIUM CHLORIDE 0.9 % IV SOLN: 500.0000 mL | Freq: Once | INTRAVENOUS | Status: DC

## 2018-09-11 NOTE — Op Note (Signed)
[place] Patient Name: Meghan Wilcox Procedure Date: 09/11/2018 11:40 AM MRN: [id number] Endoscopist: Docia Chuck. Henrene Pastor , MD Age: 43 Referring MD:  Date of Birth: Aug 01, 1976 Gender: Female Account #: [id number] Procedure:                Colonoscopy with cold snare polypectomy x 5; with                            biopsies Indications:              High risk colon cancer surveillance: Personal                            history of multiple (3 or more) adenomas, High risk                            colon cancer surveillance: Personal history of                            sessile serrated colon polyp (less than 10 mm in                            size) with no dysplasia Medicines:                Monitored Anesthesia Care Procedure:                Pre-Anesthesia Assessment:                           - Prior to the procedure, a History and Physical                            was performed, and patient medications and                            allergies were reviewed. The patient's tolerance of                            previous anesthesia was also reviewed. The risks                            and benefits of the procedure and the sedation                            options and risks were discussed with the patient.                            All questions were answered, and informed consent                            was obtained. Prior Anticoagulants: The patient has                            taken no previous anticoagulant or antiplatelet  agents. ASA Grade Assessment: II - A patient with                            mild systemic disease. After reviewing the risks                            and benefits, the patient was deemed in                            satisfactory condition to undergo the procedure.                           After obtaining informed consent, the colonoscope                            was passed under direct vision. Throughout the                             procedure, the patient's blood pressure, pulse, and                            oxygen saturations were monitored continuously. The                            Colonoscope was introduced through the anus and                            advanced to the the cecum, identified by                            appendiceal orifice and ileocecal valve. The                            ileocecal valve, appendiceal orifice, and rectum                            were photographed. The quality of the bowel                            preparation was good. The colonoscopy was performed                            without difficulty. The patient tolerated the                            procedure well. The bowel preparation used was                            SUPREP. Scope In: 11:54:39 AM Scope Out: 12:10:44 PM Scope Withdrawal Time: 0 hours 14 minutes 25 seconds  Total Procedure Duration: 0 hours 16 minutes 5 seconds  Findings:                 Nonspecific benign-appearing focal ulcerated mucosa  were present in the proximal ascending colon.                            Biopsies were taken with a cold forceps for                            histology.                           Five polyps were found in the rectum and ascending                            colon. The polyps were 1 to 5 mm in size. These                            polyps were removed with a cold snare. Resection                            and retrieval were complete.                           Multiple small and large-mouthed diverticula were                            found in the entire colon.                           Internal hemorrhoids were found during                            retroflexion. The hemorrhoids were small.                           The exam was otherwise without abnormality on                            direct and retroflexion views. Complications:            No immediate  complications. Estimated blood loss:                            None. Estimated Blood Loss:     Estimated blood loss: none. Impression:               - Mucosal ulceration. Biopsied.                           - Five 1 to 5 mm polyps in the rectum and in the                            ascending colon, removed with a cold snare.                            Resected and retrieved.                           -  Diverticulosis in the entire examined colon.                           - Internal hemorrhoids.                           - The examination was otherwise normal on direct                            and retroflexion views. Recommendation:           - Repeat colonoscopy in 3 or 5 years for                            surveillance, based on final pathologic results.                           - Patient has a contact number available for                            emergencies. The signs and symptoms of potential                            delayed complications were discussed with the                            patient. Return to normal activities tomorrow.                            Written discharge instructions were provided to the                            patient.                           - Stop smoking                           - Weight loss                           - Resume previous diet.                           - Continue present medications.                           - Await pathology results. Docia Chuck. Henrene Pastor, MD 09/11/2018 12:18:00 PM This report has been signed electronically.

## 2018-09-11 NOTE — Progress Notes (Signed)
Called to room to assist during endoscopic procedure.  Patient ID and intended procedure confirmed with present staff. Received instructions for my participation in the procedure from the performing physician.  

## 2018-09-11 NOTE — Progress Notes (Signed)
To PACU, VSS. Report to [email or web address] 

## 2018-09-11 NOTE — Patient Instructions (Signed)
   Information on polyps ,diverticulosis,& hemorrhoids given to you today   Await pathology results on polyps removed and on biopsies of mucosal ulceration area    YOU HAD AN ENDOSCOPIC PROCEDURE TODAY AT [facility]:   Refer to the procedure report that was given to you for any specific questions about what was found during the examination.  If the procedure report does not answer your questions, please call your gastroenterologist to clarify.  If you requested that your care partner not be given the details of your procedure findings, then the procedure report has been included in a sealed envelope for you to review at your convenience later.  YOU SHOULD EXPECT: Some feelings of bloating in the abdomen. Passage of more gas than usual.  Walking can help get rid of the air that was put into your GI tract during the procedure and reduce the bloating. If you had a lower endoscopy (such as a colonoscopy or flexible sigmoidoscopy) you may notice spotting of blood in your stool or on the toilet paper. If you underwent a bowel prep for your procedure, you may not have a normal bowel movement for a few days.  Please Note:  You might notice some irritation and congestion in your nose or some drainage.  This is from the oxygen used during your procedure.  There is no need for concern and it should clear up in a day or so.  SYMPTOMS TO REPORT IMMEDIATELY:   Following lower endoscopy (colonoscopy or flexible sigmoidoscopy):  Excessive amounts of blood in the stool  Significant tenderness or worsening of abdominal pains  Swelling of the abdomen that is new, acute  Fever of 100F or higher     For urgent or emergent issues, a gastroenterologist can be reached at any hour by calling [phone].   DIET:  We do recommend a small meal at first, but then you may proceed to your regular diet.  Drink plenty of fluids but you should avoid alcoholic beverages for 24 hours.  ACTIVITY:   You should plan to take it easy for the rest of today and you should NOT DRIVE or use heavy machinery until tomorrow (because of the sedation medicines used during the test).    FOLLOW UP: Our staff will call the number listed on your records the next business day following your procedure to check on you and address any questions or concerns that you may have regarding the information given to you following your procedure. If we do not reach you, we will leave a message.  However, if you are feeling well and you are not experiencing any problems, there is no need to return our call.  We will assume that you have returned to your regular daily activities without incident.  If any biopsies were taken you will be contacted by phone or by letter within the next 1-3 weeks.  Please call us at [phone] if you have not heard about the biopsies in 3 weeks.    SIGNATURES/CONFIDENTIALITY: You and/or your care partner have signed paperwork which will be entered into your electronic medical record.  These signatures attest to the fact that that the information above on your After Visit Summary has been reviewed and is understood.  Full responsibility of the confidentiality of this discharge information lies with you and/or your care-partner.

## 2018-09-12 ENCOUNTER — Telehealth: Payer: Self-pay

## 2018-09-12 ENCOUNTER — Ambulatory Visit (INDEPENDENT_AMBULATORY_CARE_PROVIDER_SITE_OTHER): Payer: BLUE CROSS/BLUE SHIELD | Admitting: Adult Health

## 2018-09-12 ENCOUNTER — Encounter: Payer: Self-pay | Admitting: Adult Health

## 2018-09-12 VITALS — BP 95/65 | HR 81 | Temp 98.5°F | Ht 63.0 in | Wt 240.3 lb

## 2018-09-12 DIAGNOSIS — Z6841 Body Mass Index (BMI) 40.0 and over, adult: Secondary | ICD-10-CM

## 2018-09-12 DIAGNOSIS — R635 Abnormal weight gain: Secondary | ICD-10-CM | POA: Diagnosis not present

## 2018-09-12 MED ORDER — PHENTERMINE HCL 37.5 MG PO CAPS: 37.5000 mg | ORAL_CAPSULE | ORAL | 0 refills | Status: DC

## 2018-09-12 NOTE — Telephone Encounter (Signed)
  Follow up Call-  Call back number 09/11/2018  Post procedure Call Back phone  # [phone]  Permission to leave phone message Yes  Some recent data might be hidden     Patient questions:  Do you have a fever, pain , or abdominal swelling? No. Pain Score  0 *  Have you tolerated food without any problems? Yes.    Have you been able to return to your normal activities? Yes.    Do you have any questions about your discharge instructions: Diet   No. Medications  No. Follow up visit  No.  Do you have questions or concerns about your Care? No.  Actions: * If pain score is 4 or above: No action needed, pain <4.

## 2018-09-12 NOTE — Assessment & Plan Note (Signed)
Body mass index is 42.57 kg/m.  Current wt 240 Goal wt 180 [place] Controlled Substance Database reviewed- no aberrancies noted Please start once daily Phentermine. Increase water intake, strive for at least 125 ounces/day.   Follow Mediterranean diet Increase regular exercise.  Recommend at least 30 minutes daily, 5 days per week of walking, jogging, biking, swimming, YouTube/Pinterest workout videos. Please start Nicoderm Patch: reduce to stop tobacco use- you can do it! Follow-up in 4 weeks.

## 2018-09-12 NOTE — Patient Instructions (Signed)
Mediterranean Diet A Mediterranean diet refers to food and lifestyle choices that are based on the traditions of countries located on the [employer]. This way of eating has been shown to help prevent certain conditions and improve outcomes for people who have chronic diseases, like kidney disease and heart disease. What are tips for following this plan? Lifestyle  Cook and eat meals together with your family, when possible.  Drink enough fluid to keep your urine clear or pale yellow.  Be physically active every day. This includes: ? Aerobic exercise like running or swimming. ? Leisure activities like gardening, walking, or housework.  Get 7-8 hours of sleep each night.  If recommended by your health care provider, drink red wine in moderation. This means 1 glass a day for nonpregnant women and 2 glasses a day for men. A glass of wine equals 5 oz (150 mL). Reading food labels   Check the serving size of packaged foods. For foods such as rice and pasta, the serving size refers to the amount of cooked product, not dry.  Check the total fat in packaged foods. Avoid foods that have saturated fat or trans fats.  Check the ingredients list for added sugars, such as corn syrup. Shopping  At the grocery store, buy most of your food from the areas near the walls of the store. This includes: ? Fresh fruits and vegetables (produce). ? Grains, beans, nuts, and seeds. Some of these may be available in unpackaged forms or large amounts (in bulk). ? Fresh seafood. ? Poultry and eggs. ? Low-fat dairy products.  Buy whole ingredients instead of prepackaged foods.  Buy fresh fruits and vegetables in-season from local farmers markets.  Buy frozen fruits and vegetables in resealable bags.  If you do not have access to quality fresh seafood, buy precooked frozen shrimp or canned fish, such as tuna, salmon, or sardines.  Buy small amounts of raw or cooked vegetables, salads, or olives from  the deli or salad bar at your store.  Stock your pantry so you always have certain foods on hand, such as olive oil, canned tuna, canned tomatoes, rice, pasta, and beans. Cooking  Cook foods with extra-virgin olive oil instead of using butter or other vegetable oils.  Have meat as a side dish, and have vegetables or grains as your main dish. This means having meat in small portions or adding small amounts of meat to foods like pasta or stew.  Use beans or vegetables instead of meat in common dishes like chili or lasagna.  Experiment with different cooking methods. Try roasting or broiling vegetables instead of steaming or sauteing them.  Add frozen vegetables to soups, stews, pasta, or rice.  Add nuts or seeds for added healthy fat at each meal. You can add these to yogurt, salads, or vegetable dishes.  Marinate fish or vegetables using olive oil, lemon juice, garlic, and fresh herbs. Meal planning   Plan to eat 1 vegetarian meal one day each week. Try to work up to 2 vegetarian meals, if possible.  Eat seafood 2 or more times a week.  Have healthy snacks readily available, such as: ? Vegetable sticks with hummus. ? [place] yogurt. ? Fruit and nut trail mix.  Eat balanced meals throughout the week. This includes: ? Fruit: 2-3 servings a day ? Vegetables: 4-5 servings a day ? Low-fat dairy: 2 servings a day ? Fish, poultry, or lean meat: 1 serving a day ? Beans and legumes: 2 or more servings a week ?  Nuts and seeds: 1-2 servings a day ? Whole grains: 6-8 servings a day ? Extra-virgin olive oil: 3-4 servings a day  Limit red meat and sweets to only a few servings a month What are my food choices?  Mediterranean diet ? Recommended ? Grains: Whole-grain pasta. Brown rice. Bulgar wheat. Polenta. Couscous. Whole-wheat bread. [place]. ? Vegetables: Artichokes. Beets. Broccoli. Cabbage. Carrots. Eggplant. Green beans. Chard. Kale. Spinach. Onions. Leeks. Peas. Squash.  Tomatoes. Peppers. Radishes. ? Fruits: Apples. Apricots. Avocado. Berries. Bananas. Cherries. Dates. Figs. Grapes. Lemons. Melon. Oranges. Peaches. Plums. Pomegranate. ? Meats and other protein foods: Beans. Almonds. Sunflower seeds. Pine nuts. Peanuts. [place]. Salmon. Scallops. Shrimp. [place]. Tilapia. Clams. Oysters. Eggs. ? Dairy: Low-fat milk. Cheese. Greek yogurt. ? Beverages: Water. Red wine. Herbal tea. ? Fats and oils: Extra virgin olive oil. Avocado oil. Grape seed oil. ? Sweets and desserts: [place] yogurt with honey. Baked apples. Poached pears. Trail mix. ? Seasoning and other foods: Basil. Cilantro. Coriander. Cumin. Mint. Parsley. Sage. Rosemary. Tarragon. Garlic. Oregano. Thyme. Pepper. Balsalmic vinegar. Tahini. Hummus. Tomato sauce. Olives. Mushrooms. ? Limit these ? Grains: Prepackaged pasta or rice dishes. Prepackaged cereal with added sugar. ? Vegetables: Deep fried potatoes (french fries). ? Fruits: Fruit canned in syrup. ? Meats and other protein foods: Beef. Pork. Lamb. Poultry with skin. Hot dogs. [place] Salines. ? Dairy: Ice cream. Sour cream. Whole milk. ? Beverages: Juice. Sugar-sweetened soft drinks. Beer. Liquor and spirits. ? Fats and oils: Butter. Canola oil. Vegetable oil. Beef fat (tallow). Lard. ? Sweets and desserts: Cookies. Cakes. Pies. Candy. ? Seasoning and other foods: Mayonnaise. Premade sauces and marinades. ? The items listed may not be a complete list. Talk with your dietitian about what dietary choices are right for you. Summary  The Mediterranean diet includes both food and lifestyle choices.  Eat a variety of fresh fruits and vegetables, beans, nuts, seeds, and whole grains.  Limit the amount of red meat and sweets that you eat.  Talk with your health care provider about whether it is safe for you to drink red wine in moderation. This means 1 glass a day for nonpregnant women and 2 glasses a day for men. A glass of wine equals 5 oz (150 mL). This information  is not intended to replace advice given to you by your health care provider. Make sure you discuss any questions you have with your health care provider. Document Released: 03/11/2016 Document Revised: 04/13/2016 Document Reviewed: 03/11/2016 Elsevier Interactive Patient Education  2019 [employer].  Please start once daily Phentermine. Increase water intake, strive for at least 125 ounces/day.   Follow Mediterranean diet Increase regular exercise.  Recommend at least 30 minutes daily, 5 days per week of walking, jogging, biking, swimming, YouTube/Pinterest workout videos. Please start Nicoderm Patch: reduce to stop tobacco use- you can do it! Follow-up in 4 weeks. NICE TO SEE YOU!

## 2018-09-12 NOTE — Telephone Encounter (Signed)
Left message on answering machine. 

## 2018-09-13 ENCOUNTER — Encounter: Payer: Self-pay | Admitting: Internal Medicine

## 2018-09-19 ENCOUNTER — Encounter: Payer: Self-pay | Admitting: Family Medicine

## 2018-09-19 ENCOUNTER — Ambulatory Visit (INDEPENDENT_AMBULATORY_CARE_PROVIDER_SITE_OTHER): Payer: BLUE CROSS/BLUE SHIELD | Admitting: Family Medicine

## 2018-09-19 VITALS — BP 109/75 | HR 92 | Temp 98.4°F | Ht 63.0 in | Wt 236.8 lb

## 2018-09-19 DIAGNOSIS — L03119 Cellulitis of unspecified part of limb: Secondary | ICD-10-CM | POA: Diagnosis not present

## 2018-09-19 DIAGNOSIS — F17200 Nicotine dependence, unspecified, uncomplicated: Secondary | ICD-10-CM | POA: Insufficient documentation

## 2018-09-19 DIAGNOSIS — L02419 Cutaneous abscess of limb, unspecified: Secondary | ICD-10-CM

## 2018-09-19 DIAGNOSIS — L0293 Carbuncle, unspecified: Secondary | ICD-10-CM

## 2018-09-19 DIAGNOSIS — L0292 Furuncle, unspecified: Secondary | ICD-10-CM

## 2018-09-19 MED ORDER — DOXYCYCLINE HYCLATE 100 MG PO TABS: 100.0000 mg | ORAL_TABLET | Freq: Two times a day (BID) | ORAL | 0 refills | Status: DC

## 2018-09-19 MED ORDER — DOXYCYCLINE HYCLATE 100 MG PO TABS: 100.0000 mg | ORAL_TABLET | Freq: Two times a day (BID) | ORAL | 0 refills | Status: AC

## 2018-09-19 NOTE — Patient Instructions (Addendum)
Make sure to dress the area with a NON-STICKY bandage before applying a waterproof bandage on top to keep the area closed and contained.  Please make sure you are very careful taking it off as you do not want a pull the antibiotic impregnated gauze out.  Every 3 days, I want you to cut approximately a half of centimeter of gauze from the tail that is still coming out of your wound.  And continue to do that until none is left in the wound.   Fair important to take all your antibiotics.  Please not drink alcohol with them as able make them less effective.  As the wound heals, every three days, take out an eighth of an inch of the sterile packing and clip it off.      Cellulitis, Adult   Cellulitis is a skin infection. The infected area is usually warm, red, swollen, and tender. This condition occurs most often in the arms and lower legs. The infection can travel to the muscles, blood, and underlying tissue and become serious. It is very important to get treated for this condition. What are the causes? Cellulitis is caused by bacteria. The bacteria enter through a break in the skin, such as a cut, burn, insect bite, open sore, or crack. What increases the risk? This condition is more likely to occur in people who:  Have a weak body defense system (immune system).  Have open wounds on the skin, such as cuts, burns, bites, and scrapes. Bacteria can enter the body through these open wounds.  Are older than 44 years of age.  Have diabetes.  Have a type of long-lasting (chronic) liver disease (cirrhosis) or kidney disease.  Are obese.  Have a skin condition such as: ? Itchy rash (eczema). ? Slow movement of blood in the veins (venous stasis). ? Fluid buildup below the skin (edema).  Have had radiation therapy.  Use IV drugs. What are the signs or symptoms? Symptoms of this condition include:  Redness, streaking, or spotting on the skin.  Swollen area of the skin.  Tenderness or  pain when an area of the skin is touched.  Warm skin.  A fever.  Chills.  Blisters. How is this diagnosed? This condition is diagnosed based on a medical history and physical exam. You may also have tests, including:  Blood tests.  Imaging tests. How is this treated? Treatment for this condition may include:  Medicines, such as antibiotic medicines or medicines to treat allergies (antihistamines).  Supportive care, such as rest and application of cold or warm cloths (compresses) to the skin.  Hospital care, if the condition is severe. The infection usually starts to get better within 1-2 days of treatment. Follow these instructions at home:  Medicines  Take over-the-counter and prescription medicines only as told by your health care provider.  If you were prescribed an antibiotic medicine, take it as told by your health care provider. Do not stop taking the antibiotic even if you start to feel better. General instructions  Drink enough fluid to keep your urine pale yellow.  Do not touch or rub the infected area.  Raise (elevate) the infected area above the level of your heart while you are sitting or lying down.  Apply warm or cold compresses to the affected area as told by your health care provider.  Keep all follow-up visits as told by your health care provider. This is important. These visits let your health care provider make sure a more serious  infection is not developing. Contact a health care provider if:  You have a fever.  Your symptoms do not begin to improve within 1-2 days of starting treatment.  Your bone or joint underneath the infected area becomes painful after the skin has healed.  Your infection returns in the same area or another area.  You notice a swollen bump in the infected area.  You develop new symptoms.  You have a general ill feeling (malaise) with muscle aches and pains. Get help right away if:  Your symptoms get worse.  You  feel very sleepy.  You develop vomiting or diarrhea that persists.  You notice red streaks coming from the infected area.  Your red area gets larger or turns dark in color. These symptoms may represent a serious problem that is an emergency. Do not wait to see if the symptoms will go away. Get medical help right away. Call your local emergency services (911 in the [place] Do not drive yourself to the hospital. Summary  Cellulitis is a skin infection. This condition occurs most often in the arms and lower legs.  Treatment for this condition may include medicines, such as antibiotic medicines or antihistamines.  Take over-the-counter and prescription medicines only as told by your health care provider. If you were prescribed an antibiotic medicine, do not stop taking the antibiotic even if you start to feel better.  Contact a health care provider if your symptoms do not begin to improve within 1-2 days of starting treatment or your symptoms get worse.  Keep all follow-up visits as told by your health care provider. This is important. These visits let your health care provider make sure that a more serious infection is not developing. This information is not intended to replace advice given to you by your health care provider. Make sure you discuss any questions you have with your health care provider. Document Released: 04/28/2005 Document Revised: 12/08/2017 Document Reviewed: 12/08/2017 Elsevier Interactive Patient Education  2019 [employer].

## 2018-09-19 NOTE — Progress Notes (Signed)
Impression and Recommendations:    1. Cellulitis and abscess of leg   2. Carbuncle/ furuncle- skin abcess with surrounding cellulitis     1. Cellulitis & Abscess of Leg - Patient was extensively educated in office today prior to procedure.  All questions were answered.  Informed consent obtained.  PROCEDURE: Incision and drainage of abscess Risks, benefits, and alternatives explained and consent obtained. Surface cleaned with chlorhexidine * 4 different ABX sticks 1/2-1 cc 1% lidocaine with epinephine infiltrated superficial surface to abscess. Adequate anesthesia ensured. Area prepped and draped in a sterile fashion. #11 blade used to make a stab incision into abscess. Hemostats used to break up multiple internal septations/ loculations broken up Extensive amnts of pus expressed with very mild pressure. Again, Curved hemostat used to explore 4 quadrants and loculations broken up. Further purulence expressed. 2.5 inches of antibiotic impregnated gauze packing placed leaving a 1/2-cm tail. Hemostasis achieved. Sterile dressing placed EBL: Less than 2 mL  Pt tolerated procedure well; stable post-proc  Wound care, aftercare and follow-up extensively discussed with patient and advised- close f/up recommended.    All questions were answered.  - In the future, advised patient not to squeeze anything resembling a pimple, beginning of boil.  - Antibiotics prescribed today in addition to I & D with ABX impregnated gauze  - Per patient, denies allergies   Meds ordered this encounter  Medications  . doxycycline (VIBRA-TABS) 100 MG tablet    Sig: Take 1 tablet (100 mg total) by mouth 2 (two) times daily for 7 days.    Dispense:  20 tablet    Refill:  0    Gross side effects, risk and benefits, and alternatives of medications and treatment plan in general discussed with patient.  Patient is aware that all medications have potential side effects and we are unable to predict  every side effect or drug-drug interaction that may occur.   Patient will call with any questions prior to using medication if they have concerns.    Expresses verbal understanding and consents to current therapy and treatment regimen.  No barriers to understanding were identified.  Red flag symptoms and signs discussed in detail.  Patient expressed understanding regarding what to do in case of emergency\urgent symptoms  Please see AVS handed out to patient at the end of our visit for further patient instructions/ counseling done pertaining to today's office visit.   Return for Follow-up in 2-3 days for wound check.     Note:  This note was prepared with assistance of Dragon voice recognition software. Occasional wrong-word or sound-a-like substitutions may have occurred due to the inherent limitations of voice recognition software.  This document serves as a record of services personally performed by Mellody Dance, DO. It was created on her behalf by Toni Amend, a trained medical scribe. The creation of this record is based on the scribe's personal observations and the provider's statements to them.   I have reviewed the above medical documentation for accuracy and completeness and I concur.  Mellody Dance, DO 09/19/2018 9:12 PM       ------------------------------------------------------------------------------------------------------------------------------------    Subjective:     HPI: Meghan Wilcox is a 43 y.o. female who presents to [facility] today for issues as discussed below.  The area of concern is on the back of her right thigh, "toward the inner part, but not inner thigh."     First started four or five days  ago, Thursday or Friday night.  Patient said stated out as a pimple.  "I guess I felt it may be Friday night, Friday afternoon, just like a little blood boil."  Notes she occasionally gets these "blood boils" through her  thigh area.   She states she "can usually get them to go away by squeezing them."  Notes she has squeezed the area lately because it's grown- after it got much bigger.  Says "I didn't really mess with this one until around Sunday" (two days ago).  Patient notes that the area is really, really painful.  She has sign pain sitting or with any pressure to area.  Denies F/C, N/V, abd pain or any ill feelings etc.   Wt Readings from Last 3 Encounters:  09/19/18 236 lb 12.8 oz (107.4 kg)  09/12/18 240 lb 4.8 oz (109 kg)  09/11/18 243 lb (110.2 kg)   BP Readings from Last 3 Encounters:  09/19/18 109/75  09/12/18 95/65  09/11/18 114/66   Pulse Readings from Last 3 Encounters:  09/19/18 92  09/12/18 81  09/11/18 80   BMI Readings from Last 3 Encounters:  09/19/18 41.95 kg/m  09/12/18 42.57 kg/m  09/11/18 43.05 kg/m    Patient Care Team    Relationship Specialty Notifications Start End  Mina Marble D, NP PCP - General Family Medicine  05/09/18   Marton Redwood, MD Consulting Physician Internal Medicine  05/09/18   [facility]    06/12/18     Patient Active Problem List   Diagnosis Date Noted  . Smoker 09/19/2018  . Hiatal hernia 06/12/2018  . Healthcare maintenance 05/09/2018  . BMI 40.0-44.9, adult ([place]) 05/09/2018  . Plantar fasciitis of right foot 02/11/2015  . 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 Diarrhea 08/22/2014  . Colitis, acute 08/21/2014  . Leukocytosis 08/21/2014  . Chest pain 03/31/2014  . Ventral hernia 11/12/2013  . SUI (stress urinary incontinence, female) 10/01/2011  . Pelvic pain in female 10/01/2011    Past Medical history, Surgical history, Family history, Social history, Allergies and Medications have been entered into the medical record, reviewed and changed as needed.    Current Meds  Medication Sig  . nicotine (NICODERM CQ - DOSED IN MG/24 HOURS) 14 mg/24hr  patch Place 1 patch (14 mg total) onto the skin daily.  . phentermine 37.5 MG capsule Take 1 capsule (37.5 mg total) by mouth every morning.    Allergies:  No Known Allergies   Review of Systems:  A fourteen system review of systems was performed and found to be positive as per HPI.   Objective:   Blood pressure 109/75, pulse 92, temperature 98.4 F (36.9 C), height 5' 3"  (1.6 m), weight 236 lb 12.8 oz (107.4 kg), SpO2 99 %. Body mass index is 41.95 kg/m. General:  Well Developed, well nourished, appropriate for stated age.  Neuro:  Alert and oriented,  extra-ocular muscles intact  HEENT:  Normocephalic, atraumatic, neck supple, no carotid bruits appreciated  Skin:  See below Vascular:  Ext warm, no cyanosis apprec.; cap RF less 2 sec. Psych:  No HI/SI, judgement and insight good, Euthymic mood. Full Affect. Right LE: Right upper thigh, patient with an 8 cm-sized indurated area of skin with hyper erythema and increased warmth, with a central pus pocket of approximately 3cm by 2 cm

## 2018-09-20 NOTE — Progress Notes (Signed)
Subjective:    Patient ID: Meghan Wilcox, female    DOB: Jun 14, 1976, 43 y.o.   MRN: [id number]  HPI:  Meghan Wilcox is here for wound check- She has been taking Doxycycline as directed, denies pain/fever/night sweats/N/V/D She has not performed wound care yet and has brought her husband today for [place] to demonstrate wound care procedures. Below are notes from I/D on 09/19/2018  PROCEDURE: Incision and drainage of abscess Risks, benefits, and alternatives explained and consent obtained. Surface cleaned with chlorhexidine * 4 different ABX sticks 1/2-1 cc 1% lidocaine with epinephine infiltrated superficial surface to abscess. Adequate anesthesia ensured. Area prepped and draped in a sterile fashion. #11 blade used to make a stab incision into abscess. Hemostats used to break up multiple internal septations/ loculations broken up Extensive amnts of pus expressed with very mild pressure. Again, Curved hemostat used to explore 4 quadrants and loculations broken up. Further purulence expressed. 2.5 inches of antibiotic impregnated gauze packing placed leaving a 1/2-cm tail. Hemostasis achieved. Sterile dressing placed EBL: Less than 2 mL  Patient Care Team    Relationship Specialty Notifications Start End  Mina Marble D, NP PCP - General Family Medicine  05/09/18   Marton Redwood, MD Consulting Physician Internal Medicine  05/09/18   Associates, [facility]    06/12/18     Patient Active Problem List   Diagnosis Date Noted  . Cellulitis and abscess of leg 09/22/2018  . Smoker 09/19/2018  . Hiatal hernia 06/12/2018  . Healthcare maintenance 05/09/2018  . BMI 40.0-44.9, adult ([place]) 05/09/2018  . Plantar fasciitis of right foot 02/11/2015  . 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 Diarrhea 08/22/2014  . Colitis, acute 08/21/2014  . Leukocytosis 08/21/2014  . Chest pain 03/31/2014  . Ventral hernia 11/12/2013   . SUI (stress urinary incontinence, female) 10/01/2011  . Pelvic pain in female 10/01/2011     Past Medical History:  Diagnosis Date  . Allergy    seasonal  . Anxiety   . Blood transfusion age 22yr   Antibody scewwn neg. 08/20/10 prior to cholecystectomy  . Depression   . Diverticulitis   . Esophagitis   . Family history of malignant neoplasm of gastrointestinal tract   . Fatty liver   . Gastroparesis   . GERD (gastroesophageal reflux disease)   . Hepatomegaly   . Hiatal hernia   . History of ovarian cyst   . Scoliosis   . Seizures ([place]    post part  eclampsia  89[place]     Past Surgical History:  Procedure Laterality Date  . BLADDER SUSPENSION  10/01/2011   Procedure: TRANSVAGINAL TAPE (TVT) PROCEDURE;  Surgeon: ToClarene DukeMD;  [facility];  Service: Gynecology;  Laterality: N/A;  Solyx Sling  . CHOLECYSTECTOMY  jan 2012   Dr AmRonnald Collum. CYSTOSCOPY  10/01/2011   Procedure: CYSTOSCOPY;  Surgeon: ToClarene DukeMD;  [facility];  Service: Gynecology;  Laterality: N/A;  . INNER EAR SURGERY Left   . KNEE SURGERY Left   . LAPAROSCOPY  10/01/2011   Procedure: LAPAROSCOPY OPERATIVE;  Surgeon: ToClarene DukeMD;  [facility];  Service: Gynecology;  Laterality: N/A;  removal of right fimbria, fulgeration of endometriosis  . Steel rod in her back  age 43 scoliosis  . TONSILLECTOMY    . TUBAL LIGATION    . uterine ablation    .  WISDOM TOOTH EXTRACTION       Family History  Problem Relation Age of Onset  . Diabetes Mother   . Cirrhosis Mother   . Irritable bowel syndrome Mother   . Crohn's disease Mother   . Lung cancer Mother   . Heart disease Mother   . Diabetes Father   . Heart disease Father   . Liver disease Father   . Cirrhosis Father   . Diabetes Brother   . Colon cancer Paternal Grandfather 37  . Colon cancer Other   . Rectal cancer Other   . Esophageal cancer Neg Hx   . Stomach cancer Neg Hx      Social History    Substance and Sexual Activity  Drug Use No     Social History   Substance and Sexual Activity  Alcohol Use Yes  . Alcohol/week: 0.0 standard drinks   Comment: occasional     Social History   Tobacco Use  Smoking Status Current Every Day Smoker  . Packs/day: 1.00  . Years: 30.00  . Pack years: 30.00  . Types: Cigarettes  Smokeless Tobacco Never Used     Outpatient Encounter Medications as of 09/22/2018  Medication Sig  . doxycycline (VIBRA-TABS) 100 MG tablet Take 1 tablet (100 mg total) by mouth 2 (two) times daily for 10 days.  . nicotine (NICODERM CQ - DOSED IN MG/24 HOURS) 14 mg/24hr patch Place 1 patch (14 mg total) onto the skin daily.  . phentermine 37.5 MG capsule Take 1 capsule (37.5 mg total) by mouth every morning.   No facility-administered encounter medications on file as of 09/22/2018.     Allergies: Patient has no known allergies.  Body mass index is 41.45 kg/m.  Blood pressure 98/65, pulse 86, temperature 98 F (36.7 C), temperature source Oral, height 5' 3"  (1.6 m), weight 234 lb (106.1 kg), SpO2 93 %.  Review of Systems  Constitutional: Positive for fatigue. Negative for activity change, appetite change, chills, diaphoresis, fever and unexpected weight change.  Respiratory: Negative for cough, chest tightness, shortness of breath, wheezing and stridor.   Cardiovascular: Negative for chest pain, palpitations and leg swelling.  Gastrointestinal: Negative for abdominal distention, abdominal pain, blood in stool, constipation, diarrhea and nausea.  Musculoskeletal: Positive for myalgias.  Skin: Positive for color change and wound. Negative for pallor and rash.       Objective:   Physical Exam Vitals signs and nursing note reviewed.  Constitutional:      General: She is not in acute distress.    Appearance: She is not ill-appearing, toxic-appearing or diaphoretic.  Musculoskeletal: Normal range of motion.        General: No tenderness.      Right upper leg: She exhibits swelling and edema.     Comments: RLE- Right upper thigh, patient with an 6 cm-sized indurated area of skin with hyper erythema and increased warmth. Wound uncovered, gauze pulled out, 1/8 inch removed and trimed. Wound re-dressed Pt tolerated well   Skin:    General: Skin is warm and dry.     Findings: Erythema present.  Neurological:     Mental Status: She is alert.  Psychiatric:        Mood and Affect: Mood normal.        Behavior: Behavior normal.        Thought Content: Thought content normal.        Judgment: Judgment normal.           Assessment &  Plan:   1. Cellulitis and abscess of leg     Cellulitis and abscess of leg Your wound is healing just great! Continue to pull out 1/8 inch of guaze every 2-3 days and trim. Re-dress with non-stick bandage and cover. Complete course of Doxycycline.    FOLLOW-UP:  Return if symptoms worsen or fail to improve.

## 2018-09-22 ENCOUNTER — Ambulatory Visit: Payer: BLUE CROSS/BLUE SHIELD | Admitting: Adult Health

## 2018-09-22 ENCOUNTER — Encounter: Payer: Self-pay | Admitting: Adult Health

## 2018-09-22 DIAGNOSIS — L03119 Cellulitis of unspecified part of limb: Principal | ICD-10-CM

## 2018-09-22 DIAGNOSIS — L02419 Cutaneous abscess of limb, unspecified: Secondary | ICD-10-CM

## 2018-09-22 NOTE — Patient Instructions (Signed)
Wound Care, Adult Taking care of your wound properly can help to prevent pain, infection, and scarring. It can also help your wound to heal more quickly. How to care for your wound Wound care      Follow instructions from your health care provider about how to take care of your wound. Make sure you: ? Wash your hands with soap and water before you change the bandage (dressing). If soap and water are not available, use hand sanitizer. ? Change your dressing as told by your health care provider. ? Leave stitches (sutures), skin glue, or adhesive strips in place. These skin closures may need to stay in place for 2 weeks or longer. If adhesive strip edges start to loosen and curl up, you may trim the loose edges. Do not remove adhesive strips completely unless your health care provider tells you to do that.  Check your wound area every day for signs of infection. Check for: ? Redness, swelling, or pain. ? Fluid or blood. ? Warmth. ? Pus or a bad smell.  Ask your health care provider if you should clean the wound with mild soap and water. Doing this may include: ? Using a clean towel to pat the wound dry after cleaning it. Do not rub or scrub the wound. ? Applying a cream or ointment. Do this only as told by your health care provider. ? Covering the incision with a clean dressing.  Ask your health care provider when you can leave the wound uncovered.  Keep the dressing dry until your health care provider says it can be removed. Do not take baths, swim, use a hot tub, or do anything that would put the wound underwater until your health care provider approves. Ask your health care provider if you can take showers. You may only be allowed to take sponge baths. Medicines   If you were prescribed an antibiotic medicine, cream, or ointment, take or use the antibiotic as told by your health care provider. Do not stop taking or using the antibiotic even if your condition improves.  Take  over-the-counter and prescription medicines only as told by your health care provider. If you were prescribed pain medicine, take it 30 or more minutes before you do any wound care or as told by your health care provider. General instructions  Return to your normal activities as told by your health care provider. Ask your health care provider what activities are safe.  Do not scratch or pick at the wound.  Do not use any products that contain nicotine or tobacco, such as cigarettes and e-cigarettes. These may delay wound healing. If you need help quitting, ask your health care provider.  Keep all follow-up visits as told by your health care provider. This is important.  Eat a diet that includes protein, vitamin A, vitamin C, and other nutrient-rich foods to help the wound heal. ? Foods rich in protein include meat, dairy, beans, nuts, and other sources. ? Foods rich in vitamin A include carrots and dark green, leafy vegetables. ? Foods rich in vitamin C include citrus, tomatoes, and other fruits and vegetables. ? Nutrient-rich foods have protein, carbohydrates, fat, vitamins, or minerals. Eat a variety of healthy foods including vegetables, fruits, and whole grains. Contact a health care provider if:  You received a tetanus shot and you have swelling, severe pain, redness, or bleeding at the injection site.  Your pain is not controlled with medicine.  You have redness, swelling, or pain around the wound.  You have fluid or blood coming from the wound.  Your wound feels warm to the touch.  You have pus or a bad smell coming from the wound.  You have a fever or chills.  You are nauseous or you vomit.  You are dizzy. Get help right away if:  You have a red streak going away from your wound.  The edges of the wound open up and separate.  Your wound is bleeding, and the bleeding does not stop with gentle pressure.  You have a rash.  You faint.  You have trouble  breathing. Summary  Always wash your hands with soap and water before changing your bandage (dressing).  To help with healing, eat foods that are rich in protein, vitamin A, vitamin C, and other nutrients.  Check your wound every day for signs of infection. Contact your health care provider if you suspect that your wound is infected. This information is not intended to replace advice given to you by your health care provider. Make sure you discuss any questions you have with your health care provider. Document Released: 04/27/2008 Document Revised: 08/30/2017 Document Reviewed: 02/03/2016 Elsevier Interactive Patient Education  2019 [place] wound is healing just great! Continue to pull out 1/8 inch of guaze every 2-3 days and trim. Re-dress with non-stick bandage and cover. Complete course of Doxycycline. Please call with any questions/concerns.

## 2018-09-22 NOTE — Assessment & Plan Note (Signed)
Your wound is healing just great! Continue to pull out 1/8 inch of guaze every 2-3 days and trim. Re-dress with non-stick bandage and cover. Complete course of Doxycycline.

## 2018-10-09 NOTE — Progress Notes (Signed)
Subjective:    Patient ID: Meghan Wilcox, female    DOB: 1975-10-03, 43 y.o.   MRN: [id number]  HPI:05/09/18 OV:  [name] is here to establish as a new pt.  She is a pleasant 43 year old female. PMH: Pre-diabetes, Fibromyalgia's, Anxiety, depression, obesity, insomnia, tobacco use, and "fatty liver". She stopped taking all of her maintenance medications 3 months ago, "b/c I wanted to see how I felt without all the drugs". She estimates to drink 20 oz plain water/day and 2-3 "cans of coke/day". She eats diet high in saturated fat and CHO She denies regular exercise and smokes pack/day- declined smoking cessation. She feels that her overall health is "fair" and is ready to make a "real lifestyle change" She has worked with Nutritionists in the past and is not interested in going back due to "strong food aversions". She seldom drinks ETOH and report strong support system of family and friends  06/12/18 OV: [name] is here for CPE She continues to use tobacco, currently smoking pack/day She plans on re-starting regular waling regime and finding another zumba class to join She needs referral to different local GI, repeat colonoscopy due 2020 and current hiatal hernia is causing pain and dyspepsia She is willing to try Nicoderm patch She reports 60 lb wt loss 2014 from regular exercise and phentermine  09/12/2018 OV: [name] is here for f/u: medical weight loss She would like to re-start phentermine, she has tolerated well in past Current wt 240, goal 180 She has downloaded the "7 min workout App"- will start today She plans on starting the Nicoderm patch this week- still smoking <pack/day She estimates to drink only 20 oz plain water/day, prefers to hydrate with sweet tea or coke She underwent Colonoscopy yesterday: 3-4 polyps removed, awaiting path report  10/11/2018 OV: [name] is here for 4 week f/u: Medical Weight Loss She reports medication compliance on daily phentermine,  however reports frequently forgetting to take in the morning and will often take it after lunch and on those days will experience insomnia She denies CP/dyspnea/dizziness/palpiations She has reduced meal portion size by 50% and eliminated soda intake She has increase water intake, estimates to drink >60 oz/day She was 243 when she started Rx, current wt 235, 8 lb wt lose She has been using Lose It App She plans on starting a regular walking regime: 30-60 mins 2-3 times week this Spring Goal wt 180 She continues to taper off tobacco use, 1/2 pack/day   Patient Care Team    Relationship Specialty Notifications Start End  Mina Marble D, NP PCP - General Family Medicine  05/09/18   Marton Redwood, [place] Physician Internal Medicine  05/09/18   Associates, [facility]    06/12/18     Patient Active Problem List   Diagnosis Date Noted  . Cellulitis and abscess of leg 09/22/2018  . Smoker 09/19/2018  . Hiatal hernia 06/12/2018  . Healthcare maintenance 05/09/2018  . BMI 40.0-44.9, adult ([place]) 05/09/2018  . Plantar fasciitis of right foot 02/11/2015  . 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 Diarrhea 08/22/2014  . Colitis, acute 08/21/2014  . Leukocytosis 08/21/2014  . Chest pain 03/31/2014  . Ventral hernia 11/12/2013  . SUI (stress urinary incontinence, female) 10/01/2011  . Pelvic pain in female 10/01/2011     Past Medical History:  Diagnosis Date  . Allergy    seasonal  . Anxiety   .  Blood transfusion age 27yr   Antibody scewwn neg. 08/20/10 prior to cholecystectomy  . Depression   . Diverticulitis   . Esophagitis   . Family history of malignant neoplasm of gastrointestinal tract   . Fatty liver   . Gastroparesis   . GERD (gastroesophageal reflux disease)   . Hepatomegaly   . Hiatal hernia   . History of ovarian cyst   . Scoliosis   . Seizures ([place]    post part  eclampsia  89[place]      Past Surgical History:  Procedure Laterality Date  . BLADDER SUSPENSION  10/01/2011   Procedure: TRANSVAGINAL TAPE (TVT) PROCEDURE;  Surgeon: ToClarene DukeMD;  [facility];  Service: Gynecology;  Laterality: N/A;  Solyx Sling  . CHOLECYSTECTOMY  jan 2012   Dr AmRonnald Collum. CYSTOSCOPY  10/01/2011   Procedure: CYSTOSCOPY;  Surgeon: ToClarene DukeMD;  [facility];  Service: Gynecology;  Laterality: N/A;  . INNER EAR SURGERY Left   . KNEE SURGERY Left   . LAPAROSCOPY  10/01/2011   Procedure: LAPAROSCOPY OPERATIVE;  Surgeon: ToClarene DukeMD;  [facility];  Service: Gynecology;  Laterality: N/A;  removal of right fimbria, fulgeration of endometriosis  . Steel rod in her back  age 43 scoliosis  . TONSILLECTOMY    . TUBAL LIGATION    . uterine ablation    . WISDOM TOOTH EXTRACTION       Family History  Problem Relation Age of Onset  . Diabetes Mother   . Cirrhosis Mother   . Irritable bowel syndrome Mother   . Crohn's disease Mother   . Lung cancer Mother   . Heart disease Mother   . Diabetes Father   . Heart disease Father   . Liver disease Father   . Cirrhosis Father   . Diabetes Brother   . Colon cancer Paternal Grandfather 7024. Colon cancer Other   . Rectal cancer Other   . Esophageal cancer Neg Hx   . Stomach cancer Neg Hx      Social History   Substance and Sexual Activity  Drug Use No     Social History   Substance and Sexual Activity  Alcohol Use Yes  . Alcohol/week: 0.0 standard drinks   Comment: occasional     Social History   Tobacco Use  Smoking Status Current Every Day Smoker  . Packs/day: 1.00  . Years: 30.00  . Pack years: 30.00  . Types: Cigarettes  Smokeless Tobacco Never Used     Outpatient Encounter Medications as of 10/11/2018  Medication Sig  . phentermine 37.5 MG capsule Take 1 capsule (37.5 mg total) by mouth every morning.  . [DISCONTINUED] phentermine 37.5 MG capsule Take 1 capsule (37.5 mg  total) by mouth every morning.  . nicotine (NICODERM CQ - DOSED IN MG/24 HOURS) 14 mg/24hr patch Place 1 patch (14 mg total) onto the skin daily. (Patient not taking: Reported on 10/11/2018)   No facility-administered encounter medications on file as of 10/11/2018.     Allergies: Patient has no known allergies.  Body mass index is 41.7 kg/m.  Blood pressure 115/81, pulse 91, temperature 98.2 F (36.8 C), temperature source Oral, height 5' 3"  (1.6 m), weight 235 lb 6.4 oz (106.8 kg), SpO2 96 %.  Review of Systems  Constitutional: Positive for fatigue. Negative for activity change, appetite change, chills, diaphoresis, fever and unexpected weight change.  Respiratory: Negative for cough, chest tightness,  shortness of breath, wheezing and stridor.   Cardiovascular: Negative for chest pain, palpitations and leg swelling.  Gastrointestinal: Negative for abdominal distention, abdominal pain, blood in stool, constipation, diarrhea, nausea and vomiting.  Musculoskeletal: Positive for arthralgias and myalgias.  Neurological: Negative for dizziness and headaches.  Hematological: Does not bruise/bleed easily.  Psychiatric/Behavioral: Negative for behavioral problems, confusion, decreased concentration, dysphoric mood, hallucinations, self-injury, sleep disturbance and suicidal ideas. The patient is not nervous/anxious and is not hyperactive.        Objective:   Physical Exam Vitals signs and nursing note reviewed.  Constitutional:      General: She is not in acute distress.    Appearance: She is well-developed. She is not diaphoretic.  HENT:     Head: Normocephalic and atraumatic.  Eyes:     Extraocular Movements: Extraocular movements intact.     Conjunctiva/sclera: Conjunctivae normal.     Pupils: Pupils are equal, round, and reactive to light.  Neck:     Musculoskeletal: Normal range of motion and neck supple.  Cardiovascular:     Rate and Rhythm: Normal rate and regular rhythm.      Heart sounds: Normal heart sounds. No murmur.  Pulmonary:     Effort: Pulmonary effort is normal. No respiratory distress.     Breath sounds: Normal breath sounds. No stridor. No wheezing or rales.  Chest:     Chest wall: No tenderness.  Lymphadenopathy:     Cervical: No cervical adenopathy.  Skin:    General: Skin is warm and dry.     Capillary Refill: Capillary refill takes less than 2 seconds.     Coloration: Skin is not pale.     Findings: No erythema or rash.  Neurological:     Mental Status: She is alert and oriented to person, place, and time.  Psychiatric:        Behavior: Behavior normal.        Thought Content: Thought content normal.        Judgment: Judgment normal.        Assessment & Plan:   1. BMI 40.0-44.9, adult ([facility])     BMI 40.0-44.9, adult ([place]) She was 243 when she started Rx, current wt 235, 8 lb wt lose She has been using Lose It App She plans on starting a regular walking regime: 30-60 mins 2-3 times week this Spring Goal wt [place] Controlled Substance Database reviewed- no aberrancies noted Has been on one month of phentermine Second month provided today, okay to refill one more time Then will need one month medication vacation Will need OV to re-start for final three month course    FOLLOW-UP:  Return in about 3 months (around 01/11/2019) for Medical Weight Loss.

## 2018-10-11 ENCOUNTER — Other Ambulatory Visit: Payer: Self-pay

## 2018-10-11 ENCOUNTER — Encounter: Payer: Self-pay | Admitting: Adult Health

## 2018-10-11 ENCOUNTER — Ambulatory Visit (INDEPENDENT_AMBULATORY_CARE_PROVIDER_SITE_OTHER): Payer: BLUE CROSS/BLUE SHIELD | Admitting: Adult Health

## 2018-10-11 VITALS — BP 115/81 | HR 91 | Temp 98.2°F | Ht 63.0 in | Wt 235.4 lb

## 2018-10-11 DIAGNOSIS — Z6841 Body Mass Index (BMI) 40.0 and over, adult: Secondary | ICD-10-CM

## 2018-10-11 DIAGNOSIS — Z719 Counseling, unspecified: Secondary | ICD-10-CM

## 2018-10-11 MED ORDER — PHENTERMINE HCL 37.5 MG PO CAPS: 37.5000 mg | ORAL_CAPSULE | ORAL | 0 refills | Status: DC

## 2018-10-11 NOTE — Assessment & Plan Note (Signed)
She was 243 when she started Rx, current wt 235, 8 lb wt lose She has been using Lose It App She plans on starting a regular walking regime: 30-60 mins 2-3 times week this Spring Goal wt [place] reviewed- no aberrancies noted Has been on one month of phentermine Second month provided today, okay to refill one more time Then will need one month medication vacation Will need OV to re-start for final three month course

## 2018-10-11 NOTE — Patient Instructions (Signed)
Exercising to Lose Weight Exercise is structured, repetitive physical activity to improve fitness and health. Getting regular exercise is important for everyone. It is especially important if you are overweight. Being overweight increases your risk of heart disease, stroke, diabetes, high blood pressure, and several types of cancer. Reducing your calorie intake and exercising can help you lose weight. Exercise is usually categorized as moderate or vigorous intensity. To lose weight, most people need to do a certain amount of moderate-intensity or vigorous-intensity exercise each week. Moderate-intensity exercise  Moderate-intensity exercise is any activity that gets you moving enough to burn at least three times more energy (calories) than if you were sitting. Examples of moderate exercise include:  Walking a mile in 15 minutes.  Doing light yard work.  Biking at an easy pace. Most people should get at least 150 minutes (2 hours and 30 minutes) a week of moderate-intensity exercise to maintain their body weight. Vigorous-intensity exercise Vigorous-intensity exercise is any activity that gets you moving enough to burn at least six times more calories than if you were sitting. When you exercise at this intensity, you should be working hard enough that you are not able to carry on a conversation. Examples of vigorous exercise include:  Running.  Playing a team sport, such as football, basketball, and soccer.  Jumping rope. Most people should get at least 75 minutes (1 hour and 15 minutes) a week of vigorous-intensity exercise to maintain their body weight. How can exercise affect me? When you exercise enough to burn more calories than you eat, you lose weight. Exercise also reduces body fat and builds muscle. The more muscle you have, the more calories you burn. Exercise also:  Improves mood.  Reduces stress and tension.  Improves your overall fitness, flexibility, and endurance.   Increases bone strength. The amount of exercise you need to lose weight depends on:  Your age.  The type of exercise.  Any health conditions you have.  Your overall physical ability. Talk to your health care provider about how much exercise you need and what types of activities are safe for you. What actions can I take to lose weight? Nutrition   Make changes to your diet as told by your health care provider or diet and nutrition specialist (dietitian). This may include: ? Eating fewer calories. ? Eating more protein. ? Eating less unhealthy fats. ? Eating a diet that includes fresh fruits and vegetables, whole grains, low-fat dairy products, and lean protein. ? Avoiding foods with added fat, salt, and sugar.  Drink plenty of water while you exercise to prevent dehydration or heat stroke. Activity  Choose an activity that you enjoy and set realistic goals. Your health care provider can help you make an exercise plan that works for you.  Exercise at a moderate or vigorous intensity most days of the week. ? The intensity of exercise may vary from person to person. You can tell how intense a workout is for you by paying attention to your breathing and heartbeat. Most people will notice their breathing and heartbeat get faster with more intense exercise.  Do resistance training twice each week, such as: ? Push-ups. ? Sit-ups. ? Lifting weights. ? Using resistance bands.  Getting short amounts of exercise can be just as helpful as long structured periods of exercise. If you have trouble finding time to exercise, try to include exercise in your daily routine. ? Get up, stretch, and walk around every 30 minutes throughout the day. ? Go for a  walk during your lunch break. ? Park your car farther away from your destination. ? If you take public transportation, get off one stop early and walk the rest of the way. ? Make phone calls while standing up and walking around. ? Take the  stairs instead of elevators or escalators.  Wear comfortable clothes and shoes with good support.  Do not exercise so much that you hurt yourself, feel dizzy, or get very short of breath. Where to find more information  U.S. Department of Health and Human Services: Bonded[employer].at  Centers for Disease Control and Prevention (CDC): http://www.wolf.info/ Contact a health care provider:  Before starting a new exercise program.  If you have questions or concerns about your weight.  If you have a medical problem that keeps you from exercising. Get help right away if you have any of the following while exercising:  Injury.  Dizziness.  Difficulty breathing or shortness of breath that does not go away when you stop exercising.  Chest pain.  Rapid heartbeat. Summary  Being overweight increases your risk of heart disease, stroke, diabetes, high blood pressure, and several types of cancer.  Losing weight happens when you burn more calories than you eat.  Reducing the amount of calories you eat in addition to getting regular moderate or vigorous exercise each week helps you lose weight. This information is not intended to replace advice given to you by your health care provider. Make sure you discuss any questions you have with your health care provider. Document Released: 08/21/2010 Document Revised: 08/01/2017 Document Reviewed: 08/01/2017 Elsevier Interactive Patient Education  2019 [place] on your blood pressure and weight loss! Continue using Lose It App Start regular walking regime: 30-60 mins 2-3 times week. Second month provided today, okay to refill one more time Then will need one month medication vacation Will need office visit to re-start for final three month. GREAT TO SEE YOU!

## 2019-01-16 ENCOUNTER — Other Ambulatory Visit: Payer: Self-pay

## 2019-01-16 ENCOUNTER — Ambulatory Visit (INDEPENDENT_AMBULATORY_CARE_PROVIDER_SITE_OTHER): Payer: BC Managed Care – PPO | Admitting: Adult Health

## 2019-01-16 ENCOUNTER — Encounter: Payer: Self-pay | Admitting: Adult Health

## 2019-01-16 DIAGNOSIS — F411 Generalized anxiety disorder: Secondary | ICD-10-CM | POA: Insufficient documentation

## 2019-01-16 DIAGNOSIS — Z6841 Body Mass Index (BMI) 40.0 and over, adult: Secondary | ICD-10-CM | POA: Diagnosis not present

## 2019-01-16 DIAGNOSIS — G4700 Insomnia, unspecified: Secondary | ICD-10-CM | POA: Insufficient documentation

## 2019-01-16 MED ORDER — ZOLPIDEM TARTRATE 10 MG PO TABS: 10.0000 mg | ORAL_TABLET | Freq: Every evening | ORAL | 0 refills | Status: DC | PRN

## 2019-01-16 MED ORDER — FLUOXETINE HCL 20 MG PO TABS: ORAL_TABLET | ORAL | 0 refills | Status: DC

## 2019-01-16 NOTE — Progress Notes (Signed)
Virtual Visit via Telephone Note  I connected with Meghan Wilcox on 01/16/19 at  4:15 PM EDT by telephone and verified that I am speaking with the correct person using two identifiers.  Location: Patient: Home Provider: In Clinic   I discussed the limitations, risks, security and privacy concerns of performing an evaluation and management service by telephone and the availability of in person appointments. I also discussed with the patient that there may be a patient responsible charge related to this service. The patient expressed understanding and agreed to proceed.   History of Present Illness: 10/11/2018 OV: Meghan Wilcox is here for 4 week f/u: Medical Weight Loss She reports medication compliance on daily phentermine, however reports frequently forgetting to take in the morning and will often take it after lunch and on those days will experience insomnia She denies CP/dyspnea/dizziness/palpiations She has reduced meal portion size by 50% and eliminated soda intake She has increase water intake, estimates to drink >60 oz/day She was 243 when she started Rx, current wt 235, 8 lb wt lose She has been using Lose It App She plans on starting a regular walking regime: 30-60 mins 2-3 times week this Spring Goal wt 180 She continues to taper off tobacco use, 1/2 pack/day  01/16/2019 OV: Meghan Wilcox is calling in today for f/u on medical wt loss, She completed first 3 month course of phentermine Starting wt 243 Current wt 234 She took medication vacation consistently for one month, then due to inconsistent schedule due to COVID-19 She has not been exercising, however reports reduced appetite due to stress.  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 Her cousin is unable to perform any care for herself.  She denies SI/HI and reports excellent support system of local friends. She declined referral to [facility] at this time She reports being on Sertraline in the past, denies SE She also reports difficulty falling and remaining asleep and the daytime fatigue is causing irritability. She has been on Zolpidem 23m QD- denies SE with prior use  Patient Care Team    Relationship Specialty Notifications Start End  DMina MarbleD, NP PCP - General Family Medicine  05/09/18   SMarton Redwood MD Consulting Physician Internal Medicine  05/09/18   [facility]    06/12/18     Patient Active Problem List   Diagnosis Date Noted  . Cellulitis and abscess of leg 09/22/2018  . Smoker 09/19/2018  . Hiatal hernia 06/12/2018  . Healthcare maintenance 05/09/2018  . BMI 40.0-44.9, adult ([facility] 05/09/2018  . Plantar fasciitis of right foot 02/11/2015  . 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 Diarrhea 08/22/2014  . Colitis, acute 08/21/2014  . Leukocytosis 08/21/2014  . Chest pain 03/31/2014  . Ventral hernia 11/12/2013  . SUI (stress urinary incontinence, female) 10/01/2011  . Pelvic pain in female  10/01/2011     Past Medical History:  Diagnosis Date  . Allergy    seasonal  . Anxiety   . Blood transfusion age 89yr   Antibody scewwn neg. 08/20/10 prior to cholecystectomy  . Depression   . Diverticulitis   . Esophagitis   . Family history of malignant neoplasm of gastrointestinal tract   . Fatty liver   . Gastroparesis   . GERD  (gastroesophageal reflux disease)   . Hepatomegaly   . Hiatal hernia   . History of ovarian cyst   . Scoliosis   . Seizures ([facility]    post part  eclampsia  17yrago     Past Surgical History:  Procedure Laterality Date  . BLADDER SUSPENSION  10/01/2011   Procedure: TRANSVAGINAL TAPE (TVT) PROCEDURE;  Surgeon: ToClarene DukeMD;  [facility];  Service: Gynecology;  Laterality: N/A;  Solyx Sling  . CHOLECYSTECTOMY  jan 2012   Dr AmRonnald Collum. CYSTOSCOPY  10/01/2011   Procedure: CYSTOSCOPY;  Surgeon: ToClarene DukeMD;  [facility];  Service: Gynecology;  Laterality: N/A;  . INNER EAR SURGERY Left   . KNEE SURGERY Left   . LAPAROSCOPY  10/01/2011   Procedure: LAPAROSCOPY OPERATIVE;  Surgeon: ToClarene DukeMD;  [facility];  Service: Gynecology;  Laterality: N/A;  removal of right fimbria, fulgeration of endometriosis  . Steel rod in her back  age 43 scoliosis  . TONSILLECTOMY    . TUBAL LIGATION    . uterine ablation    . WISDOM TOOTH EXTRACTION       Family History  Problem Relation Age of Onset  . Diabetes Mother   . Cirrhosis Mother   . Irritable bowel syndrome Mother   . Crohn's disease Mother   . Lung cancer Mother   . Heart disease Mother   . Diabetes Father   . Heart disease Father   . Liver disease Father   . Cirrhosis Father   . Diabetes Brother   . Colon cancer Paternal Grandfather [id number]. Colon cancer Other   . Rectal cancer Other   . Esophageal cancer Neg Hx   . Stomach cancer Neg Hx      Social History   Substance and Sexual Activity  Drug Use No     Social History   Substance and Sexual Activity  Alcohol Use Yes  . Alcohol/week: 0.0 standard drinks   Comment: occasional     Social History   Tobacco Use  Smoking Status Current Every Day Smoker  . Packs/day: 1.00  . Years: 30.00  . Pack years: 30.00  . Types: Cigarettes  Smokeless Tobacco Never Used     Outpatient Encounter Medications as of 01/16/2019   Medication Sig  . [DISCONTINUED] nicotine (NICODERM CQ - DOSED IN MG/24 HOURS) 14 mg/24hr patch Place 1 patch (14 mg total) onto the skin daily. (Patient not taking: Reported on 10/11/2018)  . [DISCONTINUED] phentermine 37.5 MG capsule Take 1 capsule (37.5 mg total) by mouth every morning.   No facility-administered encounter medications on file as of 01/16/2019.     Allergies: Patient has no known allergies.  Body mass index is 41.59 kg/m.  Temperature 97.7 F (36.5 C), temperature source Temporal, height 5' 3"  (1.6 m), weight 234 lb 12.8 oz (106.5 kg). Review of Systems: General:   Denies fever, chills, unexplained weight loss.  Optho/Auditory:   Denies visual changes, blurred vision/LOV Respiratory:   Denies SOB, DOE more  than baseline levels.  Cardiovascular:   Denies chest pain, palpitations, new onset peripheral edema  Gastrointestinal:   Denies nausea, vomiting, diarrhea.  Genitourinary: Denies dysuria, freq/ urgency, flank pain or discharge from genitals.  Endocrine:  Denies hot or cold intolerance, polyuria, polydipsia. Musculoskeletal:   Denies unexplained myalgias, joint swelling, unexplained arthralgias, gait problems.  Skin:  Denies rash, suspicious lesions Neurological:     Denies dizziness, unexplained weakness, numbness  Psychiatric/Behavioral:  Sig increase in stress/anxiety +, Denies suicidal or homicidal ideations, hallucinations This patient does not have sx concerning for COVID-19 Infection (ie; fever, chills, cough, new or worsening shortness of breath).  Observations/Objective: No acute distress noted during telephone conversation  Assessment and Plan: Will hold off on phentermine until mood and sleep pattern has stabilized. Please start Fluoxetine 46m- 1/2 tab QD for one week, then increase to full tab Increase regular [place] no aberrancies noted Zolpidem 174mQD, advised to avoid ETOH when  using  Follow Up Instructions: TeleMedicine visit in 3-4 weeks, sooner if needed   I discussed the assessment and treatment plan with the patient. The patient was provided an opportunity to ask questions and all were answered. The patient agreed with the plan and demonstrated an understanding of the instructions.   The patient was advised to call back or seek an in-person evaluation if the symptoms worsen or if the condition fails to improve as anticipated.  I provided 25 minutes of non-face-to-face time during this encounter.   Ka[place] GrandchildNP

## 2019-01-16 NOTE — Assessment & Plan Note (Signed)
Mediterranean diet Increase regular exercise Hold off on phentermine until mood and insomnia has stabilized

## 2019-01-16 NOTE — Assessment & Plan Note (Addendum)
Assessment and Plan: Will hold off on phentermine until mood and sleep pattern has stabilized. Please start Fluoxetine 76m- 1/2 tab QD for one week, then increase to full tab Increase regular exercise [place]- no aberrancies noted Zolpidem 171mQD, advised to avoid ETOH when using

## 2019-02-07 DIAGNOSIS — Z1389 Encounter for screening for other disorder: Secondary | ICD-10-CM | POA: Diagnosis not present

## 2019-02-07 DIAGNOSIS — Z6841 Body Mass Index (BMI) 40.0 and over, adult: Secondary | ICD-10-CM | POA: Diagnosis not present

## 2019-02-07 DIAGNOSIS — Z1231 Encounter for screening mammogram for malignant neoplasm of breast: Secondary | ICD-10-CM | POA: Diagnosis not present

## 2019-02-07 DIAGNOSIS — Z01419 Encounter for gynecological examination (general) (routine) without abnormal findings: Secondary | ICD-10-CM | POA: Diagnosis not present

## 2019-02-07 DIAGNOSIS — Z130 Encounter for screening for diseases of the blood and blood-forming organs and certain disorders involving the immune mechanism: Secondary | ICD-10-CM | POA: Diagnosis not present

## 2019-02-09 ENCOUNTER — Other Ambulatory Visit: Payer: Self-pay | Admitting: Adult Health

## 2019-02-12 ENCOUNTER — Other Ambulatory Visit: Payer: Self-pay | Admitting: Obstetrics and Gynecology

## 2019-02-12 DIAGNOSIS — R928 Other abnormal and inconclusive findings on diagnostic imaging of breast: Secondary | ICD-10-CM

## 2019-02-15 ENCOUNTER — Ambulatory Visit
Admission: RE | Admit: 2019-02-15 | Discharge: 2019-02-15 | Disposition: A | Discharge status: Home / Self Care | Payer: BC Managed Care – PPO | Source: Ambulatory Visit | Attending: Obstetrics and Gynecology | Admitting: Obstetrics and Gynecology

## 2019-02-15 DIAGNOSIS — R928 Other abnormal and inconclusive findings on diagnostic imaging of breast: Secondary | ICD-10-CM

## 2019-02-15 DIAGNOSIS — N6002 Solitary cyst of left breast: Secondary | ICD-10-CM | POA: Diagnosis not present

## 2019-03-10 ENCOUNTER — Other Ambulatory Visit: Payer: Self-pay | Admitting: Adult Health

## 2019-03-12 ENCOUNTER — Telehealth: Payer: Self-pay | Admitting: Adult Health

## 2019-03-12 ENCOUNTER — Other Ambulatory Visit: Payer: Self-pay

## 2019-03-12 ENCOUNTER — Telehealth: Payer: Self-pay

## 2019-03-12 MED ORDER — FLUOXETINE HCL 20 MG PO CAPS: 20.0000 mg | ORAL_CAPSULE | Freq: Every day | ORAL | 0 refills | Status: DC

## 2019-03-12 NOTE — Telephone Encounter (Signed)
Please call pt to schedule telemedicine f/u for fluoxetine use.  No further refills until pt has this appointment.  Charyl Bigger, CMA

## 2019-03-12 NOTE — Telephone Encounter (Signed)
Received fax from [place] requesting prior authorization for Fluoxetine tablets.  However, insurance will cover capsule formulation so refill sent to pharmacy for capsules.  Charyl Bigger, CMA

## 2019-03-12 NOTE — Telephone Encounter (Signed)
Rcvd msg frm Tonya/ CMA to contact pt regarding required  Appt/ Telehealth for Rx refill.---Left patient message to call office .  --FYI to medical assistant.  --Dion Body

## 2019-03-28 DIAGNOSIS — D485 Neoplasm of uncertain behavior of skin: Secondary | ICD-10-CM | POA: Diagnosis not present

## 2019-03-28 DIAGNOSIS — L820 Inflamed seborrheic keratosis: Secondary | ICD-10-CM | POA: Diagnosis not present

## 2019-03-28 DIAGNOSIS — L814 Other melanin hyperpigmentation: Secondary | ICD-10-CM | POA: Diagnosis not present

## 2019-03-28 DIAGNOSIS — D1801 Hemangioma of skin and subcutaneous tissue: Secondary | ICD-10-CM | POA: Diagnosis not present

## 2019-03-28 DIAGNOSIS — B079 Viral wart, unspecified: Secondary | ICD-10-CM | POA: Diagnosis not present

## 2019-03-28 DIAGNOSIS — L738 Other specified follicular disorders: Secondary | ICD-10-CM | POA: Diagnosis not present

## 2019-03-28 DIAGNOSIS — D225 Melanocytic nevi of trunk: Secondary | ICD-10-CM | POA: Diagnosis not present

## 2019-04-16 ENCOUNTER — Other Ambulatory Visit: Payer: Self-pay | Admitting: Adult Health

## 2019-05-06 ENCOUNTER — Other Ambulatory Visit: Payer: Self-pay | Admitting: Adult Health

## 2019-07-17 DIAGNOSIS — L0291 Cutaneous abscess, unspecified: Secondary | ICD-10-CM | POA: Diagnosis not present

## 2020-05-23 DIAGNOSIS — D225 Melanocytic nevi of trunk: Secondary | ICD-10-CM | POA: Diagnosis not present

## 2020-05-23 DIAGNOSIS — L814 Other melanin hyperpigmentation: Secondary | ICD-10-CM | POA: Diagnosis not present

## 2020-05-23 DIAGNOSIS — Z85828 Personal history of other malignant neoplasm of skin: Secondary | ICD-10-CM | POA: Diagnosis not present

## 2020-05-23 DIAGNOSIS — L905 Scar conditions and fibrosis of skin: Secondary | ICD-10-CM | POA: Diagnosis not present

## 2020-07-21 DIAGNOSIS — E559 Vitamin D deficiency, unspecified: Secondary | ICD-10-CM | POA: Diagnosis not present

## 2020-07-21 DIAGNOSIS — E668 Other obesity: Secondary | ICD-10-CM | POA: Diagnosis not present

## 2020-07-21 DIAGNOSIS — Z13220 Encounter for screening for lipoid disorders: Secondary | ICD-10-CM | POA: Diagnosis not present

## 2020-07-21 DIAGNOSIS — E669 Obesity, unspecified: Secondary | ICD-10-CM | POA: Diagnosis not present

## 2020-07-21 DIAGNOSIS — R7989 Other specified abnormal findings of blood chemistry: Secondary | ICD-10-CM | POA: Diagnosis not present

## 2020-07-21 DIAGNOSIS — M419 Scoliosis, unspecified: Secondary | ICD-10-CM | POA: Diagnosis not present

## 2020-07-21 DIAGNOSIS — R946 Abnormal results of thyroid function studies: Secondary | ICD-10-CM | POA: Diagnosis not present

## 2020-08-05 DIAGNOSIS — D1801 Hemangioma of skin and subcutaneous tissue: Secondary | ICD-10-CM | POA: Diagnosis not present

## 2020-08-05 DIAGNOSIS — D485 Neoplasm of uncertain behavior of skin: Secondary | ICD-10-CM | POA: Diagnosis not present

## 2020-08-13 DIAGNOSIS — K7689 Other specified diseases of liver: Secondary | ICD-10-CM | POA: Diagnosis not present

## 2020-10-15 DIAGNOSIS — Z01419 Encounter for gynecological examination (general) (routine) without abnormal findings: Secondary | ICD-10-CM | POA: Diagnosis not present

## 2020-10-15 DIAGNOSIS — Z124 Encounter for screening for malignant neoplasm of cervix: Secondary | ICD-10-CM | POA: Diagnosis not present

## 2020-10-15 DIAGNOSIS — Z1231 Encounter for screening mammogram for malignant neoplasm of breast: Secondary | ICD-10-CM | POA: Diagnosis not present

## 2020-10-15 DIAGNOSIS — Z1151 Encounter for screening for human papillomavirus (HPV): Secondary | ICD-10-CM | POA: Diagnosis not present

## 2020-10-15 DIAGNOSIS — Z130 Encounter for screening for diseases of the blood and blood-forming organs and certain disorders involving the immune mechanism: Secondary | ICD-10-CM | POA: Diagnosis not present

## 2020-10-16 DIAGNOSIS — R82998 Other abnormal findings in urine: Secondary | ICD-10-CM | POA: Diagnosis not present

## 2020-10-16 DIAGNOSIS — Z124 Encounter for screening for malignant neoplasm of cervix: Secondary | ICD-10-CM | POA: Diagnosis not present

## 2020-10-16 DIAGNOSIS — Z1151 Encounter for screening for human papillomavirus (HPV): Secondary | ICD-10-CM | POA: Diagnosis not present

## 2020-10-22 LAB — HM PAP SMEAR

## 2020-11-05 DIAGNOSIS — Z01812 Encounter for preprocedural laboratory examination: Secondary | ICD-10-CM | POA: Diagnosis not present

## 2020-11-12 ENCOUNTER — Encounter (HOSPITAL_BASED_OUTPATIENT_CLINIC_OR_DEPARTMENT_OTHER): Payer: Self-pay | Admitting: Obstetrics and Gynecology

## 2020-11-12 ENCOUNTER — Other Ambulatory Visit: Payer: Self-pay

## 2020-11-12 NOTE — Progress Notes (Signed)
Spoke w/ via phone for pre-op interview---pt Lab needs dos----   Cbc urine preg            Lab results------ett 04-01-2014 normal epic COVID test ------11-19-2020 800 Arrive at -------600 am 11-21-2020 NPO after MN NO Solid Food.  Clear liquids from MN until---500 am then npo Med rec completed Medications to take morning of surgery -----none  Diabetic medication -----n/a Patient instructed to bring photo id and insurance card day of surgery Patient aware to have Driver (ride ) / caregiver   Spouse bryan will drop pt off  for 24 hours after surgery  Patient Special Instructions -----none Pre-Op special Istructions -----none Patient verbalized understanding of instructions that were given at this phone interview. Patient denies shortness of breath, chest pain, fever, cough at this phone interview.

## 2020-11-19 ENCOUNTER — Other Ambulatory Visit (HOSPITAL_COMMUNITY)
Admission: RE | Admit: 2020-11-19 | Discharge: 2020-11-19 | Disposition: A | Discharge status: Home / Self Care | Payer: BC Managed Care – PPO | Source: Ambulatory Visit | Attending: Obstetrics and Gynecology | Admitting: Obstetrics and Gynecology

## 2020-11-19 DIAGNOSIS — Z800 Family history of malignant neoplasm of digestive organs: Secondary | ICD-10-CM | POA: Diagnosis not present

## 2020-11-19 DIAGNOSIS — Z8379 Family history of other diseases of the digestive system: Secondary | ICD-10-CM | POA: Diagnosis not present

## 2020-11-19 DIAGNOSIS — N9412 Deep dyspareunia: Secondary | ICD-10-CM | POA: Diagnosis not present

## 2020-11-19 DIAGNOSIS — Z87891 Personal history of nicotine dependence: Secondary | ICD-10-CM | POA: Diagnosis not present

## 2020-11-19 DIAGNOSIS — K760 Fatty (change of) liver, not elsewhere classified: Secondary | ICD-10-CM | POA: Diagnosis not present

## 2020-11-19 DIAGNOSIS — Z01812 Encounter for preprocedural laboratory examination: Secondary | ICD-10-CM | POA: Insufficient documentation

## 2020-11-19 DIAGNOSIS — Z20822 Contact with and (suspected) exposure to covid-19: Secondary | ICD-10-CM | POA: Insufficient documentation

## 2020-11-19 DIAGNOSIS — Z9049 Acquired absence of other specified parts of digestive tract: Secondary | ICD-10-CM | POA: Diagnosis not present

## 2020-11-19 DIAGNOSIS — Z8669 Personal history of other diseases of the nervous system and sense organs: Secondary | ICD-10-CM | POA: Diagnosis not present

## 2020-11-19 DIAGNOSIS — Z801 Family history of malignant neoplasm of trachea, bronchus and lung: Secondary | ICD-10-CM | POA: Diagnosis not present

## 2020-11-19 DIAGNOSIS — Z85820 Personal history of malignant melanoma of skin: Secondary | ICD-10-CM | POA: Diagnosis not present

## 2020-11-19 DIAGNOSIS — N889 Noninflammatory disorder of cervix uteri, unspecified: Secondary | ICD-10-CM | POA: Diagnosis not present

## 2020-11-19 DIAGNOSIS — Z8249 Family history of ischemic heart disease and other diseases of the circulatory system: Secondary | ICD-10-CM | POA: Diagnosis not present

## 2020-11-19 LAB — SARS CORONAVIRUS 2 (TAT 6-24 HRS): SARS Coronavirus 2: NEGATIVE

## 2020-11-20 NOTE — H&P (Signed)
Meghan Wilcox is an 45 y.o. female. She was seen for annual exam in March.  She has increasing pain with intercourse.  She has a cystic mass anterior cervix, manipulating this mass reproduces this pain.  Pertinent Gynecological History:  Last mammogram: normal Date: 09/2020 Last pap: normal Date: 09/2020 OB History: G5, P3023 SVD x 3   Menstrual History: No LMP recorded. Patient has had an ablation.    Past Medical History:  Diagnosis Date  . Allergy    seasonal  . Blood transfusion age 33yr   Antibody scewwn neg. 08/20/10 prior to cholecystectomy  . Cancer ([facility] [place]   2 spots melanoma removed under left breast  . Cervical mass   . Chronic back pain   . Diverticulitis   . Family history of malignant neoplasm of gastrointestinal tract   . Fatty liver   . Gastroparesis   . GERD (gastroesophageal reflux disease)   . Hepatomegaly   . History of ovarian cyst   . Scoliosis   . Seizures ([facility]    post partum  eclampsia  22 yrs ago    Past Surgical History:  Procedure Laterality Date  . BLADDER SUSPENSION  10/01/2011   Procedure: TRANSVAGINAL TAPE (TVT) PROCEDURE;  Surgeon: Meghan Duke MD;  [facility];  Service: Gynecology;  Laterality: N/A;  Solyx Sling  . CHOLECYSTECTOMY  jan 2012   Dr ARonnald Collum . CYSTOSCOPY  10/01/2011   Procedure: CYSTOSCOPY;  Surgeon: Meghan Duke MD;  [facility];  Service: Gynecology;  Laterality: N/A;  . INNER EAR SURGERY Left age [id number] . KNEE SURGERY Left age 45  arthroscopic  . LAPAROSCOPY  10/01/2011   Procedure: LAPAROSCOPY OPERATIVE;  Surgeon: Meghan Duke MD;  [facility];  Service: Gynecology;  Laterality: N/A;  removal of right fimbria, fulgeration of endometriosis  . Steel rod in her back  age 45  scoliosis  . TONSILLECTOMY  as child  . TUBAL LIGATION  april 31 2007  . uterine ablation  2008  . WISDOM TOOTH EXTRACTION      Family History  Problem Relation Age of Onset  . Diabetes Mother   . Cirrhosis  Mother   . Irritable bowel syndrome Mother   . Crohn's disease Mother   . Lung cancer Mother   . Heart disease Mother   . Diabetes Father   . Heart disease Father   . Liver disease Father   . Cirrhosis Father   . Diabetes Brother   . Colon cancer Paternal Grandfather 780 . Colon cancer Other   . Rectal cancer Other   . Esophageal cancer Neg Hx   . Stomach cancer Neg Hx     Social History:  reports that she quit smoking about 9 months ago. Her smoking use included cigarettes. She has a 30.00 pack-year smoking history. She has never used smokeless tobacco. She reports current alcohol use. She reports that she does not use drugs.  Allergies: No Known Allergies  No medications prior to admission.    Review of Systems  Respiratory: Negative.   Cardiovascular: Negative.     Height 5' 3"  (1.6 m), weight 113.4 kg. Physical Exam Constitutional:      Appearance: Normal appearance.  Cardiovascular:     Rate and Rhythm: Normal rate and regular rhythm.     Heart sounds: Normal heart sounds. No murmur heard.   Pulmonary:     Effort: Pulmonary effort is normal. No respiratory distress.  Breath sounds: Normal breath sounds. No wheezing.  Abdominal:     General: There is no distension.     Palpations: Abdomen is soft. There is no mass.     Tenderness: There is no abdominal tenderness.  Genitourinary:    Comments: Uterus normal size Cystic mass anterior cervix No adnexal mass, mild tenderness Musculoskeletal:     Cervical back: Normal range of motion and neck supple.  Neurological:     Mental Status: She is alert.     No results found for this or any previous visit (from the past 24 hour(s)).  No results found.  Assessment/Plan: Deep dyspareunia, pain is recreated by manipulating a cystic mass on her anterior cervix.  She desires removal of this mass.  Surgical procedure, risks, chances of relieving her symptoms all discussed, questions answered.  Will admit for removal  of anterior cervical mass  Meghan Wilcox 11/20/2020, 8:42 PM

## 2020-11-20 NOTE — Anesthesia Preprocedure Evaluation (Addendum)
Anesthesia Evaluation  Patient identified by MRN, date of birth, ID band Patient awake    Reviewed: Allergy & Precautions, NPO status , Patient's Chart, lab work & pertinent test results  History of Anesthesia Complications Negative for: history of anesthetic complications  Airway Mallampati: II  TM Distance: >3 FB Neck ROM: Full    Dental no notable dental hx.    Pulmonary Patient abstained from smoking., former smoker,    Pulmonary exam normal        Cardiovascular negative cardio ROS Normal cardiovascular exam     Neuro/Psych Anxiety negative neurological ROS     GI/Hepatic Neg liver ROS, hiatal hernia, GERD  Medicated and Controlled,gastroparesis   Endo/Other  Morbid obesity  Renal/GU negative Renal ROS  negative genitourinary   Musculoskeletal negative musculoskeletal ROS (+)   Abdominal   Peds  Hematology negative hematology ROS (+)   Anesthesia Other Findings Day of surgery medications reviewed with patient.  Reproductive/Obstetrics Cervical mass                            Anesthesia Physical Anesthesia Plan  ASA: III  Anesthesia Plan: General   Post-op Pain Management:    Induction: Intravenous  PONV Risk Score and Plan: 3 and Treatment may vary due to age or medical condition, Ondansetron, Dexamethasone, Midazolam and Scopolamine patch - Pre-op  Airway Management Planned: LMA  Additional Equipment: None  Intra-op Plan:   Post-operative Plan: Extubation in OR  Informed Consent: I have reviewed the patients History and Physical, chart, labs and discussed the procedure including the risks, benefits and alternatives for the proposed anesthesia with the patient or authorized representative who has indicated his/her understanding and acceptance.     Dental advisory given  Plan Discussed with: CRNA  Anesthesia Plan Comments:        Anesthesia Quick  Evaluation

## 2020-11-21 ENCOUNTER — Ambulatory Visit (HOSPITAL_BASED_OUTPATIENT_CLINIC_OR_DEPARTMENT_OTHER): Payer: BC Managed Care – PPO | Admitting: Anesthesiology

## 2020-11-21 ENCOUNTER — Ambulatory Visit (HOSPITAL_BASED_OUTPATIENT_CLINIC_OR_DEPARTMENT_OTHER)
Admission: RE | Admit: 2020-11-21 | Discharge: 2020-11-21 | Disposition: A | Discharge status: Home / Self Care | Payer: BC Managed Care – PPO | Attending: Obstetrics and Gynecology | Admitting: Obstetrics and Gynecology

## 2020-11-21 ENCOUNTER — Encounter (HOSPITAL_BASED_OUTPATIENT_CLINIC_OR_DEPARTMENT_OTHER): Payer: Self-pay | Admitting: Obstetrics and Gynecology

## 2020-11-21 ENCOUNTER — Encounter (HOSPITAL_BASED_OUTPATIENT_CLINIC_OR_DEPARTMENT_OTHER)
Admission: RE | Disposition: A | Discharge status: Home / Self Care | Payer: Self-pay | Source: Home / Self Care | Attending: Obstetrics and Gynecology

## 2020-11-21 DIAGNOSIS — K760 Fatty (change of) liver, not elsewhere classified: Secondary | ICD-10-CM | POA: Diagnosis not present

## 2020-11-21 DIAGNOSIS — N889 Noninflammatory disorder of cervix uteri, unspecified: Secondary | ICD-10-CM | POA: Insufficient documentation

## 2020-11-21 DIAGNOSIS — Z8669 Personal history of other diseases of the nervous system and sense organs: Secondary | ICD-10-CM | POA: Diagnosis not present

## 2020-11-21 DIAGNOSIS — Z800 Family history of malignant neoplasm of digestive organs: Secondary | ICD-10-CM | POA: Insufficient documentation

## 2020-11-21 DIAGNOSIS — Z20822 Contact with and (suspected) exposure to covid-19: Secondary | ICD-10-CM | POA: Diagnosis not present

## 2020-11-21 DIAGNOSIS — K449 Diaphragmatic hernia without obstruction or gangrene: Secondary | ICD-10-CM | POA: Diagnosis not present

## 2020-11-21 DIAGNOSIS — Z8379 Family history of other diseases of the digestive system: Secondary | ICD-10-CM | POA: Insufficient documentation

## 2020-11-21 DIAGNOSIS — K439 Ventral hernia without obstruction or gangrene: Secondary | ICD-10-CM | POA: Diagnosis not present

## 2020-11-21 DIAGNOSIS — N888 Other specified noninflammatory disorders of cervix uteri: Secondary | ICD-10-CM | POA: Diagnosis not present

## 2020-11-21 DIAGNOSIS — Z85820 Personal history of malignant melanoma of skin: Secondary | ICD-10-CM | POA: Diagnosis not present

## 2020-11-21 DIAGNOSIS — Z801 Family history of malignant neoplasm of trachea, bronchus and lung: Secondary | ICD-10-CM | POA: Insufficient documentation

## 2020-11-21 DIAGNOSIS — Z8249 Family history of ischemic heart disease and other diseases of the circulatory system: Secondary | ICD-10-CM | POA: Diagnosis not present

## 2020-11-21 DIAGNOSIS — Z9049 Acquired absence of other specified parts of digestive tract: Secondary | ICD-10-CM | POA: Diagnosis not present

## 2020-11-21 DIAGNOSIS — N9412 Deep dyspareunia: Secondary | ICD-10-CM | POA: Insufficient documentation

## 2020-11-21 DIAGNOSIS — Z87891 Personal history of nicotine dependence: Secondary | ICD-10-CM | POA: Insufficient documentation

## 2020-11-21 DIAGNOSIS — D72829 Elevated white blood cell count, unspecified: Secondary | ICD-10-CM | POA: Diagnosis not present

## 2020-11-21 HISTORY — DX: Other specified noninflammatory disorders of cervix uteri: N88.8

## 2020-11-21 HISTORY — DX: Other chronic pain: G89.29

## 2020-11-21 HISTORY — PX: CERVICAL POLYPECTOMY: SHX88

## 2020-11-21 LAB — CBC
HCT: 45.2 % (ref 36.0–46.0)
Hemoglobin: 15 g/dL (ref 12.0–15.0)
MCH: 31.3 pg (ref 26.0–34.0)
MCHC: 33.2 g/dL (ref 30.0–36.0)
MCV: 94.2 fL (ref 80.0–100.0)
Platelets: 114 10*3/uL — ABNORMAL LOW (ref 150–400)
RBC: 4.8 MIL/uL (ref 3.87–5.11)
RDW: 13.1 % (ref 11.5–15.5)
WBC: 5.6 10*3/uL (ref 4.0–10.5)
nRBC: 0 % (ref 0.0–0.2)

## 2020-11-21 LAB — POCT PREGNANCY, URINE: Preg Test, Ur: NEGATIVE

## 2020-11-21 SURGERY — POLYPECTOMY, CERVIX
Anesthesia: General

## 2020-11-21 MED ORDER — OXYCODONE HCL 5 MG PO TABS: 5.0000 mg | ORAL_TABLET | Freq: Once | ORAL | Status: DC | PRN

## 2020-11-21 MED ORDER — SODIUM CHLORIDE 0.9 % IR SOLN
Status: DC | PRN
  Administered 2020-11-21: 500 mL

## 2020-11-21 MED ORDER — PROMETHAZINE HCL 25 MG/ML IJ SOLN: 6.2500 mg | INTRAMUSCULAR | Status: DC | PRN

## 2020-11-21 MED ORDER — KETOROLAC TROMETHAMINE 30 MG/ML IJ SOLN
INTRAMUSCULAR | Status: DC | PRN
  Administered 2020-11-21: 30 mg via INTRAVENOUS

## 2020-11-21 MED ORDER — HYDROCODONE-ACETAMINOPHEN 5-325 MG PO TABS: 1.0000 | ORAL_TABLET | Freq: Four times a day (QID) | ORAL | 0 refills | Status: DC | PRN

## 2020-11-21 MED ORDER — FENTANYL CITRATE (PF) 100 MCG/2ML IJ SOLN
INTRAMUSCULAR | Status: DC | PRN
  Administered 2020-11-21: 100 ug via INTRAVENOUS

## 2020-11-21 MED ORDER — MIDAZOLAM HCL 5 MG/5ML IJ SOLN
INTRAMUSCULAR | Status: DC | PRN
  Administered 2020-11-21: 2 mg via INTRAVENOUS

## 2020-11-21 MED ORDER — PROPOFOL 10 MG/ML IV BOLUS
INTRAVENOUS | Status: DC | PRN
  Administered 2020-11-21: 200 mg via INTRAVENOUS

## 2020-11-21 MED ORDER — LACTATED RINGERS IV SOLN: INTRAVENOUS | Status: DC

## 2020-11-21 MED ORDER — PROPOFOL 10 MG/ML IV BOLUS
INTRAVENOUS | Status: AC
  Filled 2020-11-21: qty 20

## 2020-11-21 MED ORDER — LIDOCAINE HCL (PF) 2 % IJ SOLN
INTRAMUSCULAR | Status: DC | PRN
  Administered 2020-11-21: 7 mL

## 2020-11-21 MED ORDER — PHENYLEPHRINE 40 MCG/ML (10ML) SYRINGE FOR IV PUSH (FOR BLOOD PRESSURE SUPPORT)
PREFILLED_SYRINGE | INTRAVENOUS | Status: DC | PRN
  Administered 2020-11-21: 80 ug via INTRAVENOUS
  Administered 2020-11-21 (×2): 120 ug via INTRAVENOUS
  Administered 2020-11-21: 80 ug via INTRAVENOUS

## 2020-11-21 MED ORDER — LIDOCAINE 2% (20 MG/ML) 5 ML SYRINGE
INTRAMUSCULAR | Status: DC | PRN
  Administered 2020-11-21: 100 mg via INTRAVENOUS

## 2020-11-21 MED ORDER — FENTANYL CITRATE (PF) 100 MCG/2ML IJ SOLN
INTRAMUSCULAR | Status: AC
  Filled 2020-11-21: qty 2

## 2020-11-21 MED ORDER — ARTIFICIAL TEARS OPHTHALMIC OINT
TOPICAL_OINTMENT | OPHTHALMIC | Status: AC
  Filled 2020-11-21: qty 3.5

## 2020-11-21 MED ORDER — MIDAZOLAM HCL 2 MG/2ML IJ SOLN
INTRAMUSCULAR | Status: AC
  Filled 2020-11-21: qty 2

## 2020-11-21 MED ORDER — SCOPOLAMINE 1 MG/3DAYS TD PT72
1.0000 | MEDICATED_PATCH | Freq: Once | TRANSDERMAL | Status: DC
  Administered 2020-11-21: 1.5 mg via TRANSDERMAL

## 2020-11-21 MED ORDER — ACETAMINOPHEN 500 MG PO TABS
1000.0000 mg | ORAL_TABLET | Freq: Once | ORAL | Status: AC
  Administered 2020-11-21: 1000 mg via ORAL

## 2020-11-21 MED ORDER — ONDANSETRON HCL 4 MG/2ML IJ SOLN
INTRAMUSCULAR | Status: AC
  Filled 2020-11-21: qty 2

## 2020-11-21 MED ORDER — EPHEDRINE 5 MG/ML INJ
INTRAVENOUS | Status: AC
  Filled 2020-11-21: qty 10

## 2020-11-21 MED ORDER — FENTANYL CITRATE (PF) 100 MCG/2ML IJ SOLN: 25.0000 ug | INTRAMUSCULAR | Status: DC | PRN

## 2020-11-21 MED ORDER — EPHEDRINE SULFATE-NACL 50-0.9 MG/10ML-% IV SOSY
PREFILLED_SYRINGE | INTRAVENOUS | Status: DC | PRN
  Administered 2020-11-21 (×2): 10 mg via INTRAVENOUS

## 2020-11-21 MED ORDER — KETOROLAC TROMETHAMINE 30 MG/ML IJ SOLN
INTRAMUSCULAR | Status: AC
  Filled 2020-11-21: qty 1

## 2020-11-21 MED ORDER — OXYCODONE HCL 5 MG/5ML PO SOLN
5.0000 mg | Freq: Once | ORAL | Status: DC | PRN
Start: 2020-11-21 — End: 2020-11-21

## 2020-11-21 MED ORDER — LIDOCAINE 2% (20 MG/ML) 5 ML SYRINGE
INTRAMUSCULAR | Status: AC
  Filled 2020-11-21: qty 5

## 2020-11-21 MED ORDER — DEXAMETHASONE SODIUM PHOSPHATE 10 MG/ML IJ SOLN
INTRAMUSCULAR | Status: DC | PRN
  Administered 2020-11-21: 10 mg via INTRAVENOUS

## 2020-11-21 MED ORDER — DEXAMETHASONE SODIUM PHOSPHATE 10 MG/ML IJ SOLN
INTRAMUSCULAR | Status: AC
  Filled 2020-11-21: qty 1

## 2020-11-21 MED ORDER — SCOPOLAMINE 1 MG/3DAYS TD PT72
MEDICATED_PATCH | TRANSDERMAL | Status: AC
  Filled 2020-11-21: qty 1

## 2020-11-21 MED ORDER — ONDANSETRON HCL 4 MG/2ML IJ SOLN
INTRAMUSCULAR | Status: DC | PRN
  Administered 2020-11-21: 4 mg via INTRAVENOUS

## 2020-11-21 MED ORDER — ACETAMINOPHEN 500 MG PO TABS
ORAL_TABLET | ORAL | Status: AC
  Filled 2020-11-21: qty 2

## 2020-11-21 SURGICAL SUPPLY — 22 items
APL SWBSTK 6 STRL LF DISP (MISCELLANEOUS) ×1
APPLICATOR COTTON TIP 6 STRL (MISCELLANEOUS) ×1 IMPLANT
APPLICATOR COTTON TIP 6IN STRL (MISCELLANEOUS) ×2
BLADE SURG 11 STRL SS (BLADE) ×2 IMPLANT
CATH ROBINSON RED A/P 16FR (CATHETERS) ×2 IMPLANT
COVER WAND RF STERILE (DRAPES) ×2 IMPLANT
DECANTER SPIKE VIAL GLASS SM (MISCELLANEOUS) ×2 IMPLANT
ELECT BALL LEEP 5MM RED (ELECTRODE) ×2 IMPLANT
GLOVE ORTHO TXT STRL SZ7.5 (GLOVE) ×2 IMPLANT
GLOVE SURG UNDER POLY LF SZ7 (GLOVE) ×2 IMPLANT
GOWN STRL REUS W/TWL LRG LVL3 (GOWN DISPOSABLE) ×4 IMPLANT
HEMOSTAT SURGICEL 2X14 (HEMOSTASIS) IMPLANT
HEMOSTAT SURGICEL 4X8 (HEMOSTASIS) IMPLANT
KIT TURNOVER CYSTO (KITS) ×2 IMPLANT
NS IRRIG 1000ML POUR BTL (IV SOLUTION) ×2 IMPLANT
PACK VAGINAL WOMENS (CUSTOM PROCEDURE TRAY) ×2 IMPLANT
PAD OB MATERNITY 4.3X12.25 (PERSONAL CARE ITEMS) ×2 IMPLANT
SCOPETTES 8  STERILE (MISCELLANEOUS) ×2
SCOPETTES 8 STERILE (MISCELLANEOUS) ×1 IMPLANT
SPONGE SURGIFOAM ABS GEL 12-7 (HEMOSTASIS) IMPLANT
SUT CHROMIC 1 CT1 27 (SUTURE) ×4 IMPLANT
TOWEL OR 17X26 10 PK STRL BLUE (TOWEL DISPOSABLE) ×4 IMPLANT

## 2020-11-21 NOTE — Anesthesia Procedure Notes (Signed)
Procedure Name: LMA Insertion Date/Time: 11/21/2020 8:05 AM Performed by: Rogers Blocker, CRNA Pre-anesthesia Checklist: Patient identified, Emergency Drugs available, Suction available and Patient being monitored Patient Re-evaluated:Patient Re-evaluated prior to induction Oxygen Delivery Method: Circle System Utilized Preoxygenation: Pre-oxygenation with 100% oxygen Induction Type: IV induction Ventilation: Mask ventilation without difficulty LMA: LMA inserted LMA Size: 4.0 Number of attempts: 1 Placement Confirmation: positive ETCO2 Tube secured with: Tape Dental Injury: Teeth and Oropharynx as per pre-operative assessment

## 2020-11-21 NOTE — Discharge Instructions (Signed)
Pelvic rest  This sheet gives you information about how to care for yourself after your procedure. Your doctor may also give you more specific instructions. If you have problems or questions, contact your doctor. What can I expect after the procedure? After the procedure, it is common to have:  A groggy feeling, if you were given medicine to make you fall asleep (general anesthetic).  Cramps that feel like menstrual cramps.  Bloody discharge or light to moderate bleeding.  Dark fluid from the vagina (vaginal discharge). This fluid may look similar to coffee grounds. Follow these instructions at home: Medicines  Take over-the-counter and prescription medicines only as told by your doctor.  Do not take aspirin until your doctor says it is okay. Activity  Rest as told by your doctor.  For 7-14 days after your procedure, avoid: ? Being very active. ? Exercising. ? Heavy lifting.  Do not lift anything that is heavier than 10 lb (4.5 kg), or the limit that you are told, until your doctor says that it is safe.  Return to your normal activities as told by your doctor. Ask your doctor what activities are safe for you.   General instructions  You may eat your normal diet unless your doctor tells you not to do so.  Drink enough fluid to keep your pee (urine) pale yellow.  Do not take baths, swim, or use a hot tub until your doctor approves. Ask your doctor if you may take showers. You may only be allowed to take sponge baths.  Do not douche, have sex, or put anything in the vagina, including tampons, until your doctor says it is okay.  Keep all follow-up visits as told by your doctor. This is important.   Contact a doctor if:  You get a rash.  You are dizzy or light-headed.  You feel like you may vomit (nauseous).  You vomit.  You have fluid from your vagina that smells bad. Get help right away if:  There are bloody clumps (clots) coming from your vagina.  You have more  bleeding than you would have in a normal menstrual period. For example, you soak a pad in less than 1 hour.  You have a fever.  You have more and more cramps.  You pass out (faint).  You have pain when peeing.  You have very bad pain, or your pain gets worse. Medicine does not help your pain.  You have blood in your pee. Summary  After the procedure, it is common to have cramps, some bleeding, and dark or bloody fluid from your vagina.  Do not douche, have sex, or use tampons until your doctor says it is okay.  For about 7-14 days after your procedure, try not to exercise or lift heavy objects. This information is not intended to replace advice given to you by your health care provider. Make sure you discuss any questions you have with your health care provider. Document Revised: 01/15/2019 Document Reviewed: 01/15/2019 Elsevier Patient Education  2021 [place] Instructions  Activity: Get plenty of rest for the remainder of the day. A responsible individual must stay with you for 24 hours following the procedure.  For the next 24 hours, DO NOT: -Drive a car -Paediatric nurse -Drink alcoholic beverages -Take any medication unless instructed by your physician -Make any legal decisions or sign important papers.  Meals: Start with liquid foods such as gelatin or soup. Progress to regular foods as tolerated. Avoid  greasy, spicy, heavy foods. If nausea and/or vomiting occur, drink only clear liquids until the nausea and/or vomiting subsides. Call your physician if vomiting continues.  Special Instructions/Symptoms: Your throat may feel dry or sore from the anesthesia or the breathing tube placed in your throat during surgery. If this causes discomfort, gargle with warm salt water. The discomfort should disappear within 24 hours.  If you had a scopolamine patch placed behind your ear for the management of post- operative nausea and/or  vomiting:  1. The medication in the patch is effective for 72 hours, after which it should be removed.  Wrap patch in a tissue and discard in the trash. Wash hands thoroughly with soap and water. 2. You may remove the patch earlier than 72 hours if you experience unpleasant side effects which may include dry mouth, dizziness or visual disturbances. 3. Avoid touching the patch. Wash your hands with soap and water after contact with the patch.  No ibuprofen, Advil, Aleve, Motrin, or naproxen until after 2:45 pm today if needed. No Tylenol/acetaminophen until after 1:00 pm today if needed.

## 2020-11-21 NOTE — Anesthesia Postprocedure Evaluation (Signed)
Anesthesia Post Note  Patient: Meghan Wilcox  Procedure(s) Performed: CERVICAL POLYPECTOMY/REMOVAL OF MASS (N/A )     Patient location during evaluation: PACU Anesthesia Type: General Level of consciousness: awake and alert and oriented Pain management: pain level controlled Vital Signs Assessment: post-procedure vital signs reviewed and stable Respiratory status: spontaneous breathing, nonlabored ventilation and respiratory function stable Cardiovascular status: blood pressure returned to baseline Postop Assessment: no apparent nausea or vomiting Anesthetic complications: no   No complications documented.  Last Vitals:  Vitals:   11/21/20 0900 11/21/20 0915  BP: 117/73 106/76  Pulse: 93 81  Resp: 12 (!) 8  Temp:  36.5 C  SpO2: 95% 91%    Last Pain:  Vitals:   11/21/20 0915  TempSrc:   PainSc: 0-No pain                 Brennan Bailey

## 2020-11-21 NOTE — Transfer of Care (Signed)
Immediate Anesthesia Transfer of Care Note  Patient: Meghan Wilcox  Procedure(s) Performed: CERVICAL POLYPECTOMY/REMOVAL OF MASS (N/A )  Patient Location: PACU  Anesthesia Type:General  Level of Consciousness: awake, alert , oriented and patient cooperative  Airway & Oxygen Therapy: Patient Spontanous Breathing  Post-op Assessment: Report given to RN and Post -op Vital signs reviewed and stable  Post vital signs: Reviewed and stable  Last Vitals:  Vitals Value Taken Time  BP 118/74 11/21/20 0850  Temp 36.4 C 11/21/20 0850  Pulse 93 11/21/20 0853  Resp 13 11/21/20 0853  SpO2 94 % 11/21/20 0853  Vitals shown include unvalidated device data.  Last Pain:  Vitals:   11/21/20 0644  TempSrc: Oral  PainSc: 0-No pain      Patients Stated Pain Goal: 8 (16/10/96 0454)  Complications: No complications documented.

## 2020-11-21 NOTE — Op Note (Signed)
Preop diagnosis: Dyspareunia, cervical mass Postoperative diagnosis: Same Procedure: Removal of cervical mass Surgeon: Cheri Fowler, MD Anesthesia General with an LMA, local with 2% lidocaine Estimated blood loss: 50 cc Findings: She had a 2 cm anterior cervical mass Specimens: Cervical mass for routine pathology Complications: None  Procedure in detail: Patient was taken to the operating room and placed in the dorsal supine position.  General anesthesia was induced.  She was placed in mobile stirrups.  The vagina and perineum were then prepped and draped in the usual sterile fashion.  A weighted speculum was used as well as a Primary school teacher to expose the cervix.  Exam had revealed a small anterior cervical mass.  A horizontal incision was made in the cervix over this mass with Bovie.  I was then able to use Bovie and sharp dissection to come around this mass and remove it.  Bleeding was controlled with Bovie.  The incision was then closed with 2 figure-of-eight sutures of #1 chromic with adequate closure and adequate hemostasis.  The suture line was then infiltrated with 7 cc of 2% lidocaine.  Bleeding was then controlled with pressure.  All instruments were then removed from the vagina.  The patient tolerated the procedure well.  She had PAS hose on throughout the procedure.  She was taken to the recovery room in stable condition.

## 2020-11-21 NOTE — Interval H&P Note (Signed)
History and Physical Interval Note:  11/21/2020 7:46 AM  Meghan Wilcox  has presented today for surgery, with the diagnosis of dyspareunia.  The various methods of treatment have been discussed with the patient and family. After consideration of risks, benefits and other options for treatment, the patient has consented to  Procedure(s): CERVICAL POLYPECTOMY/REMOVAL OF MASS (N/A) as a surgical intervention.  The patient's history has been reviewed, patient examined, no change in status, stable for surgery.  I have reviewed the patient's chart and labs.  Questions were answered to the patient's satisfaction.     Blane Ohara Kutter Schnepf

## 2020-11-24 ENCOUNTER — Encounter (HOSPITAL_BASED_OUTPATIENT_CLINIC_OR_DEPARTMENT_OTHER): Payer: Self-pay | Admitting: Obstetrics and Gynecology

## 2020-11-24 LAB — SURGICAL PATHOLOGY

## 2020-12-11 ENCOUNTER — Ambulatory Visit (HOSPITAL_COMMUNITY)
Admission: EM | Admit: 2020-12-11 | Discharge: 2020-12-12 | Disposition: A | Discharge status: Home / Self Care | Payer: BC Managed Care – PPO | Attending: Emergency Medicine | Admitting: Emergency Medicine

## 2020-12-11 ENCOUNTER — Encounter (HOSPITAL_COMMUNITY): Payer: Self-pay

## 2020-12-11 ENCOUNTER — Other Ambulatory Visit: Payer: Self-pay

## 2020-12-11 DIAGNOSIS — N939 Abnormal uterine and vaginal bleeding, unspecified: Secondary | ICD-10-CM

## 2020-12-11 DIAGNOSIS — Z20822 Contact with and (suspected) exposure to covid-19: Secondary | ICD-10-CM | POA: Insufficient documentation

## 2020-12-11 DIAGNOSIS — Y838 Other surgical procedures as the cause of abnormal reaction of the patient, or of later complication, without mention of misadventure at the time of the procedure: Secondary | ICD-10-CM | POA: Diagnosis not present

## 2020-12-11 DIAGNOSIS — N99820 Postprocedural hemorrhage and hematoma of a genitourinary system organ or structure following a genitourinary system procedure: Secondary | ICD-10-CM | POA: Diagnosis not present

## 2020-12-11 DIAGNOSIS — Z79899 Other long term (current) drug therapy: Secondary | ICD-10-CM | POA: Insufficient documentation

## 2020-12-11 DIAGNOSIS — N816 Rectocele: Secondary | ICD-10-CM | POA: Diagnosis not present

## 2020-12-11 DIAGNOSIS — K219 Gastro-esophageal reflux disease without esophagitis: Secondary | ICD-10-CM | POA: Insufficient documentation

## 2020-12-11 DIAGNOSIS — N9989 Other postprocedural complications and disorders of genitourinary system: Secondary | ICD-10-CM | POA: Diagnosis not present

## 2020-12-11 NOTE — ED Provider Notes (Signed)
Emergency Medicine Provider Triage Evaluation Note  Meghan Wilcox , a 45 y.o. female  was evaluated in triage.  Pt complains of vaginal bleeding.  Patient reports a cervical mass removal by Dr. Waylan Rocher 3 weeks ago.  She states that approximately 2 weeks ago she started to have some bleeding, was seen in the office, he referred that the wound had dehisced, and he attempted to place sutures in the office but this was unsuccessful.  Patient states that he had put quick clot on the wound and the bleeding had stopped.  She has not had any bleeding up until today, she reports that she has horrible reflux at night, and was in the bathroom vomiting.  He noticed a gush of blood as she was vomiting, and has continued to profusely bleed since then.  This occurred around 10 PM.  She states that she has changed 3 blood soaked pads since 10 PM.  Ports that the cervical mass removal was external.  Denies any dizziness, syncope.  Review of Systems  Positive:  Negative:   Physical Exam  BP (!) 141/89   Pulse (!) 101   Temp 98.3 F (36.8 C)   Resp (!) 24   Ht 5' 2"  (1.575 m)   Wt 108.9 kg   SpO2 95%   BMI 43.90 kg/m  Gen:   Awake, no distress   Resp:  Normal effort  MSK:   Moves extremities without difficulty  Other:    Medical Decision Making  Medically screening exam initiated at 11:41 PM.  Appropriate orders placed.  Meghan Wilcox was informed that the remainder of the evaluation will be completed by another provider, this initial triage assessment does not replace that evaluation, and the importance of remaining in the ED until their evaluation is complete.     Lyndel Safe 12/11/20 Darci Needle    Daleen Bo, MD 12/11/20 [phone]

## 2020-12-11 NOTE — ED Triage Notes (Signed)
Pt to ED by POV from home with c/o vaginal bleeding. Pt states she had a cervical mass removed 3 weeks ago and has been have some complications intermittently. Pt reports profuse bleeding and pain 5/10. Arrives A+O, VSS, NADN.

## 2020-12-11 NOTE — ED Notes (Signed)
Lab draw unsuccessful 

## 2020-12-12 ENCOUNTER — Emergency Department (HOSPITAL_COMMUNITY): Payer: BC Managed Care – PPO | Admitting: Certified Registered Nurse Anesthetist

## 2020-12-12 ENCOUNTER — Encounter (HOSPITAL_COMMUNITY)
Admission: EM | Disposition: A | Discharge status: Home / Self Care | Payer: Self-pay | Source: Home / Self Care | Attending: Emergency Medicine

## 2020-12-12 DIAGNOSIS — F411 Generalized anxiety disorder: Secondary | ICD-10-CM | POA: Diagnosis not present

## 2020-12-12 DIAGNOSIS — N99820 Postprocedural hemorrhage and hematoma of a genitourinary system organ or structure following a genitourinary system procedure: Secondary | ICD-10-CM | POA: Diagnosis not present

## 2020-12-12 DIAGNOSIS — D72829 Elevated white blood cell count, unspecified: Secondary | ICD-10-CM | POA: Diagnosis not present

## 2020-12-12 DIAGNOSIS — K449 Diaphragmatic hernia without obstruction or gangrene: Secondary | ICD-10-CM | POA: Diagnosis not present

## 2020-12-12 HISTORY — PX: PERINEAL LACERATION REPAIR: SHX5389

## 2020-12-12 LAB — CBC
HCT: 41.8 % (ref 36.0–46.0)
Hemoglobin: 13.9 g/dL (ref 12.0–15.0)
MCH: 31.1 pg (ref 26.0–34.0)
MCHC: 33.3 g/dL (ref 30.0–36.0)
MCV: 93.5 fL (ref 80.0–100.0)
Platelets: 134 10*3/uL — ABNORMAL LOW (ref 150–400)
RBC: 4.47 MIL/uL (ref 3.87–5.11)
RDW: 12.9 % (ref 11.5–15.5)
WBC: 6.7 10*3/uL (ref 4.0–10.5)
nRBC: 0 % (ref 0.0–0.2)

## 2020-12-12 LAB — BASIC METABOLIC PANEL
Anion gap: 5 (ref 5–15)
BUN: 9 mg/dL (ref 6–20)
CO2: 26 mmol/L (ref 22–32)
Calcium: 9.2 mg/dL (ref 8.9–10.3)
Chloride: 108 mmol/L (ref 98–111)
Creatinine, Ser: 0.65 mg/dL (ref 0.44–1.00)
GFR, Estimated: >60 mL/min (ref >60)
Glucose, Bld: 143 mg/dL — ABNORMAL HIGH (ref 70–99)
Potassium: 3.9 mmol/L (ref 3.5–5.1)
Sodium: 139 mmol/L (ref 135–145)

## 2020-12-12 LAB — TYPE AND SCREEN
ABO/RH(D): O POS
Antibody Screen: NEGATIVE

## 2020-12-12 LAB — RESP PANEL BY RT-PCR (FLU A&B, COVID) ARPGX2
Influenza A by PCR: NEGATIVE
Influenza B by PCR: NEGATIVE
SARS Coronavirus 2 by RT PCR: NEGATIVE

## 2020-12-12 LAB — I-STAT BETA HCG BLOOD, ED (MC, WL, AP ONLY): I-stat hCG, quantitative: <5 m[IU]/mL (ref <5)

## 2020-12-12 SURGERY — SUTURE REPAIR, LACERATION, PERINEUM
Anesthesia: General | Site: Vagina

## 2020-12-12 MED ORDER — DEXAMETHASONE SODIUM PHOSPHATE 10 MG/ML IJ SOLN
INTRAMUSCULAR | Status: AC
  Filled 2020-12-12: qty 1

## 2020-12-12 MED ORDER — MIDAZOLAM HCL 5 MG/5ML IJ SOLN
INTRAMUSCULAR | Status: DC | PRN
  Administered 2020-12-12: 2 mg via INTRAVENOUS

## 2020-12-12 MED ORDER — FENTANYL CITRATE (PF) 100 MCG/2ML IJ SOLN
INTRAMUSCULAR | Status: AC
  Filled 2020-12-12: qty 2

## 2020-12-12 MED ORDER — ONDANSETRON HCL 4 MG/2ML IJ SOLN
INTRAMUSCULAR | Status: AC
  Filled 2020-12-12: qty 2

## 2020-12-12 MED ORDER — LIDOCAINE HCL (PF) 1 % IJ SOLN
INTRAMUSCULAR | Status: DC | PRN
Start: 2020-12-12 — End: 2020-12-12
  Administered 2020-12-12: 10 mL

## 2020-12-12 MED ORDER — HYDROCODONE-ACETAMINOPHEN 5-325 MG PO TABS: 1.0000 | ORAL_TABLET | Freq: Four times a day (QID) | ORAL | 0 refills | Status: DC | PRN

## 2020-12-12 MED ORDER — LIDOCAINE 2% (20 MG/ML) 5 ML SYRINGE
INTRAMUSCULAR | Status: DC | PRN
  Administered 2020-12-12: 100 mg via INTRAVENOUS

## 2020-12-12 MED ORDER — LIDOCAINE 2% (20 MG/ML) 5 ML SYRINGE
INTRAMUSCULAR | Status: AC
  Filled 2020-12-12: qty 5

## 2020-12-12 MED ORDER — 0.9 % SODIUM CHLORIDE (POUR BTL) OPTIME
TOPICAL | Status: DC | PRN
  Administered 2020-12-12: 1000 mL

## 2020-12-12 MED ORDER — OXYCODONE HCL 5 MG PO TABS
5.0000 mg | ORAL_TABLET | Freq: Once | ORAL | Status: DC | PRN
Start: 2020-12-12 — End: 2020-12-12

## 2020-12-12 MED ORDER — LACTATED RINGERS IV SOLN: INTRAVENOUS | Status: DC | PRN

## 2020-12-12 MED ORDER — OXYCODONE HCL 5 MG/5ML PO SOLN: 5.0000 mg | Freq: Once | ORAL | Status: DC | PRN

## 2020-12-12 MED ORDER — DEXAMETHASONE SODIUM PHOSPHATE 10 MG/ML IJ SOLN
INTRAMUSCULAR | Status: DC | PRN
  Administered 2020-12-12: 10 mg via INTRAVENOUS

## 2020-12-12 MED ORDER — PROPOFOL 10 MG/ML IV BOLUS
INTRAVENOUS | Status: AC
  Filled 2020-12-12: qty 20

## 2020-12-12 MED ORDER — FENTANYL CITRATE (PF) 100 MCG/2ML IJ SOLN
INTRAMUSCULAR | Status: DC | PRN
  Administered 2020-12-12 (×2): 50 ug via INTRAVENOUS

## 2020-12-12 MED ORDER — PROPOFOL 10 MG/ML IV BOLUS
INTRAVENOUS | Status: DC | PRN
  Administered 2020-12-12: 150 mg via INTRAVENOUS

## 2020-12-12 MED ORDER — MIDAZOLAM HCL 2 MG/2ML IJ SOLN
INTRAMUSCULAR | Status: AC
  Filled 2020-12-12: qty 2

## 2020-12-12 MED ORDER — SUCCINYLCHOLINE CHLORIDE 200 MG/10ML IV SOSY
PREFILLED_SYRINGE | INTRAVENOUS | Status: DC | PRN
  Administered 2020-12-12: 160 mg via INTRAVENOUS

## 2020-12-12 MED ORDER — ONDANSETRON HCL 4 MG/2ML IJ SOLN
4.0000 mg | Freq: Once | INTRAMUSCULAR | Status: AC | PRN
  Administered 2020-12-12: 4 mg via INTRAVENOUS

## 2020-12-12 MED ORDER — LIDOCAINE HCL (PF) 1 % IJ SOLN
INTRAMUSCULAR | Status: AC
  Filled 2020-12-12: qty 30

## 2020-12-12 MED ORDER — LIDOCAINE HCL (PF) 1 % IJ SOLN
30.0000 mL | Freq: Once | INTRAMUSCULAR | Status: DC
  Filled 2020-12-12: qty 30

## 2020-12-12 MED ORDER — FENTANYL CITRATE (PF) 100 MCG/2ML IJ SOLN: 25.0000 ug | INTRAMUSCULAR | Status: DC | PRN

## 2020-12-12 SURGICAL SUPPLY — 26 items
BLADE SURG 15 STRL LF DISP TIS (BLADE) ×2 IMPLANT
BLADE SURG 15 STRL SS (BLADE) ×4
CATH ROBINSON RED A/P 16FR (CATHETERS) ×2 IMPLANT
COVER SURGICAL LIGHT HANDLE (MISCELLANEOUS) ×2 IMPLANT
DRAPE STERI POUCH LG 24X46 STR (DRAPES) ×2 IMPLANT
DRAPE SURG IRRIG POUCH 19X23 (DRAPES) ×2 IMPLANT
ELECT BLADE TIP CTD 4 INCH (ELECTRODE) ×2 IMPLANT
GAUZE SPONGE 4X4 12PLY STRL (GAUZE/BANDAGES/DRESSINGS) ×2 IMPLANT
GLOVE SURG ENC MOIS LTX SZ6.5 (GLOVE) ×2 IMPLANT
GLOVE SURG ENC MOIS LTX SZ7.5 (GLOVE) ×4 IMPLANT
GLOVE SURG ENC MOIS LTX SZ8 (GLOVE) ×2 IMPLANT
GLOVE SURG ENC TEXT LTX SZ7.5 (GLOVE) ×2 IMPLANT
GLOVE SURG LTX SZ7.5 (GLOVE) ×2 IMPLANT
GLOVE SURG LTX SZ8 (GLOVE) ×2 IMPLANT
GLOVE SURG UNDER POLY LF SZ7 (GLOVE) ×2 IMPLANT
GOWN STRL REUS W/ TWL XL LVL3 (GOWN DISPOSABLE) ×2 IMPLANT
GOWN STRL REUS W/TWL XL LVL3 (GOWN DISPOSABLE) ×4
KIT BASIN OR (CUSTOM PROCEDURE TRAY) ×2 IMPLANT
KIT TURNOVER KIT A (KITS) ×2 IMPLANT
PACK LITHOTOMY IV (CUSTOM PROCEDURE TRAY) ×2 IMPLANT
PENCIL SMOKE EVACUATOR (MISCELLANEOUS) ×2 IMPLANT
SOL PREP PROV IODINE SCRUB 4OZ (MISCELLANEOUS) ×2 IMPLANT
SPONGE LAP 18X18 RF (DISPOSABLE) ×4 IMPLANT
SUT VIC AB 0 UR5 27 (SUTURE) ×2 IMPLANT
TOWEL OR 17X26 10 PK STRL BLUE (TOWEL DISPOSABLE) ×2 IMPLANT
TUBING CONNECTING 10 (TUBING) ×2 IMPLANT

## 2020-12-12 NOTE — ED Notes (Signed)
PACU is here for patient.

## 2020-12-12 NOTE — ED Notes (Signed)
Patients has vaginal bleeding with mostly large clots forming. Pad and linens were changed.

## 2020-12-12 NOTE — H&P (Signed)
Meghan Wilcox is an 45 y.o. female who presents to ED with complaints of sudden-onset bleeding, bright red with clots soaking a maxi pad in under an hour. Of note, is s/p a cervical mass removal on 4/22 with Dr Willis Modena, postop course complicated by suture pop and use of Monsels to control bleeding in-office on 12/05/20 (Patient did not tolerate in-office paracervical block without pre-medication). Mass removed 2/2 pain with manipulation and intercourse. Denies light-headedness, SOB, weakness, being on anticoagulation, bleeding/clotting disorder  Pertinent Gynecological History: Last mammogram: normal Date: 09/2020 Last pap: normal Date: 09/2020 OB History: G5, P3023 SVD x 3      Menstrual History: No LMP recorded. Patient has had an ablation.    Past Medical History:  Diagnosis Date  . Allergy    seasonal  . Blood transfusion age 74yr   Antibody scewwn neg. 08/20/10 prior to cholecystectomy  . 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 Cervical mass   . Chronic back pain   . Diverticulitis   . Family history of malignant neoplasm of gastrointestinal tract   . Fatty liver   . Gastroparesis   . GERD (gastroesophageal reflux disease)   . Hepatomegaly   . History of ovarian cyst   . Scoliosis   . Seizures ([facility]    post partum  eclampsia  22 yrs ago    Past Surgical History:  Procedure Laterality Date  . BLADDER SUSPENSION  10/01/2011   Procedure: TRANSVAGINAL TAPE (TVT) PROCEDURE;  Surgeon: TClarene Duke MD;  [facility];  Service: Gynecology;  Laterality: N/A;  Solyx Sling  . CERVICAL POLYPECTOMY N/A 11/21/2020   Procedure: CERVICAL POLYPECTOMY/REMOVAL OF MASS;  Surgeon: MCheri Fowler MD;  [facility]  Service: Gynecology;  Laterality: N/A;  . CHOLECYSTECTOMY  jan 2012   Dr ARonnald Collum . CYSTOSCOPY  10/01/2011   Procedure: CYSTOSCOPY;  Surgeon: TClarene Duke MD;  [facility];  Service: Gynecology;  Laterality:  N/A;  . INNER EAR SURGERY Left age 45 . KNEE SURGERY Left age 89  arthroscopic  . LAPAROSCOPY  10/01/2011   Procedure: LAPAROSCOPY OPERATIVE;  Surgeon: TClarene Duke MD;  [facility];  Service: Gynecology;  Laterality: N/A;  removal of right fimbria, fulgeration of endometriosis  . Steel rod in her back  age [id number]  scoliosis  . TONSILLECTOMY  as child  . TUBAL LIGATION  april 31 2007  . uterine ablation  2008  . WISDOM TOOTH EXTRACTION      Family History  Problem Relation Age of Onset  . Diabetes Mother   . Cirrhosis Mother   . Irritable bowel syndrome Mother   . Crohn's disease Mother   . Lung cancer Mother   . Heart disease Mother   . Diabetes Father   . Heart disease Father   . Liver disease Father   . Cirrhosis Father   . Diabetes Brother   . Colon cancer Paternal Grandfather 724 . Colon cancer Other   . Rectal cancer Other   . Esophageal cancer Neg Hx   . Stomach cancer Neg Hx     Social History:  reports that she quit smoking about 10 months ago. Her smoking use included cigarettes. She has a 30.00 pack-year smoking history. She has never used smokeless tobacco. She reports current alcohol use. She reports that she does not use drugs.  Allergies: No Known Allergies  (Not in a hospital  admission)   Review of Systems  Constitutional: Negative for chills and fever.  Respiratory: Negative for shortness of breath.   Cardiovascular: Negative for chest pain, palpitations and leg swelling.  Gastrointestinal: Negative for abdominal pain, nausea and vomiting.  Genitourinary: Positive for pelvic pain and vaginal pain.  Neurological: Negative for dizziness, weakness and headaches.  Psychiatric/Behavioral: Negative for suicidal ideas.    Blood pressure 136/88, pulse 85, temperature 98.3 F (36.8 C), resp. rate 16, height 5' 2"  (1.575 m), weight 108.9 kg, SpO2 96 %. Physical Exam Exam conducted with a chaperone present.  Constitutional:      General: She is not  in acute distress.    Appearance: She is well-developed. She is morbidly obese.  HENT:     Head: Normocephalic and atraumatic.  Eyes:     Pupils: Pupils are equal, round, and reactive to light.  Cardiovascular:     Rate and Rhythm: Normal rate and regular rhythm.     Heart sounds: No murmur heard. No gallop.   Abdominal:     Tenderness: There is no abdominal tenderness. There is no guarding or rebound.     Comments: Obese but soft overall, NTTP throughout  Genitourinary:    Vagina: Normal.     Cervix: Cervical motion tenderness and cervical bleeding present. No discharge or friability.        Comments: Dark red dried bleeding in groin, red blood at perineum Patient uncomfortable with speculum insertion. Redundant vaginal tissue. Small 1-2cm clots extruded from vaginal vault during insertion of speculum. Once visible, slow oozing from 1-2 o clock noted consistent with prior surgical bed. No VB from os nor discharge. Unable to perform appropriate BME 2/2 patient discomfort and incorrect stretcher  Musculoskeletal:        General: Normal range of motion.     Cervical back: Normal range of motion and neck supple.  Skin:    General: Skin is warm and dry.  Neurological:     Mental Status: She is alert and oriented to person, place, and time.     Results for orders placed or performed during the hospital encounter of 12/11/20 (from the past 24 hour(s))  CBC     Status: Abnormal   Collection Time: 12/12/20  1:23 AM  Result Value Ref Range   WBC 6.7 4.0 - 10.5 K/uL   RBC 4.47 3.87 - 5.11 MIL/uL   Hemoglobin 13.9 12.0 - 15.0 g/dL   HCT 41.8 36.0 - 46.0 %   MCV 93.5 80.0 - 100.0 fL   MCH 31.1 26.0 - 34.0 pg   MCHC 33.3 30.0 - 36.0 g/dL   RDW 12.9 11.5 - 15.5 %   Platelets 134 (L) 150 - 400 K/uL   nRBC 0.0 0.0 - 0.2 %  Basic metabolic panel     Status: Abnormal   Collection Time: 12/12/20  1:23 AM  Result Value Ref Range   Sodium 139 135 - 145 mmol/L   Potassium 3.9 3.5 - 5.1  mmol/L   Chloride 108 98 - 111 mmol/L   CO2 26 22 - 32 mmol/L   Glucose, Bld 143 (H) 70 - 99 mg/dL   BUN 9 6 - 20 mg/dL   Creatinine, Ser 0.65 0.44 - 1.00 mg/dL   Calcium 9.2 8.9 - 10.3 mg/dL   GFR, Estimated >60 >60 mL/min   Anion gap 5 5 - 15  Type and screen [place]     Status: None   Collection Time: 12/12/20  1:29 AM  Result Value Ref Range   ABO/RH(D) O POS    Antibody Screen NEG    Sample Expiration      12/15/2020,2359 Performed at [facility], [place] [address] [place], [place] [address]   I-Stat Beta hCG blood, ED ([facility], [facility], [facility] only)     Status: None   Collection Time: 12/12/20  1:30 AM  Result Value Ref Range   I-stat hCG, quantitative <5.0 <5 mIU/mL   Comment 3            No results found.  Assessment/Plan: This is a 45yo [id number] here with postoperative bleeding from cervical laceration three weeks after benign 2cm cervical mass removal. Attempted to assess fully at bedside in hopes of repair at bedside and to avoid surgery, however patient did not tolerate speculum well. Additionally, patient habitus plus redundant tissue makes appropriate visualization difficult. Decision made to, therefore, move towards surgical repair in OR. Main OR called, GYN team being brought in. COVID pending, CBC on admit WNL, plt 130s.  RBA of procedure reviewed including injury to surrounding organs (vulva, vagina, uterus), bleeding, infection.  Patient agrees to procedure as reviewed.     Proceed to OR when ready   Deliah Boston 12/12/2020, 3:33 AM

## 2020-12-12 NOTE — Op Note (Signed)
12/12/20  Surgeon: Eula Flax, MD Pre-operative diagnosis: Postoperative bleeding from cervical excision site Postoperative diagnosis: same as above Indications: Significant EBL, inability to tolerate bedside exam EBL: <10cc from procedure IVF: 500cc LR UOP: 100cc clear yellow urine via red rubber prior to procedure: Anesthesia: General by Dr Kalman Shan Findings: External genitalia within normal limits.  Upon insertion of operative speculum, significant rectocele present, no protrusion beyond introitus.  Multiparous cervix initially obscured by redundant vaginal tissue.  Overall located very anteriorly.  Once entirety of cervix visualized, area from 12-1 o'clock evident in semilunar fashion consistent with prior loop wire excision technique leaving a partial-thickness defect circumferentially.  Bleeding noted to be from wound bed and from 12:00 anterior cervical edge.  Rest of cervix within normal limits and free of lesions.  Vagina within normal limits.  Bimanual exam unremarkable and limited by habitus.  Consent: RBA of procedure reviewed including injury to surrounding organs (vulva, vagina, uterus), bleeding, infection.  Patient agrees to procedure as reviewed.  Operative technique: The patient was taken to the operating room where she was prepped and draped in a sterile fashion in dorsolithotomy.  COVID screening negative prior to procedure, no antibiotics given.  S CDs placed and turned on appropriately.  Surgical timeout was performed.  Bladder emptied using red rubber as above.  Upon insertion of operative speculum, findings noted as above.  1% lidocaine plain injected for paracervical block.  Bovie cautery used to achieve hemostasis initially on wound bed and prior cut edge as mentioned above in findings.  However, persistent bleeding noted at 12:00 edge near apex of incision.  Therefore, decision made to use 0 Vicryl on UR 6 for closure.  Wound edges brought together in running fashion until  overall 1 cm defect was closed.  Excellent hemostasis was noted at completion of procedure.  All counts correct x2 after instruments were removed from vagina.  The patient was taken to PACU in good condition.

## 2020-12-12 NOTE — Anesthesia Preprocedure Evaluation (Signed)
Anesthesia Evaluation  Patient identified by MRN, date of birth, ID band Patient awake    Reviewed: Allergy & Precautions, NPO status , Patient's Chart, lab work & pertinent test results  Airway Mallampati: II  TM Distance: <3 FB Neck ROM: Full    Dental no notable dental hx.    Pulmonary neg pulmonary ROS, Patient abstained from smoking., former smoker,    Pulmonary exam normal breath sounds clear to auscultation       Cardiovascular negative cardio ROS Normal cardiovascular exam Rhythm:Regular Rate:Normal     Neuro/Psych negative neurological ROS  negative psych ROS   GI/Hepatic Neg liver ROS, GERD  ,  Endo/Other  Morbid obesity  Renal/GU negative Renal ROS  negative genitourinary   Musculoskeletal negative musculoskeletal ROS (+)   Abdominal (+) + obese,   Peds negative pediatric ROS (+)  Hematology negative hematology ROS (+)   Anesthesia Other Findings   Reproductive/Obstetrics negative OB ROS                             Anesthesia Physical Anesthesia Plan  ASA: III and emergent  Anesthesia Plan: General   Post-op Pain Management:    Induction: Intravenous and Rapid sequence  PONV Risk Score and Plan: 3 and Ondansetron and Dexamethasone  Airway Management Planned: Oral ETT  Additional Equipment:   Intra-op Plan:   Post-operative Plan: Extubation in OR  Informed Consent: I have reviewed the patients History and Physical, chart, labs and discussed the procedure including the risks, benefits and alternatives for the proposed anesthesia with the patient or authorized representative who has indicated his/her understanding and acceptance.     Dental advisory given  Plan Discussed with: CRNA and Surgeon  Anesthesia Plan Comments:         Anesthesia Quick Evaluation

## 2020-12-12 NOTE — ED Provider Notes (Signed)
[facility] DEPT Provider Note   CSN: [id number] Arrival date & time: 12/11/20  2305     History Chief Complaint  Patient presents with  . Vaginal Bleeding    Meghan Wilcox is a 45 y.o. female.  The history is provided by the patient.  Vaginal Bleeding Quality:  Bright red Severity:  Severe Onset quality:  Gradual Duration:  4 hours Timing:  Intermittent Progression:  Worsening Chronicity:  New Relieved by:  Nothing Worsened by:  Activity Associated symptoms: abdominal pain   Associated symptoms: no dysuria and no fever   Risk factors: no bleeding disorder   Patient reports she underwent a cervical mass removal on April 22.  Since that time she has had intermittent bleeding and has seen her OB/GYN who applied quick clot to the wound.  She was told that the stitches "busted" she reports tonight she vomited after having acid reflux, and then began having significant vaginal bleeding.  She reports mild abdominal cramping.  She reports the bleeding is continuing.  She is not on anticoagulation     Past Medical History:  Diagnosis Date  . Allergy    seasonal  . Blood transfusion age 27yr   Antibody scewwn neg. 08/20/10 prior to cholecystectomy  . Cancer ([place]   2 spots melanoma removed under left breast  . Cervical mass   . Chronic back pain   . Diverticulitis   . Family history of malignant neoplasm of gastrointestinal tract   . Fatty liver   . Gastroparesis   . GERD (gastroesophageal reflux disease)   . Hepatomegaly   . History of ovarian cyst   . Scoliosis   . Seizures ([place]    post partum  eclampsia  22 yrs ago    Patient Active Problem List   Diagnosis Date Noted  . GAD (generalized anxiety disorder) 01/16/2019  .    01/16/2019  . Cellulitis and abscess of leg 09/22/2018  . Smoker 09/19/2018  . Hiatal hernia 06/12/2018  . Healthcare maintenance 05/09/2018  . BMI 40.0-44.9, adult ([place] 05/09/2018  . Plantar  fasciitis of right foot 02/11/2015  . 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 Diarrhea 08/22/2014  . Colitis, acute 08/21/2014  . Leukocytosis 08/21/2014  . Chest pain 03/31/2014  . Ventral hernia 11/12/2013  . SUI (stress urinary incontinence, female) 10/01/2011  . Pelvic pain in female 10/01/2011    Past Surgical History:  Procedure Laterality Date  . BLADDER SUSPENSION  10/01/2011   Procedure: TRANSVAGINAL TAPE (TVT) PROCEDURE;  Surgeon: TClarene Duke MD;  [facility];  Service: Gynecology;  Laterality: N/A;  Solyx Sling  . CERVICAL POLYPECTOMY N/A 11/21/2020   Procedure: CERVICAL POLYPECTOMY/REMOVAL OF MASS;  Surgeon: MCheri Fowler MD;  [facility] [facility]  Service: Gynecology;  Laterality: N/A;  . CHOLECYSTECTOMY  jan 2012   Dr ARonnald Collum . CYSTOSCOPY  10/01/2011   Procedure: CYSTOSCOPY;  Surgeon: TClarene Duke MD;  [facility];  Service: Gynecology;  Laterality: N/A;  . INNER EAR SURGERY Left age 45 . KNEE SURGERY Left age 45  arthroscopic  . LAPAROSCOPY  10/01/2011   Procedure: LAPAROSCOPY OPERATIVE;  Surgeon: TClarene Duke MD;  [facility];  Service: Gynecology;  Laterality: N/A;  removal of right fimbria, fulgeration of endometriosis  . Steel rod in her back  age 89  scoliosis  . TONSILLECTOMY  as child  .  TUBAL LIGATION  april 31 2007  . uterine ablation  2008  . WISDOM TOOTH EXTRACTION       OB History    Gravida  5   Para  3   Term  3   Preterm      AB      Living  3     SAB      IAB      Ectopic      Multiple      Live Births              Family History  Problem Relation Age of Onset  . Diabetes Mother   . Cirrhosis Mother   . Irritable bowel syndrome Mother   . Crohn's disease Mother   . Lung cancer Mother   . Heart disease Mother   . Diabetes Father   . Heart disease Father   . Liver disease Father   . Cirrhosis  Father   . Diabetes Brother   . Colon cancer Paternal Grandfather 10  . Colon cancer Other   . Rectal cancer Other   . Esophageal cancer Neg Hx   . Stomach cancer Neg Hx     Social History   Tobacco Use  . Smoking status: Former Smoker    Packs/day: 1.00    Years: 30.00    Pack years: 30.00    Types: Cigarettes    Quit date: 01/31/2020    Years since quitting: 0.8  . Smokeless tobacco: Never Used  Vaping Use  . Vaping Use: Every day  . Substances: Nicotine, CBD, Flavoring  Substance Use Topics  . Alcohol use: Yes    Alcohol/week: 0.0 standard drinks    Comment: occasional  . Drug use: No    Home Medications Prior to Admission medications   Medication Sig Start Date End Date Taking? Authorizing Provider  cetirizine (ZYRTEC) 10 MG tablet Take 10 mg by mouth at bedtime.   Yes [provider]  HYDROcodone-acetaminophen (NORCO/VICODIN) 5-325 MG tablet Take 1 tablet by mouth every 6 (six) hours as needed for severe pain. 11/21/20  Yes Meisinger, Sherren Mocha, MD  ibuprofen (ADVIL) 200 MG tablet Take 600-800 mg by mouth every 6 (six) hours as needed for mild pain.   Yes [provider]  naproxen sodium (ALEVE) 220 MG tablet Take 440-880 mg by mouth 2 (two) times daily as needed (pain).   Yes [provider]  omeprazole (PRILOSEC) 20 MG capsule Take 20 mg by mouth at bedtime.   Yes [provider]    Allergies    Patient has no known allergies.  Review of Systems   Review of Systems  Constitutional: Negative for fever.  Gastrointestinal: Positive for abdominal pain and vomiting.       Acid reflux  Genitourinary: Positive for vaginal bleeding. Negative for dysuria.  Neurological: Negative for syncope.  All other systems reviewed and are negative.   Physical Exam Updated Vital Signs BP 133/89   Pulse 83   Temp 98.3 F (36.8 C)   Resp (!) 23   Ht 1.575 m (5' 2" )   Wt 108.9 kg   SpO2 97%   BMI 43.90 kg/m   Physical Exam CONSTITUTIONAL:  Well developed/well nourished HEAD: Normocephalic/atraumatic EYES: EOMI/PERRL, conjunctiva pink ENMT: Mucous membranes moist NECK: supple no meningeal signs SPINE/BACK:entire spine nontender CV: S1/S2 noted, no murmurs/rubs/gallops noted LUNGS: Lungs are clear to auscultation bilaterally, no apparent distress ABDOMEN: soft, nontender, no rebound or guarding, bowel sounds noted throughout  abdomen GU:no cva tenderness, female nurse chaperone present for exam.  Patient with obvious vaginal bleeding.  No obvious lacerations. NEURO: Pt is awake/alert/appropriate, moves all extremitiesx4.  No facial droop.   EXTREMITIES: pulses normal/equal, full ROM SKIN: warm, color normal PSYCH: no abnormalities of mood noted, alert and oriented to situation  ED Results / Procedures / Treatments   Labs (all labs ordered are listed, but only abnormal results are displayed) Labs Reviewed  CBC - Abnormal; Notable for the following components:      Result Value   Platelets 134 (*)    All other components within normal limits  BASIC METABOLIC PANEL - Abnormal; Notable for the following components:   Glucose, Bld 143 (*)    All other components within normal limits  RESP PANEL BY RT-PCR (FLU A&B, COVID) ARPGX2  I-STAT BETA HCG BLOOD, ED ([facility], [facility], [facility] ONLY)  TYPE AND SCREEN    EKG None  Radiology No results found.  Procedures Procedures   Medications Ordered in ED Medications - No data to display  ED Course  I have reviewed the triage vital signs and the nursing notes.  Pertinent labs  results that were available during my care of the patient were reviewed by me and considered in my medical decision making (see chart for details).    MDM Rules/Calculators/A&P                          2:45 AM Call was placed to on-call OB/GYN for [facility] 2:55 AM D/w Dr Wilhelmenia Blase with OBGYN who will see patient 4:14 AM Seen by Dr Wilhelmenia Blase Plan to take to OR for definitive management  Final  Clinical Impression(s) / ED Diagnoses Final diagnoses:  Vaginal bleeding  Postoperative vaginal bleeding following genitourinary procedure    Rx / DC Orders ED Discharge Orders    None       Ripley Fraise, MD 12/12/20 [phone]

## 2020-12-12 NOTE — Transfer of Care (Signed)
Immediate Anesthesia Transfer of Care Note  Patient: Meghan Wilcox  Procedure(s) Performed: Procedure(s): CERVICAL LACERATION REPAIR (N/A)  Patient Location: PACU  Anesthesia Type:General  Level of Consciousness: Alert, Awake, Oriented  Airway & Oxygen Therapy: Patient Spontanous Breathing  Post-op Assessment: Report given to RN  Post vital signs: Reviewed and stable  Last Vitals:  Vitals:   12/12/20 0400 12/12/20 0415  BP: (!) 132/113   Pulse:    Resp: 20   Temp:    SpO2:  16%    Complications: No apparent anesthesia complications

## 2020-12-12 NOTE — ED Notes (Signed)
Patient was seen by OB. Plan is for patient to go to the OR. Consent is signed.

## 2020-12-12 NOTE — ED Notes (Signed)
Patients pad was changed. Patient has large clots and vaginal bleeding.

## 2020-12-12 NOTE — Anesthesia Procedure Notes (Signed)
Procedure Name: Intubation Date/Time: 12/12/2020 5:00 AM Performed by: Gerald Leitz, CRNA Pre-anesthesia Checklist: Patient identified, Patient being monitored, Timeout performed, Emergency Drugs available and Suction available Patient Re-evaluated:Patient Re-evaluated prior to induction Oxygen Delivery Method: Circle system utilized Preoxygenation: Pre-oxygenation with 100% oxygen Induction Type: IV induction and Rapid sequence Ventilation: Mask ventilation without difficulty Laryngoscope Size: Mac and 3 Grade View: Grade I Tube type: Oral Tube size: 7.0 mm Number of attempts: 1 Placement Confirmation: ETT inserted through vocal cords under direct vision,  positive ETCO2 and breath sounds checked- equal and bilateral Secured at: 21 cm Tube secured with: Tape Dental Injury: Teeth and Oropharynx as per pre-operative assessment

## 2020-12-12 NOTE — ED Notes (Signed)
Patient's husband at bedside

## 2020-12-12 NOTE — Discharge Instructions (Signed)

## 2020-12-15 ENCOUNTER — Encounter (HOSPITAL_COMMUNITY): Payer: Self-pay | Admitting: Obstetrics and Gynecology

## 2020-12-22 NOTE — Anesthesia Postprocedure Evaluation (Signed)
Anesthesia Post Note  Patient: Meghan Wilcox  Procedure(s) Performed: CERVICAL LACERATION REPAIR (N/A Vagina )     Patient location during evaluation: PACU Anesthesia Type: General Level of consciousness: awake and alert Pain management: pain level controlled Vital Signs Assessment: post-procedure vital signs reviewed and stable Respiratory status: spontaneous breathing, nonlabored ventilation, respiratory function stable and patient connected to nasal cannula oxygen Cardiovascular status: blood pressure returned to baseline and stable Postop Assessment: no apparent nausea or vomiting Anesthetic complications: no   No complications documented.  Last Vitals:  Vitals:   12/12/20 0641 12/12/20 0651  BP: 115/77 118/69  Pulse: 81 82  Resp: 20 20  Temp:  36.7 C  SpO2: 93% 94%    Last Pain:  Vitals:   12/12/20 0641  PainSc: 4                  Zariah Jost S

## 2021-03-09 DIAGNOSIS — L821 Other seborrheic keratosis: Secondary | ICD-10-CM | POA: Diagnosis not present

## 2021-03-09 DIAGNOSIS — L249 Irritant contact dermatitis, unspecified cause: Secondary | ICD-10-CM | POA: Diagnosis not present

## 2021-03-09 DIAGNOSIS — L718 Other rosacea: Secondary | ICD-10-CM | POA: Diagnosis not present

## 2021-03-09 DIAGNOSIS — L218 Other seborrheic dermatitis: Secondary | ICD-10-CM | POA: Diagnosis not present

## 2021-03-09 DIAGNOSIS — B079 Viral wart, unspecified: Secondary | ICD-10-CM | POA: Diagnosis not present

## 2021-03-09 DIAGNOSIS — D485 Neoplasm of uncertain behavior of skin: Secondary | ICD-10-CM | POA: Diagnosis not present

## 2021-05-07 DIAGNOSIS — B300 Keratoconjunctivitis due to adenovirus: Secondary | ICD-10-CM | POA: Diagnosis not present

## 2021-08-18 ENCOUNTER — Ambulatory Visit
Admission: RE | Admit: 2021-08-18 | Discharge: 2021-08-18 | Disposition: A | Discharge status: Home / Self Care | Payer: BC Managed Care – PPO | Source: Ambulatory Visit | Attending: Physician Assistant | Admitting: Physician Assistant

## 2021-08-18 ENCOUNTER — Other Ambulatory Visit: Payer: Self-pay

## 2021-08-18 VITALS — BP 148/69 | HR 86 | Temp 98.3°F | Resp 18

## 2021-08-18 DIAGNOSIS — N3000 Acute cystitis without hematuria: Secondary | ICD-10-CM | POA: Diagnosis not present

## 2021-08-18 DIAGNOSIS — M545 Low back pain, unspecified: Secondary | ICD-10-CM | POA: Insufficient documentation

## 2021-08-18 LAB — POCT URINALYSIS DIP (MANUAL ENTRY)
Bilirubin, UA: NEGATIVE
Glucose, UA: NEGATIVE mg/dL
Ketones, POC UA: NEGATIVE mg/dL
Nitrite, UA: NEGATIVE
Protein Ur, POC: NEGATIVE mg/dL
Spec Grav, UA: 1.025 (ref 1.010–1.025)
Urobilinogen, UA: 0.2 E.U./dL
pH, UA: 5.5 (ref 5.0–8.0)

## 2021-08-18 MED ORDER — CYCLOBENZAPRINE HCL 10 MG PO TABS: 10.0000 mg | ORAL_TABLET | Freq: Two times a day (BID) | ORAL | 0 refills | Status: DC | PRN

## 2021-08-18 MED ORDER — PREDNISONE 20 MG PO TABS: 40.0000 mg | ORAL_TABLET | Freq: Every day | ORAL | 0 refills | Status: AC

## 2021-08-18 MED ORDER — NITROFURANTOIN MONOHYD MACRO 100 MG PO CAPS: 100.0000 mg | ORAL_CAPSULE | Freq: Two times a day (BID) | ORAL | 0 refills | Status: DC

## 2021-08-18 NOTE — ED Provider Notes (Signed)
[facility]    CSN: [id number] Arrival date & time: 08/18/21  0945      History   Chief Complaint Chief Complaint  Patient presents with   Appointment    1000   Back Pain    HPI Meghan Wilcox is a 46 y.o. female.   Patient here today for evaluation of lower back pain that started worsening about 5 days ago.  She states that she does have chronic low back pain but in the last 5 days has had some radiation to her leg and buttock.  She reports that she typically sleeps on her right side has not noticed some numbness in the night at times in her right leg but otherwise denies any numbness or tingling.  She has not had any fever.  She has not reported any nausea or vomiting.  She denies dysuria.  The history is provided by the patient.  Back Pain Associated symptoms: no abdominal pain, no dysuria and no fever    Past Medical History:  Diagnosis Date   Allergy    seasonal   Blood transfusion age 48yr   Antibody scewwn neg. 08/20/10 prior to cholecystectomy   Cancer ([place]   2 spots melanoma removed under left breast   Cervical mass    Chronic back pain    Diverticulitis    Family history of malignant neoplasm of gastrointestinal tract    Fatty liver    Gastroparesis    GERD (gastroesophageal reflux disease)    Hepatomegaly    History of ovarian cyst    Scoliosis    [place]izures ([place]    post partum  eclampsia  22 yrs ago    Patient Active Problem List   Diagnosis Date Noted   GAD (generalized anxiety disorder) 01/16/2019      01/16/2019   Cellulitis and abscess of leg 09/22/2018   Smoker 09/19/2018   Hiatal hernia 06/12/2018   Healthcare maintenance 05/09/2018   BMI 40.0-44.9, adult ([place] 05/09/2018   Plantar fasciitis of right foot 02/11/2015   Deformity of metatarsal bone of right foot 02/11/2015   Equinus deformity of foot, acquired 02/11/2015   Diarrhea 08/22/2014   Colitis, acute 08/21/2014   Leukocytosis 08/21/2014   Chest pain 03/31/2014    Ventral hernia 11/12/2013   SUI (stress urinary incontinence, female) 10/01/2011   Pelvic pain in female 10/01/2011    Past Surgical History:  Procedure Laterality Date   BLADDER SUSPENSION  10/01/2011   Procedure: TRANSVAGINAL TAPE (TVT) PROCEDURE;  Surgeon: TClarene Duke MD;  [facility];  Service: Gynecology;  Laterality: N/A;  Solyx Sling   CERVICAL POLYPECTOMY N/A 11/21/2020   Procedure: CERVICAL POLYPECTOMY/REMOVAL OF MASS;  Surgeon: MCheri Fowler MD;  [facility]  Service: Gynecology;  Laterality: N/A;   CHOLECYSTECTOMY  jan 2012   Dr ARonnald Collum  CYSTOSCOPY  10/01/2011   Procedure: CYSTOSCOPY;  Surgeon: TClarene Duke MD;  [facility];  Service: Gynecology;  Laterality: N/A;   INNER EAR SURGERY Left age 46  KNEE SURGERY Left age 46  arthroscopic   LAPAROSCOPY  10/01/2011   Procedure: LAPAROSCOPY OPERATIVE;  Surgeon: TClarene Duke MD;  [facility];  Service: Gynecology;  Laterality: N/A;  removal of right fimbria, fulgeration of endometriosis   PERINEAL LACERATION REPAIR N/A 12/12/2020   Procedure: CERVICAL LACERATION REPAIR;  Surgeon: SDeliah Boston MD;  [facility];  Service: Gynecology;  Laterality: N/A;   Steel rod  in her back  age 20   scoliosis   TONSILLECTOMY  as child   TUBAL LIGATION  april 31 2007   uterine ablation  2008   WISDOM TOOTH EXTRACTION      OB History     Gravida  5   Para  3   Term  3   Preterm      AB      Living  3      SAB      IAB      Ectopic      Multiple      Live Births               Home Medications    Prior to Admission medications   Medication Sig Start Date End Date Taking? Authorizing Provider  cyclobenzaprine (FLEXERIL) 10 MG tablet Take 1 tablet (10 mg total) by mouth 2 (two) times daily as needed for muscle spasms. 08/18/21  Yes Francene Finders, PA-C  nitrofurantoin, macrocrystal-monohydrate, (MACROBID) 100 MG capsule Take 1 capsule (100 mg  total) by mouth 2 (two) times daily. 08/18/21  Yes Francene Finders, PA-C  predniSONE (DELTASONE) 20 MG tablet Take 2 tablets (40 mg total) by mouth daily with breakfast for 5 days. 08/18/21 08/23/21 Yes Francene Finders, PA-C  cetirizine (ZYRTEC) 10 MG tablet Take 10 mg by mouth at bedtime.    [provider]  HYDROcodone-acetaminophen (NORCO/VICODIN) 5-325 MG tablet Take 1 tablet by mouth every 6 (six) hours as needed for severe pain. Patient not taking: Reported on 08/18/2021 12/12/20   Shivaji, Melida Quitter, MD  ibuprofen (ADVIL) 200 MG tablet Take 600-800 mg by mouth every 6 (six) hours as needed for mild pain.    [provider]  naproxen sodium (ALEVE) 220 MG tablet Take 440-880 mg by mouth 2 (two) times daily as needed (pain).    [provider]  omeprazole (PRILOSEC) 20 MG capsule Take 20 mg by mouth at bedtime.    [provider]    Family History Family History  Problem Relation Age of Onset   Diabetes Mother    Cirrhosis Mother    Irritable bowel syndrome Mother    Crohn's disease Mother    Lung cancer Mother    Heart disease Mother    Diabetes Father    Heart disease Father    Liver disease Father    Cirrhosis Father    Diabetes Brother    Colon cancer Paternal Grandfather 38   Colon cancer Other    Rectal cancer Other    Esophageal cancer Neg Hx    Stomach cancer Neg Hx     Social History Social History   Tobacco Use   Smoking status: Former    Packs/day: 1.00    Years: 30.00    Pack years: 30.00    Types: Cigarettes    Quit date: 01/31/2020    Years since quitting: 1.5   Smokeless tobacco: Never  Vaping Use   Vaping Use: Every day   Substances: Nicotine, CBD, Flavoring  Substance Use Topics   Alcohol use: Yes    Alcohol/week: 0.0 standard drinks    Comment: occasional   Drug use: No     Allergies   Patient has no known allergies.   Review of Systems Review of Systems  Constitutional:  Negative for chills and fever.   Eyes:  Negative for discharge and redness.  Gastrointestinal:  Negative for abdominal pain, nausea and vomiting.  Genitourinary:  Negative for dysuria.  Musculoskeletal:  Positive for back pain.    Physical Exam Triage Vital Signs ED Triage Vitals [08/18/21 1006]  Enc Vitals Group     BP (!) 148/69     Pulse Rate 86     Resp 18     Temp 98.3 F (36.8 C)     Temp Source Oral     SpO2 95 %     Weight      Height      Head Circumference      Peak Flow      Pain Score 8     Pain Loc      Pain Edu?      Excl. in [place]?    No data found.  Updated Vital Signs BP (!) 148/69 (BP Location: Left Arm)    Pulse 86    Temp 98.3 F (36.8 C) (Oral)    Resp 18    SpO2 95%   Physical Exam Vitals and nursing note reviewed.  Constitutional:      General: She is not in acute distress.    Appearance: Normal appearance. She is not ill-appearing.  HENT:     Head: Normocephalic and atraumatic.  Eyes:     Conjunctiva/sclera: Conjunctivae normal.  Cardiovascular:     Rate and Rhythm: Normal rate and regular rhythm.     Heart sounds: Normal heart sounds. No murmur heard. Pulmonary:     Effort: Pulmonary effort is normal. No respiratory distress.     Breath sounds: Normal breath sounds. No wheezing, rhonchi or rales.  Musculoskeletal:     Comments: No TTP to midline spine, some  mild TTP to right low back  Neurological:     Mental Status: She is alert.  Psychiatric:        Mood and Affect: Mood normal.        Behavior: Behavior normal.        Thought Content: Thought content normal.     UC Treatments / Results  Labs (all labs ordered are listed, but only abnormal results are displayed) Labs Reviewed  POCT URINALYSIS DIP (MANUAL ENTRY) - Abnormal; Notable for the following components:      Result Value   Blood, UA trace-intact (*)    Leukocytes, UA Moderate (2+) (*)    All other components within normal limits  URINE CULTURE    EKG   Radiology No results  found.  Procedures Procedures (including critical care time)  Medications Ordered in UC Medications - No data to display  Initial Impression / Assessment and Plan / UC Course  I have reviewed the triage vital signs and the nursing notes.  Pertinent labs & imaging results that were available during my care of the patient were reviewed by me and considered in my medical decision making (see chart for details).    UA with positive leukocyte esterase-- will treat to cover UTI with macrobid and urine culture ordered. Will treat to cover possible sciatica with prednisone and muscle relaxer. Advised muscle relaxer may cause drowsiness. Encouraged follow up if symptoms do not improve or worsen.   Final Clinical Impressions(s) / UC Diagnoses   Final diagnoses:  Acute cystitis without hematuria  Acute low back pain, unspecified back pain laterality, unspecified whether sciatica present   Discharge Instructions   None    ED Prescriptions     Medication Sig Dispense Auth. Provider   predniSONE (DELTASONE) 20 MG tablet Take 2 tablets (40 mg total) by mouth daily  with breakfast for 5 days. 10 tablet Ewell Poe F, PA-C   cyclobenzaprine (FLEXERIL) 10 MG tablet Take 1 tablet (10 mg total) by mouth 2 (two) times daily as needed for muscle spasms. 20 tablet Ewell Poe F, PA-C   nitrofurantoin, macrocrystal-monohydrate, (MACROBID) 100 MG capsule Take 1 capsule (100 mg total) by mouth 2 (two) times daily. 10 capsule Francene Finders, PA-C      PDMP not reviewed this encounter.   Francene Finders, PA-C 08/18/21 1309

## 2021-08-18 NOTE — ED Triage Notes (Signed)
Pt here for lower back pain chronic in nature but worse over last 5 days; denies injury but sts some radiation to legs and buttocks

## 2021-08-19 LAB — URINE CULTURE: Culture: NO GROWTH

## 2021-09-18 ENCOUNTER — Other Ambulatory Visit: Payer: Self-pay

## 2021-09-18 ENCOUNTER — Ambulatory Visit
Admission: EM | Admit: 2021-09-18 | Discharge: 2021-09-18 | Disposition: A | Discharge status: Home / Self Care | Payer: BC Managed Care – PPO | Attending: Physician Assistant | Admitting: Physician Assistant

## 2021-09-18 DIAGNOSIS — L600 Ingrowing nail: Secondary | ICD-10-CM | POA: Diagnosis not present

## 2021-09-18 MED ORDER — DOXYCYCLINE HYCLATE 100 MG PO CAPS: 100.0000 mg | ORAL_CAPSULE | Freq: Two times a day (BID) | ORAL | 0 refills | Status: DC

## 2021-09-18 NOTE — ED Provider Notes (Signed)
[facility]    CSN: [id number] Arrival date & time: 09/18/21  1933      History   Chief Complaint Chief Complaint  Patient presents with   Ingrown Toenail    HPI Meghan Wilcox is a 46 y.o. female.   Patient here today for evaluation of infected ingrown toenail to her left foot that has been present for the last several weeks.  She notes that her great toe has had increased pain and drainage.  She has been trying to soak her nail without significant relief.  The history is provided by the patient.   Past Medical History:  Diagnosis Date   Allergy    seasonal   Blood transfusion age 73yr   Antibody scewwn neg. 08/20/10 prior to cholecystectomy   Cancer ([facility] 2018   2 spots melanoma removed under left breast   Cervical mass    Chronic back pain    Diverticulitis    Family history of malignant neoplasm of gastrointestinal tract    Fatty liver    Gastroparesis    GERD (gastroesophageal reflux disease)    Hepatomegaly    History of ovarian cyst    Scoliosis    Seizures ([facility]    post partum  eclampsia  22 yrs ago    Patient Active Problem List   Diagnosis Date Noted   GAD (generalized anxiety disorder) 01/16/2019      01/16/2019   Cellulitis and abscess of leg 09/22/2018   Smoker 09/19/2018   Hiatal hernia 06/12/2018   Healthcare maintenance 05/09/2018   BMI 40.0-44.9, [place] 05/09/2018   Plantar fasciitis of right foot 02/11/2015   Deformity of metatarsal bone of right foot 02/11/2015   Equinus deformity of foot, acquired 02/11/2015   Diarrhea 08/22/2014   Colitis, acute 08/21/2014   Leukocytosis 08/21/2014   Chest pain 03/31/2014   Ventral hernia 11/12/2013   SUI (stress urinary incontinence, female) 10/01/2011   Pelvic pain in female 10/01/2011    Past Surgical History:  Procedure Laterality Date   BLADDER SUSPENSION  10/01/2011   Procedure: TRANSVAGINAL TAPE (TVT) PROCEDURE;  Surgeon: TClarene Duke MD;  [facility];   Service: Gynecology;  Laterality: N/A;  Solyx Sling   CERVICAL POLYPECTOMY N/A 11/21/2020   Procedure: CERVICAL POLYPECTOMY/REMOVAL OF MASS;  Surgeon: MCheri Fowler MD;  [place] [facility]  Service: Gynecology;  Laterality: N/A;   CHOLECYSTECTOMY  jan 2012   Dr ARonnald Collum  CYSTOSCOPY  10/01/2011   Procedure: CYSTOSCOPY;  Surgeon: TClarene Duke MD;  [facility];  Service: Gynecology;  Laterality: N/A;   INNER EAR SURGERY Left age 46  KNEE SURGERY Left age 46  arthroscopic   LAPAROSCOPY  10/01/2011   Procedure: LAPAROSCOPY OPERATIVE;  Surgeon: TClarene Duke MD;  [facility];  Service: Gynecology;  Laterality: N/A;  removal of right fimbria, fulgeration of endometriosis   PERINEAL LACERATION REPAIR N/A 12/12/2020   Procedure: CERVICAL LACERATION REPAIR;  Surgeon: SDeliah Boston MD;  [facility];  Service: Gynecology;  Laterality: N/A;   Steel rod in her back  age 46  scoliosis   TONSILLECTOMY  as child   TUBAL LIGATION  april 31 2007   uterine ablation  2008   WISDOM TOOTH EXTRACTION      OB History     Gravida  5   Para  3   Term  3   Preterm      AB  Living  3      SAB      IAB      Ectopic      Multiple      Live Births               Home Medications    Prior to Admission medications   Medication Sig Start Date End Date Taking? Authorizing Provider  doxycycline (VIBRAMYCIN) 100 MG capsule Take 1 capsule (100 mg total) by mouth 2 (two) times daily. 09/18/21  Yes Francene Finders, PA-C  cetirizine (ZYRTEC) 10 MG tablet Take 10 mg by mouth at bedtime.    [provider]  cyclobenzaprine (FLEXERIL) 10 MG tablet Take 1 tablet (10 mg total) by mouth 2 (two) times daily as needed for muscle spasms. 08/18/21   Francene Finders, PA-C  HYDROcodone-acetaminophen (NORCO/VICODIN) 5-325 MG tablet Take 1 tablet by mouth every 6 (six) hours as needed for severe pain. Patient not taking: Reported on 08/18/2021  12/12/20   Shivaji, Melida Quitter, MD  ibuprofen (ADVIL) 200 MG tablet Take 600-800 mg by mouth every 6 (six) hours as needed for mild pain.    [provider]  naproxen sodium (ALEVE) 220 MG tablet Take 440-880 mg by mouth 2 (two) times daily as needed (pain).    [provider]  nitrofurantoin, macrocrystal-monohydrate, (MACROBID) 100 MG capsule Take 1 capsule (100 mg total) by mouth 2 (two) times daily. 08/18/21   Francene Finders, PA-C  omeprazole (PRILOSEC) 20 MG capsule Take 20 mg by mouth at bedtime.    [provider]    Family History Family History  Problem Relation Age of Onset   Diabetes Mother    Cirrhosis Mother    Irritable bowel syndrome Mother    Crohn's disease Mother    Lung cancer Mother    Heart disease Mother    Diabetes Father    Heart disease Father    Liver disease Father    Cirrhosis Father    Diabetes Brother    Colon cancer Paternal Grandfather 54   Colon cancer Other    Rectal cancer Other    Esophageal cancer Neg Hx    Stomach cancer Neg Hx     Social History Social History   Tobacco Use   Smoking status: Former    Packs/day: 1.00    Years: 30.00    Pack years: 30.00    Types: Cigarettes    Quit date: 01/31/2020    Years since quitting: 1.6   Smokeless tobacco: Never  Vaping Use   Vaping Use: Every day   Substances: Nicotine, CBD, Flavoring  Substance Use Topics   Alcohol use: Yes    Alcohol/week: 0.0 standard drinks    Comment: occasional   Drug use: No     Allergies   Patient has no known allergies.   Review of Systems Review of Systems  Constitutional:  Negative for chills and fever.  Eyes:  Negative for discharge and redness.  Gastrointestinal:  Negative for abdominal pain, nausea and vomiting.  Skin:  Positive for color change. Negative for wound.    Physical Exam Triage Vital Signs ED Triage Vitals [09/18/21 1953]  Enc Vitals Group     BP 131/84     Pulse Rate 80     Resp 18     Temp (!) 97.4  F (36.3 C)     Temp Source Oral     SpO2 96 %     Weight  Height      Head Circumference      Peak Flow      Pain Score 0     Pain Loc      Pain Edu?      Excl. in [place]?    No data found.  Updated Vital Signs BP 131/84 (BP Location: Left Arm)    Pulse 80    Temp (!) 97.4 F (36.3 C) (Oral)    Resp 18    SpO2 96%      Physical Exam Vitals and nursing note reviewed.  Constitutional:      General: She is not in acute distress.    Appearance: Normal appearance. She is not ill-appearing.  HENT:     Head: Normocephalic and atraumatic.  Eyes:     Conjunctiva/sclera: Conjunctivae normal.  Cardiovascular:     Rate and Rhythm: Normal rate.  Pulmonary:     Effort: Pulmonary effort is normal.  Skin:    Comments: Diffuse swelling to left great toe with minimal drainage from lateral nail area  Neurological:     Mental Status: She is alert.  Psychiatric:        Mood and Affect: Mood normal.        Behavior: Behavior normal.        Thought Content: Thought content normal.     UC Treatments / Results  Labs (all labs ordered are listed, but only abnormal results are displayed) Labs Reviewed - No data to display  EKG   Radiology No results found.  Procedures Procedures (including critical care time)  Medications Ordered in UC Medications - No data to display  Initial Impression / Assessment and Plan / UC Course  I have reviewed the triage vital signs and the nursing notes.  Pertinent labs & imaging results that were available during my care of the patient were reviewed by me and considered in my medical decision making (see chart for details).  Patient has appointment with her foot doctor next week.  We will treat with doxycycline while awaiting that appointment and recommended continued soaks.  Encouraged follow-up with any further concerns.  Final Clinical Impressions(s) / UC Diagnoses   Final diagnoses:  Ingrown toenail with infection   Discharge  Instructions   None    ED Prescriptions     Medication Sig Dispense Auth. Provider   doxycycline (VIBRAMYCIN) 100 MG capsule Take 1 capsule (100 mg total) by mouth 2 (two) times daily. 20 capsule Francene Finders, PA-C      PDMP not reviewed this encounter.   Francene Finders, PA-C 09/19/21 [id number]

## 2021-09-24 ENCOUNTER — Other Ambulatory Visit: Payer: Self-pay

## 2021-09-24 ENCOUNTER — Encounter: Payer: Self-pay | Admitting: Podiatry

## 2021-09-24 ENCOUNTER — Ambulatory Visit: Payer: BC Managed Care – PPO | Admitting: Podiatry

## 2021-09-24 DIAGNOSIS — L600 Ingrowing nail: Secondary | ICD-10-CM

## 2021-09-24 NOTE — Patient Instructions (Signed)

## 2021-09-25 NOTE — Progress Notes (Signed)
Subjective:   Patient ID: Meghan Wilcox, female   DOB: 46 y.o.   MRN: [id number]   HPI Patient presents with a significant ingrown toenail left big toe stating its been very sore and she is tried to trim it and soak it herself and has been present for a short period of time.  Patient has had pedicures in the past that its not been effective for this particular problem and does not smoke likes to be active   Review of Systems  All other systems reviewed and are negative.      Objective:  Physical Exam Vitals and nursing note reviewed.  Constitutional:      Appearance: She is well-developed.  Pulmonary:     Effort: Pulmonary effort is normal.  Musculoskeletal:        General: Normal range of motion.  Skin:    General: Skin is warm.  Neurological:     Mental Status: She is alert.    Neurovascular status found to be intact muscle strength found to be adequate range of motion adequate with patient having had previous [place] fasciitis and other deformity that is under good control.  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  I infiltrated the hallux 60 mg like Marcaine mixture sterile prep done and using sterile instrumentation remove the border exposed matrix applied phenol 3 applications 30 seconds followed by alcohol lavage sterile dressing gave instructions on soaks and to leave dressing on 24 hours but take it off earlier if any throbbing were to occur.  Patient will be seen back as needed encouraged to call questions concerns which may arise

## 2021-11-02 DIAGNOSIS — Z130 Encounter for screening for diseases of the blood and blood-forming organs and certain disorders involving the immune mechanism: Secondary | ICD-10-CM | POA: Diagnosis not present

## 2021-11-02 DIAGNOSIS — R82998 Other abnormal findings in urine: Secondary | ICD-10-CM | POA: Diagnosis not present

## 2021-11-02 DIAGNOSIS — Z01419 Encounter for gynecological examination (general) (routine) without abnormal findings: Secondary | ICD-10-CM | POA: Diagnosis not present

## 2021-11-02 DIAGNOSIS — Z6841 Body Mass Index (BMI) 40.0 and over, adult: Secondary | ICD-10-CM | POA: Diagnosis not present

## 2021-11-02 DIAGNOSIS — Z1389 Encounter for screening for other disorder: Secondary | ICD-10-CM | POA: Diagnosis not present

## 2021-11-02 DIAGNOSIS — Z1231 Encounter for screening mammogram for malignant neoplasm of breast: Secondary | ICD-10-CM | POA: Diagnosis not present

## 2021-11-27 DIAGNOSIS — R928 Other abnormal and inconclusive findings on diagnostic imaging of breast: Secondary | ICD-10-CM | POA: Diagnosis not present

## 2021-11-27 DIAGNOSIS — R922 Inconclusive mammogram: Secondary | ICD-10-CM | POA: Diagnosis not present

## 2022-01-26 DIAGNOSIS — H04411 Chronic dacryocystitis of right lacrimal passage: Secondary | ICD-10-CM | POA: Diagnosis not present

## 2022-01-26 DIAGNOSIS — H04221 Epiphora due to insufficient drainage, right lacrimal gland: Secondary | ICD-10-CM | POA: Diagnosis not present

## 2022-01-26 DIAGNOSIS — H04553 Acquired stenosis of bilateral nasolacrimal duct: Secondary | ICD-10-CM | POA: Diagnosis not present

## 2022-01-26 DIAGNOSIS — H02413 Mechanical ptosis of bilateral eyelids: Secondary | ICD-10-CM | POA: Diagnosis not present

## 2022-01-26 DIAGNOSIS — H02831 Dermatochalasis of right upper eyelid: Secondary | ICD-10-CM | POA: Diagnosis not present

## 2022-01-26 DIAGNOSIS — H02834 Dermatochalasis of left upper eyelid: Secondary | ICD-10-CM | POA: Diagnosis not present

## 2022-01-26 DIAGNOSIS — H57813 Brow ptosis, bilateral: Secondary | ICD-10-CM | POA: Diagnosis not present

## 2022-04-02 ENCOUNTER — Encounter: Payer: Self-pay | Admitting: Adult Health

## 2022-04-02 ENCOUNTER — Ambulatory Visit: Payer: BC Managed Care – PPO | Admitting: Adult Health

## 2022-04-02 DIAGNOSIS — J302 Other seasonal allergic rhinitis: Secondary | ICD-10-CM | POA: Diagnosis not present

## 2022-04-02 DIAGNOSIS — K219 Gastro-esophageal reflux disease without esophagitis: Secondary | ICD-10-CM

## 2022-04-02 DIAGNOSIS — Z0000 Encounter for general adult medical examination without abnormal findings: Secondary | ICD-10-CM | POA: Diagnosis not present

## 2022-04-02 MED ORDER — PANTOPRAZOLE SODIUM 40 MG PO TBEC: 40.0000 mg | DELAYED_RELEASE_TABLET | Freq: Every day | ORAL | 3 refills | Status: DC

## 2022-04-02 NOTE — Progress Notes (Signed)
[facility] clinic  Provider:   Durenda Age DNP  Code Status:  Full Code  Goals of Care:     04/02/2022   12:52 PM  Advanced Directives  Does Patient Have a Medical Advance Directive? No  Would patient like information on creating a medical advance directive? No - Patient declined     Chief Complaint  Patient presents with   Establish Care    New patient.     HPI: Patient is a 46 y.o. female seen today to establish care with [facility]. She has a PMH of fatty liver, GERD, chronic back pain, diverticulitis and gastroparesis. She is G5P3. 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 She lives with her husband and youngest daughter who is 31 years old. Her 3 children are healthy. She is a social drinker, drinks once or twice a month. Both her parents had DM and CHF. Mother died from lung cancer and father died of liver disease. She had colonoscopy 2 years ago, [facility]. She takes laxative twice a week. She doesn't want to eat vegetable but likes some fruits. She is thinking of juicing. She said that she gets easily full and does not finish all her meals. She does not like to have COVID-19 vaccine. She complains about acid reflux and takes Omeprazole for years now. She has not seen a PCP in 4 years.   Past Medical History:  Diagnosis Date   Allergy    seasonal   Blood transfusion age 60yr   Antibody scewwn neg. 08/20/10 prior to cholecystectomy   Cancer ([facility] 2018   2 spots melanoma removed under left breast   Cervical mass    Chronic back pain    Diverticulitis    Family history of malignant neoplasm of gastrointestinal tract    Fatty liver    Gastroparesis    GERD (gastroesophageal reflux disease)    Hepatomegaly    History of ovarian cyst    Scoliosis    [place]izures ([facility]    post partum  eclampsia  22 yrs ago    Past Surgical History:  Procedure Laterality Date   BLADDER SUSPENSION  10/01/2011   Procedure: TRANSVAGINAL TAPE (TVT) PROCEDURE;  Surgeon: TClarene Duke  MD;  [facility];  Service: Gynecology;  Laterality: N/A;  Solyx Sling   CERVICAL POLYPECTOMY N/A 11/21/2020   Procedure: CERVICAL POLYPECTOMY/REMOVAL OF MASS;  Surgeon: MCheri Fowler MD;  [place] [facility]  Service: Gynecology;  Laterality: N/A;   CHOLECYSTECTOMY  jan 2012   Dr ARonnald Collum  CYSTOSCOPY  10/01/2011   Procedure: CYSTOSCOPY;  Surgeon: TClarene Duke MD;  [facility];  Service: Gynecology;  Laterality: N/A;   INNER EAR SURGERY Left age 46  KNEE SURGERY Left age 89  arthroscopic   LAPAROSCOPY  10/01/2011   Procedure: LAPAROSCOPY OPERATIVE;  Surgeon: TClarene Duke MD;  [facility];  Service: Gynecology;  Laterality: N/A;  removal of right fimbria, fulgeration of endometriosis   PERINEAL LACERATION REPAIR N/A 12/12/2020   Procedure: CERVICAL LACERATION REPAIR;  Surgeon: SDeliah Boston MD;  [facility];  Service: Gynecology;  Laterality: N/A;   Steel rod in her back  age 89  scoliosis   TONSILLECTOMY  as child   TUBAL LIGATION  april 31 2007   uterine ablation  2008   WISDOM TOOTH EXTRACTION      No Known Allergies  Outpatient Encounter Medications as of 04/02/2022  Medication Sig  cetirizine (ZYRTEC) 10 MG tablet Take 10 mg by mouth at bedtime.   naproxen sodium (ALEVE) 220 MG tablet Take 440-880 mg by mouth 2 (two) times daily as needed (pain).   NON FORMULARY Take 1 Piece by mouth as needed. 1 CBD gummy for sleep as needed.   omeprazole (PRILOSEC) 20 MG capsule Take 20 mg by mouth at bedtime.   [DISCONTINUED] amoxicillin-clavulanate (AUGMENTIN) 875-125 MG tablet SMARTSIG:1 Tablet(s) By Mouth Every 12 Hours   [DISCONTINUED] cyclobenzaprine (FLEXERIL) 10 MG tablet Take 1 tablet (10 mg total) by mouth 2 (two) times daily as needed for muscle spasms.   [DISCONTINUED] doxycycline (VIBRAMYCIN) 100 MG capsule Take 1 capsule (100 mg total) by mouth 2 (two) times daily.   [DISCONTINUED] HYDROcodone-acetaminophen (NORCO/VICODIN)  5-325 MG tablet Take 1 tablet by mouth every 6 (six) hours as needed for severe pain. (Patient not taking: Reported on 08/18/2021)   [DISCONTINUED] ibuprofen (ADVIL) 200 MG tablet Take 600-800 mg by mouth every 6 (six) hours as needed for mild pain.   [DISCONTINUED] nitrofurantoin, macrocrystal-monohydrate, (MACROBID) 100 MG capsule Take 1 capsule (100 mg total) by mouth 2 (two) times daily.   [DISCONTINUED] predniSONE (DELTASONE) 10 MG tablet Take 40 mg by mouth every morning.   No facility-administered encounter medications on file as of 04/02/2022.    Review of Systems:  Review of Systems  Constitutional:  Negative for appetite change, chills, fatigue and fever.  HENT:  Negative for congestion, hearing loss, rhinorrhea and sore throat.   Eyes: Negative.   Respiratory:  Negative for cough, shortness of breath and wheezing.   Cardiovascular:  Negative for chest pain, palpitations and leg swelling.  Gastrointestinal:  Negative for abdominal pain, constipation, diarrhea, nausea and vomiting.  Genitourinary:  Negative for dysuria.  Musculoskeletal:  Negative for arthralgias, back pain and myalgias.  Skin:  Negative for color change, rash and wound.  Neurological:  Negative for dizziness, weakness and headaches.  Psychiatric/Behavioral:  Negative for behavioral problems. The patient is not nervous/anxious.     Health Maintenance  Topic Date Due   COVID-19 Vaccine (1) Never done   HIV Screening  Never done   PAP SMEAR-Modifier  Never done   TETANUS/TDAP  09/10/2019   MAMMOGRAM  12/14/2019   INFLUENZA VACCINE  03/02/2022   COLONOSCOPY (Pts 45-1yr Insurance coverage will need to be confirmed)  09/12/2023   Hepatitis C Screening  Completed   HPV VACCINES  Aged Out    Physical Exam: Vitals:   04/02/22 1241  BP: 114/80  Pulse: 90  Resp: 18  Temp: 98 F (36.7 C)  SpO2: 95%  Weight: 253 lb 6.4 oz (114.9 kg)  Height: _0  (1.575 m)   Body mass index is 46.35 kg/m. Physical  Exam Constitutional:      General: She is not in acute distress.    Appearance: She is obese.     Comments: Morbidly obese.   HENT:     Head: Normocephalic and atraumatic.     Nose: Nose normal.     Mouth/Throat:     Mouth: Mucous membranes are moist.  Eyes:     Conjunctiva/sclera: Conjunctivae normal.  Cardiovascular:     Rate and Rhythm: Normal rate and regular rhythm.  Pulmonary:     Effort: Pulmonary effort is normal.     Breath sounds: Normal breath sounds.  Abdominal:     General: Bowel sounds are normal.     Palpations: Abdomen is soft.  Musculoskeletal:  General: Normal range of motion.     Cervical back: Normal range of motion.  Skin:    General: Skin is warm and dry.  Neurological:     General: No focal deficit present.     Mental Status: She is alert and oriented to person, place, and time.  Psychiatric:        Mood and Affect: Mood normal.        Behavior: Behavior normal.        Thought Content: Thought content normal.        Judgment: Judgment normal.     Labs reviewed: Basic Metabolic Panel: No results for input(s): "NA", "K", "CL", "CO2", "GLUCOSE", "BUN", "CREATININE", "CALCIUM", "MG", "PHOS", "TSH" in the last 8760 hours. Liver Function Tests: No results for input(s): "AST", "ALT", "ALKPHOS", "BILITOT", "PROT", "ALBUMIN" in the last 8760 hours. No results for input(s): "LIPASE", "AMYLASE" in the last 8760 hours. No results for input(s): "AMMONIA" in the last 8760 hours. CBC: No results for input(s): "WBC", "NEUTROABS", "HGB", "HCT", "MCV", "PLT" in the last 8760 hours. Lipid Panel: No results for input(s): "CHOL", "HDL", "LDLCALC", "TRIG", "CHOLHDL", "LDLDIRECT" in the last 8760 hours. Lab Results  Component Value Date   HGBA1C 5.7 (H) 06/07/2018    Procedures since last visit: No results found.  Assessment/Plan  1. Morbidly obese (HCC) Body mass index is 46.35 kg/m. - plans on having weight loss by exercising and juicing  2.  Seasonal allergies -   continue Zyrtec  3. Gastroesophageal reflux disease, unspecified whether esophagitis present -  discontinue Prilosec and start on Protonix - pantoprazole (PROTONIX) 40 MG tablet; Take 1 tablet (40 mg total) by mouth daily.  Dispense: 30 tablet; Refill: 3  4. Routine adult health maintenance - Hep B Surface Antibody; Future - Hepatitis C Antibody; Future - Lipid Panel; Future - CBC with Differential/Platelets; Future - CMP with eGFR([facility]); Future - HIV antibody (with reflex); Future - Hemoglobin A1C; Future     Labs/tests ordered:   Hep B surface antibody, Hepatitis C antibody, Lipid panel, CBC with differentials, CMP with GFR, HIV and hemoglobin A1C  Next appt:  3 months and as needed

## 2022-04-02 NOTE — Patient Instructions (Addendum)
Diverticulitis  Diverticulitis is infection or inflammation of small pouches (diverticula) in the colon that form due to a condition called diverticulosis. Diverticula can trap stool (feces) and bacteria, causing infection and inflammation. Diverticulitis may cause severe stomach pain and diarrhea. It may lead to tissue damage in the colon that causes bleeding or blockage. The diverticula may also burst (rupture) and cause infected stool to enter other areas of the abdomen. What are the causes? This condition is caused by stool becoming trapped in the diverticula, which allows bacteria to grow in the diverticula. This leads to inflammation and infection. What increases the risk? You are more likely to develop this condition if you have diverticulosis. The risk increases if you: Are overweight or obese. Do not get enough exercise. Drink alcohol. Use tobacco products. Eat a diet that has a lot of red meat such as beef, pork, or lamb. Eat a diet that does not include enough fiber. High-fiber foods include fruits, vegetables, beans, nuts, and whole grains. Are over 62 years of age. What are the signs or symptoms? Symptoms of this condition may include: Pain and tenderness in the abdomen. The pain is normally located on the left side of the abdomen, but it may occur in other areas. Fever and chills. Nausea. Vomiting. Cramping. Bloating. Changes in bowel routines. Blood in your stool. How is this diagnosed? This condition is diagnosed based on: Your medical history. A physical exam. Tests to make sure there is nothing else causing your condition. These tests may include: Blood tests. Urine tests. CT scan of the abdomen. How is this treated? Most cases of this condition are mild and can be treated at home. Treatment may include: Taking over-the-counter pain medicines. Following a clear liquid diet. Taking antibiotic medicines by mouth. Resting. More severe cases may need to be  treated at a hospital. Treatment may include: Not eating or drinking. Taking prescription pain medicine. Receiving antibiotic medicines through an IV. Receiving fluids and nutrition through an IV. Surgery. When your condition is under control, your health care provider may recommend that you have a colonoscopy. This is an exam to look at the entire large intestine. During the exam, a lubricated, bendable tube is inserted into the anus and then passed into the rectum, colon, and other parts of the large intestine. A colonoscopy can show how severe your diverticula are and whether something else may be causing your symptoms. Follow these instructions at home: Medicines Take over-the-counter and prescription medicines only as told by your health care provider. These include fiber supplements, probiotics, and stool softeners. If you were prescribed an antibiotic medicine, take it as told by your health care provider. Do not stop taking the antibiotic even if you start to feel better. Ask your health care provider if the medicine prescribed to you requires you to avoid driving or using machinery. Eating and drinking  Follow a full liquid diet or another diet as directed by your health care provider. After your symptoms improve, your health care provider may tell you to change your diet. He or she may recommend that you eat a diet that contains at least 25 grams (25 g) of fiber daily. Fiber makes it easier to pass stool. Healthy sources of fiber include: Berries. One cup contains 4-8 grams of fiber. Beans or lentils. One-half cup contains 5-8 grams of fiber. Green vegetables. One cup contains 4 grams of fiber. Avoid eating red meat. General instructions Do not use any products that contain nicotine or tobacco, such as  cigarettes, e-cigarettes, and chewing tobacco. If you need help quitting, ask your health care provider. Exercise for at least 30 minutes, 3 times each week. You should exercise hard  enough to raise your heart rate and break a sweat. Keep all follow-up visits as told by your health care provider. This is important. You may need to have a colonoscopy. Contact a health care provider if: Your pain does not improve. Your bowel movements do not return to normal. Get help right away if: Your pain gets worse. Your symptoms do not get better with treatment. Your symptoms suddenly get worse. You have a fever. You vomit more than one time. You have stools that are bloody, black, or tarry. Summary Diverticulitis is infection or inflammation of small pouches (diverticula) in the colon that form due to a condition called diverticulosis. Diverticula can trap stool (feces) and bacteria, causing infection and inflammation. You are at higher risk for this condition if you have diverticulosis and you eat a diet that does not include enough fiber. Most cases of this condition are mild and can be treated at home. More severe cases may need to be treated at a hospital. When your condition is under control, your health care provider may recommend that you have an exam called a colonoscopy. This exam can show how severe your diverticula are and whether something else may be causing your symptoms. Keep all follow-up visits as told by your health care provider. This is important. This information is not intended to replace advice given to you by your health care provider. Make sure you discuss any questions you have with your health care provider. Document Revised: 04/30/2019 Document Reviewed: 04/30/2019 Elsevier Patient Education  2023 [employer] Preventive Care 97-49 Years Old, Female Preventive care refers to lifestyle choices and visits with your health care provider that can promote health and wellness. Preventive care visits are also called wellness exams. What can I expect for my preventive care visit? Counseling Your health care provider may ask you questions about your: Medical  history, including: Past medical problems. Family medical history. Pregnancy history. Current health, including: Menstrual cycle. Method of birth control. Emotional well-being. Home life and relationship well-being. Sexual activity and sexual health. Lifestyle, including: Alcohol, nicotine or tobacco, and drug use. Access to firearms. Diet, exercise, and sleep habits. Work and work Statistician. Sunscreen use. Safety issues such as seatbelt and bike helmet use. Physical exam Your health care provider will check your: Height and weight. These may be used to calculate your BMI (body mass index). BMI is a measurement that tells if you are at a healthy weight. Waist circumference. This measures the distance around your waistline. This measurement also tells if you are at a healthy weight and may help predict your risk of certain diseases, such as type 2 diabetes and high blood pressure. Heart rate and blood pressure. Body temperature. Skin for abnormal spots. What immunizations do I need?  Vaccines are usually given at various ages, according to a schedule. Your health care provider will recommend vaccines for you based on your age, medical history, and lifestyle or other factors, such as travel or where you work. What tests do I need? Screening Your health care provider may recommend screening tests for certain conditions. This may include: Lipid and cholesterol levels. Diabetes screening. This is done by checking your blood sugar (glucose) after you have not eaten for a while (fasting). Pelvic exam and Pap test. Hepatitis B test. Hepatitis C test. HIV (human immunodeficiency virus)  test. STI (sexually transmitted infection) testing, if you are at risk. Lung cancer screening. Colorectal cancer screening. Mammogram. Talk with your health care provider about when you should start having regular mammograms. This may depend on whether you have a family history of breast  cancer. BRCA-related cancer screening. This may be done if you have a family history of breast, ovarian, tubal, or peritoneal cancers. Bone density scan. This is done to screen for osteoporosis. Talk with your health care provider about your test results, treatment options, and if necessary, the need for more tests. Follow these instructions at home: Eating and drinking  Eat a diet that includes fresh fruits and vegetables, whole grains, lean protein, and low-fat dairy products. Take vitamin and mineral supplements as recommended by your health care provider. Do not drink alcohol if: Your health care provider tells you not to drink. You are pregnant, may be pregnant, or are planning to become pregnant. If you drink alcohol: Limit how much you have to 0-1 drink a day. Know how much alcohol is in your drink. In the [place] one drink equals one 12 oz bottle of beer (355 mL), one 5 oz glass of wine (148 mL), or one 1 oz glass of hard liquor (44 mL). Lifestyle Brush your teeth every morning and night with fluoride toothpaste. Floss one time each day. Exercise for at least 30 minutes 5 or more days each week. Do not use any products that contain nicotine or tobacco. These products include cigarettes, chewing tobacco, and vaping devices, such as e-cigarettes. If you need help quitting, ask your health care provider. Do not use drugs. If you are sexually active, practice safe sex. Use a condom or other form of protection to prevent STIs. If you do not wish to become pregnant, use a form of birth control. If you plan to become pregnant, see your health care provider for a prepregnancy visit. Take aspirin only as told by your health care provider. Make sure that you understand how much to take and what form to take. Work with your health care provider to find out whether it is safe and beneficial for you to take aspirin daily. Find healthy ways to manage stress, such as: Meditation, yoga, or listening  to music. Journaling. Talking to a trusted person. Spending time with friends and family. Minimize exposure to UV radiation to reduce your risk of skin cancer. Safety Always wear your seat belt while driving or riding in a vehicle. Do not drive: If you have been drinking alcohol. Do not ride with someone who has been drinking. When you are tired or distracted. While texting. If you have been using any mind-altering substances or drugs. Wear a helmet and other protective equipment during sports activities. If you have firearms in your house, make sure you follow all gun safety procedures. Seek help if you have been physically or sexually abused. What's next? Visit your health care provider once a year for an annual wellness visit. Ask your health care provider how often you should have your eyes and teeth checked. Stay up to date on all vaccines. This information is not intended to replace advice given to you by your health care provider. Make sure you discuss any questions you have with your health care provider. Document Revised: 01/14/2021 Document Reviewed: 01/14/2021 Elsevier Patient Education  Early.

## 2022-04-06 ENCOUNTER — Other Ambulatory Visit: Payer: BC Managed Care – PPO

## 2022-04-06 DIAGNOSIS — Z0000 Encounter for general adult medical examination without abnormal findings: Secondary | ICD-10-CM | POA: Diagnosis not present

## 2022-04-06 DIAGNOSIS — Z205 Contact with and (suspected) exposure to viral hepatitis: Secondary | ICD-10-CM | POA: Diagnosis not present

## 2022-04-06 DIAGNOSIS — E7800 Pure hypercholesterolemia, unspecified: Secondary | ICD-10-CM | POA: Diagnosis not present

## 2022-04-06 DIAGNOSIS — R7309 Other abnormal glucose: Secondary | ICD-10-CM | POA: Diagnosis not present

## 2022-04-07 LAB — HEMOGLOBIN A1C
Hgb A1c MFr Bld: 5.4 % of total Hgb (ref <5.7)
Mean Plasma Glucose: 108 mg/dL
eAG (mmol/L): 6 mmol/L

## 2022-04-07 LAB — COMPLETE METABOLIC PANEL WITH GFR
AG Ratio: 1.1 (calc) (ref 1.0–2.5)
ALT: 18 U/L (ref 6–29)
AST: 24 U/L (ref 10–35)
Albumin: 3.8 g/dL (ref 3.6–5.1)
Alkaline phosphatase (APISO): 71 U/L (ref 31–125)
BUN: 11 mg/dL (ref 7–25)
CO2: 26 mmol/L (ref 20–32)
Calcium: 9.4 mg/dL (ref 8.6–10.2)
Chloride: 105 mmol/L (ref 98–110)
Creat: 0.91 mg/dL (ref 0.50–0.99)
Globulin: 3.4 g/dL (calc) (ref 1.9–3.7)
Glucose, Bld: 84 mg/dL (ref 65–99)
Potassium: 4.5 mmol/L (ref 3.5–5.3)
Sodium: 142 mmol/L (ref 135–146)
Total Bilirubin: 0.6 mg/dL (ref 0.2–1.2)
Total Protein: 7.2 g/dL (ref 6.1–8.1)
eGFR: 78 mL/min/{1.73_m2} (ref >=60)

## 2022-04-07 LAB — CBC WITH DIFFERENTIAL/PLATELET
Absolute Monocytes: 244 cells/uL (ref 200–950)
Basophils Absolute: 29 cells/uL (ref 0–200)
Basophils Relative: 0.7 %
Eosinophils Absolute: 143 cells/uL (ref 15–500)
Eosinophils Relative: 3.4 %
HCT: 43.2 % (ref 35.0–45.0)
Hemoglobin: 14.2 g/dL (ref 11.7–15.5)
Lymphs Abs: 1856 cells/uL (ref 850–3900)
MCH: 29.6 pg (ref 27.0–33.0)
MCHC: 32.9 g/dL (ref 32.0–36.0)
MCV: 90 fL (ref 80.0–100.0)
MPV: 10.8 fL (ref 7.5–12.5)
Monocytes Relative: 5.8 %
Neutro Abs: 1928 cells/uL (ref 1500–7800)
Neutrophils Relative %: 45.9 %
Platelets: 107 10*3/uL — ABNORMAL LOW (ref 140–400)
RBC: 4.8 10*6/uL (ref 3.80–5.10)
RDW: 13.3 % (ref 11.0–15.0)
Total Lymphocyte: 44.2 %
WBC: 4.2 10*3/uL (ref 3.8–10.8)

## 2022-04-07 LAB — HEPATITIS C ANTIBODY: Hepatitis C Ab: NONREACTIVE

## 2022-04-07 LAB — LIPID PANEL
Cholesterol: 193 mg/dL (ref <200)
HDL: 51 mg/dL (ref >=50)
LDL Cholesterol (Calc): 116 mg/dL (calc) — ABNORMAL HIGH
Non-HDL Cholesterol (Calc): 142 mg/dL (calc) — ABNORMAL HIGH (ref <130)
Total CHOL/HDL Ratio: 3.8 (calc) (ref <5.0)
Triglycerides: 138 mg/dL (ref <150)

## 2022-04-07 LAB — HEPATITIS B SURFACE ANTIBODY,QUALITATIVE: Hep B S Ab: NONREACTIVE

## 2022-04-07 LAB — HIV ANTIBODY (ROUTINE TESTING W REFLEX): HIV 1&2 Ab, 4th Generation: NONREACTIVE

## 2022-04-09 NOTE — Progress Notes (Signed)
-    not anemic -  platelet low at 107 (normal 140-400), can be repeated in a month or next visit. -  LDL 116, slightly elevated (normal <100), -   Diets should minimize the intake of trans fats, red meat and processed red meats, refined carbohydrates, and sweetened beverage. -  high intakes of vegetables, fruits, nuts, whole grains, lean vegetable or animal protein, and fish. -  weight reduction -  regular exercise -   A1C normal -   HIV , Hep B, hep C negative -  electrolytes, liver enzymes within normal range

## 2022-04-13 ENCOUNTER — Encounter: Payer: Self-pay | Admitting: Adult Health

## 2022-04-13 ENCOUNTER — Telehealth (INDEPENDENT_AMBULATORY_CARE_PROVIDER_SITE_OTHER): Payer: BC Managed Care – PPO | Admitting: Adult Health

## 2022-04-13 ENCOUNTER — Telehealth: Payer: Self-pay | Admitting: *Deleted

## 2022-04-13 DIAGNOSIS — K219 Gastro-esophageal reflux disease without esophagitis: Secondary | ICD-10-CM

## 2022-04-13 DIAGNOSIS — D696 Thrombocytopenia, unspecified: Secondary | ICD-10-CM

## 2022-04-13 DIAGNOSIS — J302 Other seasonal allergic rhinitis: Secondary | ICD-10-CM | POA: Diagnosis not present

## 2022-04-13 DIAGNOSIS — E7800 Pure hypercholesterolemia, unspecified: Secondary | ICD-10-CM

## 2022-04-13 NOTE — Patient Instructions (Addendum)
Allergies, Adult An allergy is a condition in which the body's defense system (immune system) comes in contact with an allergen and reacts to it. An allergen is anything that causes an allergic reaction. Allergens cause the immune system to make proteins for fighting infections (antibodies). These antibodies cause cells to release chemicals called histamines that set off the symptoms of an allergic reaction. Allergies often affect the nasal passages (allergic rhinitis), eyes (allergic conjunctivitis), skin (atopic dermatitis), and stomach. Allergies can be mild, moderate, or severe. They cannot spread from person to person. Allergies can develop at any age and may be outgrown. What are the causes? This condition is caused by allergens. Common allergens include: Outdoor allergens, such as pollen, car fumes, and mold. Indoor allergens, such as dust, smoke, mold, and pet dander. Other allergens, such as foods, medicines, scents, insect bites or stings, and other skin irritants. What increases the risk? You are more likely to develop this condition if you have: Family members with allergies. Family members who have any condition that may be caused by allergens, such as asthma. This may make you more likely to have other allergies. What are the signs or symptoms? Symptoms of this condition depend on the severity of the allergy. Mild to moderate symptoms Runny nose, stuffy nose (nasal congestion), or sneezing. Itchy mouth, ears, or throat. A feeling of mucus dripping down the back of your throat (postnasal drip). Sore throat. Itchy, red, watery, or puffy eyes. Skin rash, or itchy, red, swollen areas of skin (hives). Stomach cramps or bloating. Severe symptoms Severe allergies to food, medicine, or insect bites may cause anaphylaxis, which can be life-threatening. Symptoms include: A red (flushed) face. Wheezing or coughing. Swollen lips, tongue, or mouth. Tight or swollen throat. Chest pain or  tightness, or rapid heartbeat. Trouble breathing or shortness of breath. Pain in the abdomen, vomiting, or diarrhea. Dizziness or fainting. How is this diagnosed? This condition is diagnosed based on your symptoms, your family and medical history, and a physical exam. You may also have tests, including: Skin tests to see how your skin reacts to allergens that may be causing your symptoms. Tests include: Skin prick test. For this test, an allergen is introduced to your body through a small opening in the skin. Intradermal skin test. For this test, a small amount of allergen is injected under the first layer of your skin. Patch test. For this test, a small amount of allergen is placed on your skin. The area is covered and then checked after a few days. Blood tests. A challenge test. For this test, you will eat or breathe in a small amount of allergen to see if you have an allergic reaction. You may also be asked to: Keep a food diary. This is a record of all the foods, drinks, and symptoms you have in a day. Try an elimination diet. To do this: Remove certain foods from your diet. Add those foods back one by one to find out if any foods cause an allergic reaction. How is this treated?     Treatment for allergies depends on your symptoms. Treatment may include: Cold, wet cloths (cold compresses) to soothe itching and swelling. Eye drops or nasal sprays. Nasal irrigation to help clear your mucus or keep the nasal passages moist. A humidifier to add moisture to the air. Skin creams to treat rashes or itching. Oral antihistamines or other medicines to block the reaction or to treat inflammation. Diet changes to remove foods that cause allergies. Being   exposed again and again to tiny amounts of allergens to help you build a defense against it (tolerance). This is called immunotherapy. Examples include: Allergy shot. You receive an injection that contains an allergen. Sublingual immunotherapy.  You take a small dose of allergen under your tongue. Emergency injection for anaphylaxis. You give yourself a shot using a syringe (auto-injector) that contains the amount of medicine you need. Your health care provider will teach you how to give yourself an injection. Follow these instructions at home: Medicines  Take or apply over-the-counter and prescription medicines only as told by your health care provider. Always carry your auto-injector pen if you are at risk of anaphylaxis. Give yourself an injection as told by your health care provider. Eating and drinking Follow instructions from your health care provider about eating or drinking restrictions. Drink enough fluid to keep your urine pale yellow. General instructions Wear a medical alert bracelet or necklace to let others know that you have had anaphylaxis before. Avoid known allergens whenever possible. Keep all follow-up visits as told by your health care provider. This is important. Contact a health care provider if: Your symptoms do not get better with treatment. Get help right away if: You have symptoms of anaphylaxis. These include: Swollen mouth, tongue, or throat. Pain or tightness in your chest. Trouble breathing or shortness of breath. Dizziness or fainting. Severe abdominal pain, vomiting, or diarrhea. These symptoms may represent a serious problem that is an emergency. Do not wait to see if the symptoms will go away. Get medical help right away. Call your local emergency services (911 in the [place] Do not drive yourself to the hospital. Summary Take or apply over-the-counter and prescription medicines only as told by your health care provider. Avoid known allergens when possible. Always carry your auto-injector pen if you are at risk of anaphylaxis. Give yourself an injection as told by your health care provider. Wear a medical alert bracelet or necklace to let others know that you have had anaphylaxis  before. Anaphylaxis is a life-threatening emergency. Get help right away. This information is not intended to replace advice given to you by your health care provider. Make sure you discuss any questions you have with your health care provider. Document Revised: 03/17/2020 Document Reviewed: 05/30/2019 Elsevier Patient Education  2023 [employer] Inc[employer]  

## 2022-04-13 NOTE — Telephone Encounter (Signed)
[name], Meghan Wilcox are scheduled for a virtual visit with your provider today.    Just as we do with appointments in the office, we must obtain your consent to participate.  Your consent will be active for this visit and any virtual visit you may have with one of our providers in the next 365 days.    If you have a MyChart account, I can also send a copy of this consent to you electronically.  All virtual visits are billed to your insurance company just like a traditional visit in the office.  As this is a virtual visit, video technology does not allow for your provider to perform a traditional examination.  This may limit your provider's ability to fully assess your condition.  If your provider identifies any concerns that need to be evaluated in person or the need to arrange testing such as labs, EKG, etc, we will make arrangements to do so.    Although advances in technology are sophisticated, we cannot ensure that it will always work on either your end or our end.  If the connection with a video visit is poor, we may have to switch to a telephone visit.  With either a video or telephone visit, we are not always able to ensure that we have a secure connection.   I need to obtain your verbal consent now.   Are you willing to proceed with your visit today?   Meghan Wilcox has provided verbal consent on 04/13/2022 for a virtual visit (video or telephone).   Alroy Bailiff 04/13/2022  12:35 PM

## 2022-04-13 NOTE — Progress Notes (Signed)
This service is provided via telemedicine  No vital signs collected/recorded due to the encounter was a telemedicine visit.   Location of patient (ex: home, work):  home  Patient consents to a telephone visit:  yes, see telephone  encounter dated 04/13/22   Location of the provider (ex: office, home):  [facility]   Name of any referring provider:  n/a  Names of all persons participating in the telemedicine service and their role in the encounter:  Medina-Vargas, Davonna Ertl C, NP, Tammy Scott/CMA, and patient.   Time spent on call:  7 minutes       DATE:  04/13/2022 MRN:  [id number]  BIRTHDAY: 1976-07-08   Contact Information     Name Relation Home Work Floyd Spouse [phone]   Burrows,Kyla Daughter   [phone]        Code Status History     Date Active Date Inactive Code Status Order ID Comments User Context   11/21/2020 0627 11/21/2020 1516 Full Code [id number]  Cheri Fowler, MD Inpatient   08/21/2014 1429 08/24/2014 1519 Full Code [id number]  Marton Redwood, MD Inpatient   04/01/2014 0000 04/01/2014 2130 Full Code [id number]  Charolette Forward, MD Inpatient        Chief Complaint  Patient presents with   Follow-up    Patient is here to discuss lab results    HISTORY OF PRESENT ILLNESS: This is a 46 year old female who wants to discuss recent labs. She complains that whenever she misses taking Zyrtec at night, she starts to have sneezing and watery eyes. She is requesting to be referred to an allergy clinic to get tested on what she is allergic to.  LDL 116, elevated. She does not take any statins at this time. She stated that she does not like vegetables but plans to start juicing. She has BMI of 46.35, ranging in morbid obesity. Discussed weight reduction and regular exercise can help reduce LDL.  Platelet 107, low. Denies having bruising nor bleeding problem  PAST MEDICAL HISTORY:  Past Medical  History:  Diagnosis Date   Allergy    seasonal   Blood transfusion age 47yr   Antibody scewwn neg. 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   NAPROXEN SODIUM (ALEVE) 220 MG TABLET    Take 440-880 mg by mouth 2 (two) times daily as needed (pain).   NON FORMULARY    Take 1 Piece by mouth as needed. 1 CBD gummy for sleep as needed.   PANTOPRAZOLE (PROTONIX) 40 MG TABLET    Take 1 tablet (40 mg total) by mouth daily.  Modified Medications   No medications on file  Discontinued Medications   No medications on file     No Known Allergies   REVIEW OF SYSTEMS:  GENERAL: no change in appetite, no fatigue, no weight changes, no fever, chills or weakness SKIN: Denies rash, itching, wounds, ulcer sores, or nail abnormality EYES: Denies change in vision, eyes are watery when she does not take Zyrtec EARS: Denies change in hearing, ringing in ears, or earache NOSE: Denies nasal congestion or epistaxis MOUTH and THROAT: Denies oral discomfort, gingival pain or bleeding, pain from teeth or hoarseness   RESPIRATORY: no cough, SOB, DOE, wheezing, hemoptysis CARDIAC: no chest pain, edema or palpitations GI: no abdominal pain, diarrhea, constipation, heart burn, nausea or vomiting GU: Denies dysuria, frequency, hematuria, incontinence, or discharge MUSCULOSKELETAL: Denies joit pain, muscle pain, back pain, restricted movement, or  unusual weakness NEUROLOGICAL: Denies dizziness, syncope, numbness, or headache PSYCHIATRIC: Denies feeling of depression or anxiety. No report of hallucinations, insomnia, paranoia, or agitation HEME/LYMPH: Denies excessive bruising, petechia, enlarged lymph nodes, or bleeding problems   LABS/RADIOLOGY: Labs reviewed: Basic Metabolic Panel: Recent Labs    04/06/22 0822  NA 142  K 4.5  CL 105  CO2 26  GLUCOSE 84  BUN 11  CREATININE 0.91  CALCIUM 9.4   Liver Function Tests: Recent Labs    04/06/22 0822  AST 24  ALT 18  BILITOT 0.6  PROT 7.2   No results for input(s): "LIPASE", "AMYLASE" in the last 8760 hours. No results for input(s): "AMMONIA" in the last 8760 hours. CBC: Recent Labs    04/06/22 0822  WBC 4.2  NEUTROABS 1,928  HGB 14.2  HCT 43.2  MCV 90.0  PLT 107*   A1C: Invalid input(s): "A1C" Lipid Panel: Recent Labs    04/06/22 0822  HDL 51   Cardiac Enzymes: No results for input(s): "CKTOTAL", "CKMB", "CKMBINDEX", "TROPONINI" in the last 8760 hours. BNP: Invalid input(s): "POCBNP" CBG: No results for input(s): "GLUCAP" in the last 8760 hours.    No results found.  ASSESSMENT/PLAN:  1. Seasonal allergies -  continue Zyrtec -  wants to get tested on common allergens - Ambulatory referral to Allergy  2. Hypercholesterolemia Morbidly obese ([place]) Lab Results  Component Value Date   CHOL 193 04/06/2022   HDL 51 04/06/2022   LDLCALC 116 (H) 04/06/2022   TRIG 138 04/06/2022   CHOLHDL 3.8 04/06/2022   -  discussed regular exercise and weight less can help with LDL reduction -  high intake of vegetables, fruits, nuts, whole grains, lean vegetable or animal protein, and fish -  plans to do juicing -  minimize intake of trans fats, red meat and processed red meats, refined carbohydrates, and sweetened beverage   3. Thrombocytopenia ([place]) Lab Results  Component Value Date   PLT 107 (L) 04/06/2022   -  denies bruising nor bleeding  problems -  will re-check platelet      Time spent on non face to face visit:  19 minutes  The patient gave consent to this telephone visit. Explained to the patient the risk and privacy issue that was involved with this telephone call.   The patient was advised to call back and ask for an in-person evaluation if the symptoms worsen or if the condition fails to improve.   Durenda Age, NP [employer] [phone]

## 2022-05-18 ENCOUNTER — Ambulatory Visit: Payer: BC Managed Care – PPO | Admitting: Internal Medicine

## 2022-05-25 ENCOUNTER — Ambulatory Visit: Payer: BC Managed Care – PPO | Admitting: Internal Medicine

## 2022-06-27 NOTE — Progress Notes (Signed)
New Patient Note  RE: Meghan Wilcox MRN: [id number] DOB: [birth year] Date of Office Visit: 06/28/2022  Consult requested by: Nickola Major* Primary care provider: Nickola Major, NP  Chief Complaint: Allergic Reaction  History of Present Illness: I had the pleasure of seeing Jayelyn Barno for initial evaluation at the [facility] [place] on 06/28/2022. She is a 46 y.o. female, who is referred here by Medina-Vargas, Monina C, NP for the evaluation of allergies  She reports symptoms of right eye watering, some itching/irritation of the right eye only. Symptoms have been going on for 3 years. Noted itchy skin starting last night only.  The symptoms are present all year around. Other triggers include exposure to unknown. Anosmia: no. Headache: no. She has used zyrtec, eye gel, various eye drops with minimal improvement in symptoms. Previous work up includes: none. Previous ENT evaluation: left ear surgery for tumor - resolved. T&A as a child.  Last eye exam: yes - had her tear ducts washed out with no benefit. Next step is surgical intervention.   Patient tried various make ups with no benefit.  Patient usually gets her nails done.   Assessment and Plan: Elora is a 46 y.o. female with: Watery eyes Right sided eye water with minimal associated pruritus/irritation x 3 years. Takes zyrtec daily which helps with skin pruritus but denies any significant rhinitis symptoms. 2 dogs at home. Saw ophthalmologist as well. Tried changing personal care products/make up. Concerned about allergic triggers. Today's skin testing showed: Positive to grass, weed, trees, mold, dust mites, cat and borderline to dog.  Discussed with patient that I'm not sure how much of the above allergens are contributing to her unilateral eye symptoms as environmental allergies usually affect both eyes.  Start environmental control measures as below. Use over the counter antihistamines such  as Zyrtec (cetirizine), Claritin (loratadine), Allegra (fexofenadine), or Xyzal (levocetirizine) daily as needed. May take twice a day during allergy flares. May switch antihistamines every few months. Use cromolyn 4% 1 drop in each eye up to four times a day as needed for itchy/watery eyes.  May use refresh eye drops as needed. Try not to touch/irritate the eyes. Consider allergy injections for long term control if above medications do not help the symptoms - handout given.  Recommend quitting vaping.  Follow up with ophthalmologist as scheduled.   Other allergic rhinitis See assessment and plan as above.  Return in about 3 months (around 09/28/2022).  Meds ordered this encounter  Medications   cromolyn (OPTICROM) 4 % ophthalmic solution    Sig: Place 1 drop into both eyes 4 (four) times daily as needed (itchy/watery eyes).    Dispense:  10 mL    Refill:  3   Lab Orders  No laboratory test(s) ordered today    Other allergy screening: Asthma: no Food allergy: no Medication allergy: no Hymenoptera allergy: no Urticaria: no Eczema:no History of recurrent infections suggestive of immunodeficency: no  Diagnostics: Skin Testing: Environmental allergy panel. Positive to grass, weed, trees, mold, dust mites, cat and borderline to dog.  Results discussed with patient/family.  Airborne Adult Perc - 06/28/22 0957     Time Antigen Placed 0957    Allergen Manufacturer Lavella Hammock    Location Back    Number of Test 59    1. Control-Buffer 50% Glycerol Negative    2. Control-Histamine 1 mg/ml 2+    3. Albumin saline Negative    4. Stratford 4+    5.  [place] 4+    6. Johnson 4+    7. Struble 4+    8. Meadow Fescue 4+    9. Perennial Rye 4+    10. Sweet Vernal 4+    11. Timothy Negative    12. Cocklebur Negative    13. Burweed Marshelder Negative    14. Ragweed, short Negative    15. Ragweed, Giant Negative    16. Plantain,  English Negative    17. Lamb's Quarters 2+    18.  Sheep Sorrell Negative    19. Rough Pigweed Negative    20. Marsh Elder, Rough Negative    21. Mugwort, Common Negative    22. Ash mix Negative    23. Birch mix Negative    24. Beech American Negative    25. Box, Elder Negative    26. Cedar, red Negative    27. Cottonwood, [place] Negative    28. Elm mix Negative    29. Hickory 4+    30. Maple mix Negative    31. Oak, [place] mix Negative    32. Pecan Pollen 4+    33. Pine mix Negative    34. Sycamore Eastern Negative    35. Jenner, Black Pollen Negative    36. Alternaria alternata Negative    37. Cladosporium Herbarum Negative    38. Aspergillus mix Negative    39. Penicillium mix Negative    40. Bipolaris sorokiniana (Helminthosporium) Negative    41. Drechslera spicifera (Curvularia) Negative    42. Mucor plumbeus Negative    43. Fusarium moniliforme Negative    44. Aureobasidium pullulans (pullulara) Negative    45. Rhizopus oryzae Negative    46. Botrytis cinera Negative    47. Epicoccum nigrum Negative    48. Phoma betae Negative    49. Candida Albicans Negative    50. Trichophyton mentagrophytes Negative    51. Mite, D Farinae  5,000 AU/ml 2+    52. Mite, D Pteronyssinus  5,000 AU/ml 2+    53. Cat Hair 10,000 BAU/ml Negative    54.  Dog Epithelia Negative    55. Mixed Feathers Negative    56. Horse Epithelia Negative    57. Cockroach, German Negative    58. Mouse Negative    59. Tobacco Leaf Negative             Intradermal - 06/28/22 1030     Time Antigen Placed 1031    Allergen Manufacturer Greer    Location Arm    Number of Test 9    Control Negative    Ragweed mix Negative    Mold 1 --   +/-   Mold 2 2+    Mold 3 Negative    Mold 4 Negative    Cat 4+    Dog --   +/-   Cockroach Negative             Past Medical History: Patient Active Problem List   Diagnosis Date Noted   Watery eyes 06/28/2022   Other allergic rhinitis 06/28/2022   GAD (generalized anxiety disorder) 01/16/2019       01/16/2019   Cellulitis and abscess of leg 09/22/2018   Smoker 09/19/2018   Hiatal hernia 06/12/2018   Healthcare maintenance 05/09/2018   BMI 40.0-44.9, adult ([place]) 05/09/2018   Plantar fasciitis of right foot 02/11/2015   Deformity of metatarsal bone of right foot 02/11/2015   Equinus deformity of foot, acquired 02/11/2015   Diarrhea 08/22/2014  Colitis, acute 08/21/2014   Leukocytosis 08/21/2014   Chest pain 03/31/2014   Ventral hernia 11/12/2013   SUI (stress urinary incontinence, female) 10/01/2011   Pelvic pain in female 10/01/2011   Past Medical History:  Diagnosis Date   Allergy    seasonal   Blood transfusion age 38yr   Antibody scewwn neg. 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   Cervical mass    Chronic back pain    Diverticulitis    Family history of malignant neoplasm of gastrointestinal tract    Fatty liver    Gastroparesis    GERD (gastroesophageal reflux disease)    Hepatomegaly    History of ovarian cyst    Scoliosis    Seizures ([place]    post partum  eclampsia  22 yrs ago   Past Surgical History: Past Surgical History:  Procedure Laterality Date   BLADDER SUSPENSION  10/01/2011   Procedure: TRANSVAGINAL TAPE (TVT) PROCEDURE;  Surgeon: TClarene Duke MD;  [facility];  Service: Gynecology;  Laterality: N/A;  Solyx Sling   CERVICAL POLYPECTOMY N/A 11/21/2020   Procedure: CERVICAL POLYPECTOMY/REMOVAL OF MASS;  Surgeon: MCheri Fowler MD;  [place] [facility]  Service: Gynecology;  Laterality: N/A;   CHOLECYSTECTOMY  jan 2012   Dr ARonnald Collum  CYSTOSCOPY  10/01/2011   Procedure: CYSTOSCOPY;  Surgeon: TClarene Duke MD;  [facility];  Service: Gynecology;  Laterality: N/A;   INNER EAR SURGERY Left age 46  KNEE SURGERY Left age 46  arthroscopic   LAPAROSCOPY  10/01/2011   Procedure: LAPAROSCOPY OPERATIVE;  Surgeon: TClarene Duke MD;  [facility];   Service: Gynecology;  Laterality: N/A;  removal of right fimbria, fulgeration of endometriosis   PERINEAL LACERATION REPAIR N/A 12/12/2020   Procedure: CERVICAL LACERATION REPAIR;  Surgeon: SDeliah Boston MD;  [facility];  Service: Gynecology;  Laterality: N/A;   Steel rod in her back  age 46  scoliosis   TONSILLECTOMY  as child   TUBAL LIGATION  april 31 2007   uterine ablation  2008   WISDOM TOOTH EXTRACTION     Medication List:  Current Outpatient Medications  Medication Sig Dispense Refill   cetirizine (ZYRTEC) 10 MG tablet Take 10 mg by mouth at bedtime.     cromolyn (OPTICROM) 4 % ophthalmic solution Place 1 drop into both eyes 4 (four) times daily as needed (itchy/watery eyes). 10 mL 3   NON FORMULARY Take 1 Piece by mouth as needed. 1 CBD gummy for sleep as needed.     pantoprazole (PROTONIX) 40 MG tablet Take 1 tablet (40 mg total) by mouth daily. 30 tablet 3   No current facility-administered medications for this visit.   Allergies: No Known Allergies Social History: Social History   Socioeconomic History   Marital status: Married    Spouse name: Not on file   Number of children: 3   Years of education: Not on file   Highest education level: Not on file  Occupational History   Occupation: ADMIN    Employer: [employer]  Tobacco Use   Smoking status: Former    Packs/day: 1.00    Years: 30.00    Total pack years: 30.00    Types: Cigarettes    Quit date: 01/31/2020    Years since quitting: 2.4   Smokeless tobacco: Never  Vaping Use   Vaping Use: Every  day   Substances: Nicotine, CBD, Flavoring  Substance and Sexual Activity   Alcohol use: Yes    Alcohol/week: 0.0 standard drinks of alcohol    Comment: occasional   Drug use: No   Sexual activity: Yes    Birth control/protection: Surgical  Other Topics Concern   Not on file  Social History Narrative   Not on file   Social Determinants of Health   Financial Resource Strain: Not on file   Food Insecurity: Not on file  Transportation Needs: Not on file  Physical Activity: Not on file  Stress: Not on file  Social Connections: Not on file   Lives in a house. Smoking: vapes Occupation: Radiation protection practitioner History: Water Damage/mildew in the house: no Possibly mold around windows. Carpet in the family room: no Carpet in the bedroom: yes Heating: gas Cooling: central Pet: yes 2 dogs x 8 yrs, 2 yrs  Family History: Family History  Problem Relation Age of Onset   Diabetes Mother    Cirrhosis Mother    Irritable bowel syndrome Mother    Crohn's disease Mother    Lung cancer Mother    Heart disease Mother    Diabetes Father    Heart disease Father    Liver disease Father    Cirrhosis Father    Diabetes Brother    Colon cancer Paternal Grandfather 12   Colon cancer Other    Rectal cancer Other    Esophageal cancer Neg Hx    Stomach cancer Neg Hx    Problem                               Relation Asthma                                   no Eczema                                no Food allergy                          no Allergic rhino conjunctivitis     no  Review of Systems  Constitutional:  Negative for appetite change, chills, fever and unexpected weight change.  HENT:  Negative for congestion and rhinorrhea.   Eyes:  Positive for discharge and itching (minimal itching). Negative for pain and redness.  Respiratory:  Negative for cough, chest tightness, shortness of breath and wheezing.   Cardiovascular:  Negative for chest pain.  Gastrointestinal:  Negative for abdominal pain.  Genitourinary:  Negative for difficulty urinating.  Skin:  Negative for rash.       pruritus  Allergic/Immunologic: Positive for environmental allergies.  Neurological:  Negative for headaches.    Objective: BP 122/70   Pulse 67   Temp 98.8 F (37.1 C) (Temporal)   Resp 18   Ht 5' 4"  (1.626 m)   Wt 251 lb 14.4 oz (114.3 kg)   SpO2 96%   BMI 43.24 kg/m   Body mass index is 43.24 kg/m. Physical Exam Vitals and nursing note reviewed.  Constitutional:      Appearance: Normal appearance. She is well-developed. She is obese.  HENT:     Head: Normocephalic and atraumatic.     Right Ear: Tympanic membrane and  external ear normal.     Left Ear: External ear normal.     Ears:     Comments: Had surgery in the past on the TM.    Nose: Nose normal.     Mouth/Throat:     Mouth: Mucous membranes are moist.     Pharynx: Oropharynx is clear.  Eyes:     Conjunctiva/sclera: Conjunctivae normal.     Comments: Minimal tears noted on right eye.  Cardiovascular:     Rate and Rhythm: Normal rate and regular rhythm.     Heart sounds: Normal heart sounds. No murmur heard.    No friction rub. No gallop.  Pulmonary:     Effort: Pulmonary effort is normal.     Breath sounds: Normal breath sounds. No wheezing, rhonchi or rales.  Musculoskeletal:     Cervical back: Neck supple.  Skin:    General: Skin is warm.     Findings: No rash.  Neurological:     Mental Status: She is alert and oriented to person, place, and time.  Psychiatric:        Behavior: Behavior normal.   The plan was reviewed with the patient/family, and all questions/concerned were addressed.  It was my pleasure to see Aleina today and participate in her care. Please feel free to contact me with any questions or concerns.  Sincerely,  Rexene Alberts, DO Allergy & Immunology  [facility] office: [place] office: [phone]

## 2022-06-28 ENCOUNTER — Ambulatory Visit: Payer: BC Managed Care – PPO | Admitting: Allergy

## 2022-06-28 ENCOUNTER — Encounter: Payer: Self-pay | Admitting: Allergy

## 2022-06-28 ENCOUNTER — Other Ambulatory Visit: Payer: Self-pay

## 2022-06-28 VITALS — BP 122/70 | HR 67 | Temp 98.8°F | Resp 18 | Ht 64.0 in | Wt 251.9 lb

## 2022-06-28 DIAGNOSIS — J3089 Other allergic rhinitis: Secondary | ICD-10-CM | POA: Diagnosis not present

## 2022-06-28 DIAGNOSIS — H04203 Unspecified epiphora, bilateral lacrimal glands: Secondary | ICD-10-CM

## 2022-06-28 MED ORDER — CROMOLYN SODIUM 4 % OP SOLN: 1.0000 [drp] | Freq: Four times a day (QID) | OPHTHALMIC | 3 refills | Status: DC | PRN

## 2022-06-28 NOTE — Assessment & Plan Note (Signed)
.   See assessment and plan as above. 

## 2022-06-28 NOTE — Assessment & Plan Note (Signed)
Right sided eye water with minimal associated pruritus/irritation x 3 years. Takes zyrtec daily which helps with skin pruritus but denies any significant rhinitis symptoms. 2 dogs at home. Saw ophthalmologist as well. Tried changing personal care products/make up. Concerned about allergic triggers. Today's skin testing showed: Positive to grass, weed, trees, mold, dust mites, cat and borderline to dog.  Discussed with patient that I'm not sure how much of the above allergens are contributing to her unilateral eye symptoms as environmental allergies usually affect both eyes.  Start environmental control measures as below. Use over the counter antihistamines such as Zyrtec (cetirizine), Claritin (loratadine), Allegra (fexofenadine), or Xyzal (levocetirizine) daily as needed. May take twice a day during allergy flares. May switch antihistamines every few months. Use cromolyn 4% 1 drop in each eye up to four times a day as needed for itchy/watery eyes.  May use refresh eye drops as needed. Try not to touch/irritate the eyes. Consider allergy injections for long term control if above medications do not help the symptoms - handout given.  Recommend quitting vaping.  Follow up with ophthalmologist as scheduled.

## 2022-06-28 NOTE — Patient Instructions (Addendum)
Today's skin testing showed: Positive to grass, weed, trees, mold, dust mites, cat and borderline to dog.   Results given.  Environmental allergies Not sure how much of the above allergens are contributing to your right sided eye symptoms.  Start environmental control measures as below. Use over the counter antihistamines such as Zyrtec (cetirizine), Claritin (loratadine), Allegra (fexofenadine), or Xyzal (levocetirizine) daily as needed. May take twice a day during allergy flares. May switch antihistamines every few months. Use cromolyn 4% 1 drop in each eye up to four times a day as needed for itchy/watery eyes.  May use refresh eye drops as needed. Try not to touch/irritate the eyes.  Consider allergy injections for long term control if above medications do not help the symptoms - handout given.  Recommend quitting vaping.   Follow up in 3 months or sooner if needed.  Follow up with your eye doctor as well.   Reducing Pollen Exposure Pollen seasons: trees (spring), grass (summer) and ragweed/weeds (fall). Keep windows closed in your home and car to lower pollen exposure.  Install air conditioning in the bedroom and throughout the house if possible.  Avoid going out in dry windy days - especially early morning. Pollen counts are highest between 5 - 10 AM and on dry, hot and windy days.  Save outside activities for late afternoon or after a heavy rain, when pollen levels are lower.  Avoid mowing of grass if you have grass pollen allergy. Be aware that pollen can also be transported indoors on people and pets.  Dry your clothes in an automatic dryer rather than hanging them outside where they might collect pollen.  Rinse hair and eyes before bedtime.  Control of House Dust Mite Allergen Dust mite allergens are a common trigger of allergy and asthma symptoms. While they can be found throughout the house, these microscopic creatures thrive in warm, humid environments such as bedding,  upholstered furniture and carpeting. Because so much time is spent in the bedroom, it is essential to reduce mite levels there.  Encase pillows, mattresses, and box springs in special allergen-proof fabric covers or airtight, zippered plastic covers.  Bedding should be washed weekly in hot water (130 F) and dried in a hot dryer. Allergen-proof covers are available for comforters and pillows that can't be regularly washed.  Wash the allergy-proof covers every few months. Minimize clutter in the bedroom. Keep pets out of the bedroom.  Keep humidity less than 50% by using a dehumidifier or air conditioning. You can buy a humidity measuring device called a hygrometer to monitor this.  If possible, replace carpets with hardwood, linoleum, or washable area rugs. If that's not possible, vacuum frequently with a vacuum that has a HEPA filter. Remove all upholstered furniture and non-washable window drapes from the bedroom. Remove all non-washable stuffed toys from the bedroom.  Wash stuffed toys weekly.   Pet Allergen Avoidance: Contrary to popular opinion, there are no "hypoallergenic" breeds of dogs or cats. That is because people are not allergic to an animal's hair, but to an allergen found in the animal's saliva, dander (dead skin flakes) or urine. Pet allergy symptoms typically occur within minutes. For some people, symptoms can build up and become most severe 8 to 12 hours after contact with the animal. People with severe allergies can experience reactions in public places if dander has been transported on the pet owners' clothing. Keeping an animal outdoors is only a partial solution, since homes with pets in the yard still have  higher concentrations of animal allergens. Before getting a pet, ask your allergist to determine if you are allergic to animals. If your pet is already considered part of your family, try to minimize contact and keep the pet out of the bedroom and other rooms where you spend a  great deal of time. As with dust mites, vacuum carpets often or replace carpet with a hardwood floor, tile or linoleum. High-efficiency particulate air (HEPA) cleaners can reduce allergen levels over time. While dander and saliva are the source of cat and dog allergens, urine is the source of allergens from rabbits, hamsters, mice and [place] pigs; so ask a non-allergic family member to clean the animal's cage. If you have a pet allergy, talk to your allergist about the potential for allergy immunotherapy (allergy shots). This strategy can often provide long-term relief.  Mold Control Mold and fungi can grow on a variety of surfaces provided certain temperature and moisture conditions exist.  Outdoor molds grow on plants, decaying vegetation and soil. The major outdoor mold, Alternaria and Cladosporium, are found in very high numbers during hot and dry conditions. Generally, a late summer - fall peak is seen for common outdoor fungal spores. Rain will temporarily lower outdoor mold spore count, but counts rise rapidly when the rainy period ends. The most important indoor molds are Aspergillus and Penicillium. Dark, humid and poorly ventilated basements are ideal sites for mold growth. The next most common sites of mold growth are the bathroom and the kitchen. Outdoor (Seasonal) Mold Control Use air conditioning and keep windows closed. Avoid exposure to decaying vegetation. Avoid leaf raking. Avoid grain handling. Consider wearing a face mask if working in moldy areas.  Indoor (Perennial) Mold Control  Maintain humidity below 50%. Get rid of mold growth on hard surfaces with water, detergent and, if necessary, 5% bleach (do not mix with other cleaners). Then dry the area completely. If mold covers an area more than 10 square feet, consider hiring an indoor environmental professional. For clothing, washing with soap and water is best. If moldy items cannot be cleaned and dried, throw them  away. Remove sources e.g. contaminated carpets. Repair and seal leaking roofs or pipes. Using dehumidifiers in damp basements may be helpful, but empty the water and clean units regularly to prevent mildew from forming. All rooms, especially basements, bathrooms and kitchens, require ventilation and cleaning to deter mold and mildew growth. Avoid carpeting on concrete or damp floors, and storing items in damp areas.   Skin care recommendations  Bath time: Always use lukewarm water. AVOID very hot or cold water. Keep bathing time to 5-10 minutes. Do NOT use bubble bath. Use a mild soap and use just enough to wash the dirty areas. Do NOT scrub skin vigorously.  After bathing, pat dry your skin with a towel. Do NOT rub or scrub the skin.  Moisturizers and prescriptions:  ALWAYS apply moisturizers immediately after bathing (within 3 minutes). This helps to lock-in moisture. Use the moisturizer several times a day over the whole body. Good summer moisturizers include: Aveeno, CeraVe, Cetaphil. Good winter moisturizers include: Aquaphor, Vaseline, Cerave, Cetaphil, Eucerin, Vanicream. When using moisturizers along with medications, the moisturizer should be applied about one hour after applying the medication to prevent diluting effect of the medication or moisturize around where you applied the medications. When not using medications, the moisturizer can be continued twice daily as maintenance.  Laundry and clothing: Avoid laundry products with added color or perfumes. Use unscented hypo-allergenic laundry products  such as Tide free, Cheer free & gentle, and All free and clear.  If the skin still seems dry or sensitive, you can try double-rinsing the clothes. Avoid tight or scratchy clothing such as wool. Do not use fabric softeners or dyer sheets.

## 2022-07-02 ENCOUNTER — Encounter: Payer: Self-pay | Admitting: Adult Health

## 2022-07-02 ENCOUNTER — Ambulatory Visit: Payer: BC Managed Care – PPO | Admitting: Adult Health

## 2022-07-02 VITALS — BP 120/90 | HR 73 | Temp 98.2°F | Resp 18 | Ht 64.0 in | Wt 250.0 lb

## 2022-07-02 DIAGNOSIS — L853 Xerosis cutis: Secondary | ICD-10-CM

## 2022-07-02 DIAGNOSIS — D696 Thrombocytopenia, unspecified: Secondary | ICD-10-CM

## 2022-07-02 DIAGNOSIS — J302 Other seasonal allergic rhinitis: Secondary | ICD-10-CM

## 2022-07-02 DIAGNOSIS — E785 Hyperlipidemia, unspecified: Secondary | ICD-10-CM

## 2022-07-02 DIAGNOSIS — K219 Gastro-esophageal reflux disease without esophagitis: Secondary | ICD-10-CM

## 2022-07-02 LAB — CBC WITH DIFFERENTIAL/PLATELET
Absolute Monocytes: 267 cells/uL (ref 200–950)
Basophils Absolute: 28 cells/uL (ref 0–200)
Basophils Relative: 0.6 %
Eosinophils Absolute: 156 cells/uL (ref 15–500)
Eosinophils Relative: 3.4 %
HCT: 42.3 % (ref 35.0–45.0)
Hemoglobin: 14.3 g/dL (ref 11.7–15.5)
Lymphs Abs: 1716 cells/uL (ref 850–3900)
MCH: 29.8 pg (ref 27.0–33.0)
MCHC: 33.8 g/dL (ref 32.0–36.0)
MCV: 88.1 fL (ref 80.0–100.0)
MPV: 10.8 fL (ref 7.5–12.5)
Monocytes Relative: 5.8 %
Neutro Abs: 2433 cells/uL (ref 1500–7800)
Neutrophils Relative %: 52.9 %
Platelets: 108 10*3/uL — ABNORMAL LOW (ref 140–400)
RBC: 4.8 10*6/uL (ref 3.80–5.10)
RDW: 12.9 % (ref 11.0–15.0)
Total Lymphocyte: 37.3 %
WBC: 4.6 10*3/uL (ref 3.8–10.8)

## 2022-07-02 NOTE — Patient Instructions (Signed)
Thrombocytopenia Thrombocytopenia means that you have a low number of platelets in your blood. Platelets are tiny cells in the blood. When you bleed, they clump together at the cut or injury to stop the bleeding. This is called blood clotting. If you do not have enough platelets, your blood may have trouble clotting. This may cause you to bleed and bruise very easily. What are the causes? This condition is caused by a low number of platelets in your blood. There are three main reasons for this: Your body not making enough platelets. This may be caused by: Bone marrow diseases. Disorders that are passed from parent to child (inherited). Certain cancer medicines or treatments. Infection from germs (bacteria or viruses). Alcoholism. Platelets not being released in the blood. This can be caused by: Having a spleen that is larger than normal. A condition called Gaucher disease. Your body destroying platelets too quickly. This may be caused by: Certain autoimmune diseases. Some medicines that thin your blood. Certain blood clotting disorders. Certain bleeding disorders. Exposure to harmful (toxic) chemicals. Pregnancy. What are the signs or symptoms? Bruising easily. Bleeding from the nose or mouth. Heavy menstrual periods. Blood in the pee (urine), poop (stool), or vomit. A purple-red color to the skin (purpura). A rash that looks like pinpoint, purple-red spots (petechiae) on the lower legs. How is this treated? Treatment depends on the cause. Treatment may include: Treatment of another condition that is causing the low platelet count. Medicines to help protect your platelets from being destroyed. A replacement (transfusion) of platelets to stop or prevent bleeding. Surgery to take out the spleen. Follow these instructions at home: Medicines Take over-the-counter and prescription medicines only as told by your doctor. Do not take any medicines that have aspirin or NSAIDs, such as  ibuprofen. Activity Avoid doing things that could hurt or bruise you. Take action to prevent falls. Do not play contact sports. Ask your doctor what activities are safe for you. Take care not to burn yourself: When you use an iron. When you cook. Take care not to cut yourself: When you shave. When you use scissors, needles, knives, or other tools. General instructions  Check your skin and the inside of your mouth for bruises or blood as told by your doctor. Wear a medical alert bracelet that says that you have a bleeding disorder. Check to see if there is blood in your pee and poop. Do this as told by your doctor. Do not drink alcohol. If you do drink, limit the amount that you drink. Stay away from harmful (toxic) chemicals. Tell all of your doctors that you have this condition. Be sure to tell your dentist and eye doctor. Tell your dentist about your condition before you have your teeth cleaned. Keep all follow-up visits. Contact a doctor if: You have bruises and you do not know why. You have new symptoms. You have symptoms that get worse. You have a fever. Get help right away if: You have very bad bleeding anywhere on your body. You have blood in your vomit, pee, or poop. You have an injury to your head. You have a sudden, very bad headache. Summary Thrombocytopenia means that you have a low number of platelets in your blood. Platelets stick together to form a clot. Symptoms of this condition include getting bruises easily, bleeding from the mouth and nose, a purple-red color to the skin, and a rash. Take care not to cut or burn yourself. This information is not intended to replace advice given  to you by your health care provider. Make sure you discuss any questions you have with your health care provider. Document Revised: 01/01/2021 Document Reviewed: 01/01/2021 Elsevier Patient Education  [place].

## 2022-07-02 NOTE — Progress Notes (Signed)
[facility]  Provider:  Durenda Age DNP  Code Status: Full Code  Goals of Care:     07/02/2022    8:39 AM  Advanced Directives  Does Patient Have a Medical Advance Directive? No  Would patient like information on creating a medical advance directive? No - Patient declined     Chief Complaint  Patient presents with   Medical Management of Chronic Issues    Patient is here for a 3 month follow up for chronic conditions   Quality Metric Gaps    Discuss need for updated vaccines, papp smear is due as well as mammogram     HPI: Patient is a 46 y.o. female seen today for routine visit for management of chronic diseases.   Thrombocytopenia ([place]) - denies bruising nor bleeding  Seasonal allergies - she was seen at the allergy clinic yesterday and was found to be allergic to grass, weed, trees, mold, dust mites, cat and borderline to dog, has itchy on the abdominal area, takes Zyrtec   Hyperlipidemia, unspecified hyperlipidemia type  -  LDL 116, plans to exercise and diet  Morbidly obese ([place])  - had weight loss of 3.6 lbs in 3 months, weight today 250 lbs  Gastroesophageal reflux disease, unspecified whether esophagitis present -  improved with Protonix   Past Medical History:  Diagnosis Date   Allergy    seasonal   Blood transfusion age 49yr   Antibody scewwn neg. 08/20/10 prior to cholecystectomy   Cancer ([place] 2018   2 spots melanoma removed under left breast   Cervical mass    Chronic back pain    Diverticulitis    Family history of malignant neoplasm of gastrointestinal tract    Fatty liver    Gastroparesis    GERD (gastroesophageal reflux disease)    Hepatomegaly    History of ovarian cyst    Scoliosis    Seizures ([place]    post partum  eclampsia  22 yrs ago    Past Surgical History:  Procedure Laterality Date   BLADDER SUSPENSION  10/01/2011   Procedure: TRANSVAGINAL TAPE (TVT) PROCEDURE;  Surgeon: TClarene Duke MD;  [facility];  Service:  Gynecology;  Laterality: N/A;  Solyx Sling   CERVICAL POLYPECTOMY N/A 11/21/2020   Procedure: CERVICAL POLYPECTOMY/REMOVAL OF MASS;  Surgeon: MCheri Fowler MD;  [facility]  Service: Gynecology;  Laterality: N/A;   CHOLECYSTECTOMY  jan 2012   Dr ARonnald Collum  CYSTOSCOPY  10/01/2011   Procedure: CYSTOSCOPY;  Surgeon: TClarene Duke MD;  [facility];  Service: Gynecology;  Laterality: N/A;   INNER EAR SURGERY Left age 46  KNEE SURGERY Left age [age]  arthroscopic   LAPAROSCOPY  10/01/2011   Procedure: LAPAROSCOPY OPERATIVE;  Surgeon: TClarene Duke MD;  [facility];  Service: Gynecology;  Laterality: N/A;  removal of right fimbria, fulgeration of endometriosis   PERINEAL LACERATION REPAIR N/A 12/12/2020   Procedure: CERVICAL LACERATION REPAIR;  Surgeon: SDeliah Boston MD;  [facility];  Service: Gynecology;  Laterality: N/A;   Steel rod in her back  age [id number]  scoliosis   TONSILLECTOMY  as child   TUBAL LIGATION  april 31 2007   uterine ablation  2008   WISDOM TOOTH EXTRACTION      No Known Allergies  Outpatient Encounter Medications as of 07/02/2022  Medication Sig   cetirizine (ZYRTEC) 10 MG tablet Take 10 mg by mouth at bedtime.  cromolyn (OPTICROM) 4 % ophthalmic solution Place 1 drop into both eyes 4 (four) times daily as needed (itchy/watery eyes).   NON FORMULARY Take 1 Piece by mouth as needed. 1 CBD gummy for sleep as needed.   pantoprazole (PROTONIX) 40 MG tablet Take 1 tablet (40 mg total) by mouth daily.   No facility-administered encounter medications on file as of 07/02/2022.    Review of Systems:  Review of Systems  Constitutional:  Negative for appetite change, chills, fatigue and fever.  HENT:  Negative for congestion, hearing loss, rhinorrhea and sore throat.   Eyes: Negative.   Respiratory:  Negative for cough, shortness of breath and wheezing.   Cardiovascular:  Negative for chest pain, palpitations and leg swelling.   Gastrointestinal:  Negative for abdominal pain, constipation, diarrhea, nausea and vomiting.  Genitourinary:  Negative for dysuria.  Musculoskeletal:  Negative for arthralgias, back pain and myalgias.  Skin:  Negative for color change, rash and wound.  Neurological:  Negative for dizziness, weakness and headaches.  Psychiatric/Behavioral:  Negative for behavioral problems. The patient is not nervous/anxious.     Health Maintenance  Topic Date Due   COVID-19 Vaccine (1) Never done   PAP SMEAR-Modifier  Never done   DTaP/Tdap/Td (2 - Td or Tdap) 09/10/2019   MAMMOGRAM  12/14/2019   INFLUENZA VACCINE  10/31/2022 (Originally 03/02/2022)   COLONOSCOPY (Pts 45-52yr Insurance coverage will need to be confirmed)  09/12/2023   Hepatitis C Screening  Completed   HIV Screening  Completed   HPV VACCINES  Aged Out    Physical Exam: Vitals:   07/02/22 0840  BP: (!) 120/90  Pulse: 73  Resp: 18  Temp: 98.2 F (36.8 C)  SpO2: 96%  Weight: 250 lb (113.4 kg)  Height: 5' 4"  (1.626 m)   Body mass index is 42.91 kg/m. Physical Exam Constitutional:      General: She is not in acute distress.    Appearance: She is obese.     Comments: Morbidly obese.  HENT:     Head: Normocephalic and atraumatic.     Comments: Left external ear has piercing -  Left ear old s/p cholesteatoma (done when she was 46years old)    Nose: Nose normal.     Mouth/Throat:     Mouth: Mucous membranes are moist.  Eyes:     Conjunctiva/sclera: Conjunctivae normal.     Comments: Bilateral eyes watery  Cardiovascular:     Rate and Rhythm: Normal rate and regular rhythm.  Pulmonary:     Effort: Pulmonary effort is normal.     Breath sounds: Normal breath sounds.  Abdominal:     General: Bowel sounds are normal.     Palpations: Abdomen is soft.  Musculoskeletal:        General: Normal range of motion.     Cervical back: Normal range of motion.  Skin:    General: Skin is warm and dry.     Findings: Rash present.      Comments: Mild rashes on abdominal area  Neurological:     General: No focal deficit present.     Mental Status: She is alert and oriented to person, place, and time.  Psychiatric:        Mood and Affect: Mood normal.        Behavior: Behavior normal.        Thought Content: Thought content normal.        Judgment: Judgment normal.     Labs reviewed:  Basic Metabolic Panel: Recent Labs    04/06/22 0822  NA 142  K 4.5  CL 105  CO2 26  GLUCOSE 84  BUN 11  CREATININE 0.91  CALCIUM 9.4   Liver Function Tests: Recent Labs    04/06/22 0822  AST 24  ALT 18  BILITOT 0.6  PROT 7.2   No results for input(s): "LIPASE", "AMYLASE" in the last 8760 hours. No results for input(s): "AMMONIA" in the last 8760 hours. CBC: Recent Labs    04/06/22 0822  WBC 4.2  NEUTROABS 1,928  HGB 14.2  HCT 43.2  MCV 90.0  PLT 107*   Lipid Panel: Recent Labs    04/06/22 0822  CHOL 193  HDL 51  LDLCALC 116*  TRIG 138  CHOLHDL 3.8   Lab Results  Component Value Date   HGBA1C 5.4 04/06/2022    Procedures since last visit: No results found.  Assessment/Plan  1. Thrombocytopenia ([place]) Lab Results  Component Value Date   PLT 107 (L) 04/06/2022    -  denies easy bruising nor bleeding - CBC With Differential/Platelet  2. Seasonal allergies -  seen at the allergy clinic and had some testing yesterday, avoid allergens -  continue Zyrtec  3. Hyperlipidemia, unspecified hyperlipidemia type Lab Results  Component Value Date   CHOL 193 04/06/2022   HDL 51 04/06/2022   LDLCALC 116 (H) 04/06/2022   TRIG 138 04/06/2022   CHOLHDL 3.8 04/06/2022    -  will need to be followed up in 6 months  4. Morbidly obese ([place]) Wt Readings from Last 3 Encounters:  07/02/22 250 lb (113.4 kg)  06/28/22 251 lb 14.4 oz (114.3 kg)  04/02/22 253 lb 6.4 oz (114.9 kg)    -  discussed and had a goal of at least 10 lbs weight loss in 6 months  5. Gastroesophageal reflux disease, unspecified  whether esophagitis present -  stable, continue Protonix  6. Dry skin -  avoid allergens that were identified -  instructed to apply Sarna lotion   Labs/tests ordered:  CBC with platelet In 6 months, will need lipid panel and CBC  Next appt:  in 6 months

## 2022-07-06 NOTE — Progress Notes (Signed)
Platelet 108, same as 3 months ago (107) with normal level  at  140-400.  Continues to have mild low platelet count. Will continue to monitor in 6 months/next visit.

## 2022-08-06 ENCOUNTER — Other Ambulatory Visit: Payer: Self-pay | Admitting: *Deleted

## 2022-08-06 DIAGNOSIS — K219 Gastro-esophageal reflux disease without esophagitis: Secondary | ICD-10-CM

## 2022-08-06 MED ORDER — PANTOPRAZOLE SODIUM 40 MG PO TBEC: 40.0000 mg | DELAYED_RELEASE_TABLET | Freq: Every day | ORAL | 1 refills | Status: DC

## 2022-08-06 NOTE — Telephone Encounter (Signed)
[facility] Requested refill.

## 2022-09-29 ENCOUNTER — Other Ambulatory Visit: Payer: Self-pay

## 2022-09-29 ENCOUNTER — Ambulatory Visit (INDEPENDENT_AMBULATORY_CARE_PROVIDER_SITE_OTHER): Payer: BC Managed Care – PPO

## 2022-09-29 ENCOUNTER — Encounter (INDEPENDENT_AMBULATORY_CARE_PROVIDER_SITE_OTHER): Payer: Self-pay

## 2022-09-29 VITALS — BP 110/72 | HR 92 | Temp 99.4°F | Resp 20 | Ht 62.0 in | Wt 256.0 lb

## 2022-09-29 DIAGNOSIS — B349 Viral infection, unspecified: Secondary | ICD-10-CM

## 2022-09-29 LAB — POC COVID-19, FLU A/B, RSV RAPID BY PCR (RESULTS)
INFLUENZA VIRUS A, PCR 4PLEX, POC: NEGATIVE
INFLUENZA VIRUS B, PCR 4PLEX, POC: NEGATIVE
RSV, PCR 4PLEX, POC: NEGATIVE
SARS-COV-2, POC: POSITIVE — AB

## 2022-09-29 NOTE — Progress Notes (Signed)
[facility], [facility]  [place], [place] [address]    PATIENT NAME:  Kaylee Robertson  MRN:  [id number]  DOB:  1975/12/19  DATE OF SERVICE:  09/29/2022    History of Present Illness:  Kaylee Robertson is a 47 y.o. female who presents to Urgent Care today with chief complaint of:   Chief Complaint              Generalized Body Aches     Fever     Congestion           Location:  HENT  Quality:  Nasal congestion  Onset:  1 day ago  Context:  Pt reports that she's had no known sick contact. She states that she doesn't smoke, but vapes.  Associated symptoms:  Pt associates chills and myalgias.     Functional Health Screening:        I reviewed and confirmed the patient's past medical history taken by the nurse or medical assistant with the addition of the following:    Past Medical History:    History reviewed. No pertinent past medical history.  Past Surgical History:    History reviewed. No pertinent surgical history.  Allergies:  No Known Allergies  Medications:    Current Outpatient Medications   Medication Sig    cetirizine (ZYRTEC) 10 mg Oral Tablet Take 1 Tablet (10 mg total) by mouth Once a day    pantoprazole (PROTONIX) 40 mg Oral Tablet, Delayed Release (E.C.) Take 1 Tablet (40 mg total) by mouth Once a day     Social History:    Social History     Socioeconomic History    Marital status: Married     Spouse name: Not on file    Number of children: Not on file    Years of education: Not on file    Highest education level: Not on file   Occupational History    Not on file   Tobacco Use    Smoking status: Every Day    Smokeless tobacco: Never    Tobacco comments:     Vapes daily   Substance and Sexual Activity    Alcohol use: Not Currently    Drug use: Never    Sexual activity: Not on file   Other Topics Concern    Not on file   Social History Narrative    Not on file     Social Determinants of Health     Financial Resource Strain: Not on file    Transportation Needs: Not on file   Social Connections: Unknown (11/30/2021)    Received from [facility]: Not on file   Intimate Partner Violence: Unknown (11/13/2021)    Received from [place]     Physically Hurt: Not on file     Insult or Talk Down To: Not on file     Threaten Physical Harm: Not on file     Scream or Curse: Not on file   Housing Stability: Not on file     Family History:  No significant family history.    No family history on file.    Review of Systems:  Review of Systems   Constitutional:  Positive for fever. Negative for chills.   HENT:  Negative for congestion.    Respiratory:  Positive for cough.    Gastrointestinal:  Positive for diarrhea and vomiting.   Musculoskeletal:  Negative for myalgias.   All other systems reviewed and are negative.    Physical Exam:  Vital signs:   Vitals:    09/29/22 1025   BP: 110/72   Pulse: 92   Resp: 20   Temp: 37.4 C (99.4 F)   TempSrc: Thermal Scan   SpO2: 95%   Weight: 116 kg (256 lb)   Height: 1.575 m (5\' 2" )   BMI: 46.92     Body mass index is 46.82 kg/m. Facility age limit for growth %iles is 20 years.  No LMP recorded.    Physical Exam  Vitals reviewed.   Constitutional:       Appearance: Normal appearance.   HENT:      Head: Normocephalic and atraumatic.      Mouth/Throat:      Mouth: Mucous membranes are moist.   Cardiovascular:      Rate and Rhythm: Normal rate and regular rhythm.   Pulmonary:      Effort: Pulmonary effort is normal.      Breath sounds: Normal breath sounds.   Abdominal:      General: There is no distension.      Palpations: Abdomen is soft.      Tenderness: There is no abdominal tenderness. There is no guarding.   Skin:     General: Skin is warm and dry.   Neurological:      General: No focal deficit present.      Mental Status: She is alert and oriented to person, place, and time.        Data Reviewed:    Results for orders placed or performed in visit on 09/29/22 (from the past  12 hour(s))   POC COVID-19, FLU A/B, RSV RAPID BY PCR (RESULTS)   Result Value Ref Range    SARS-COV-2, POC Positive (A) Negative    INFLUENZA VIRUS A, PCR 4PLEX, POC Negative Negative    INFLUENZA VIRUS B, PCR 4PLEX, POC Negative Negative    RSV, PCR 4PLEX, POC Negative Negative     Differential Diagnosis:  COVID-19 vs Influenza vs URI    Assessment: 47 y.o.female presents with Generalized Body Aches, Fever, and Congestion    Diagnosis:    ICD-10-CM    1. Viral illness  B34.9 PERFORM POC COVID-19, FLU A/B, RSV RAPID BY PCR - CLINIC ONLY        Orders Placed This Encounter    PERFORM POC COVID-19, FLU A/B, RSV RAPID BY PCR - CLINIC ONLY     Cepheid 4-Plex by PCR returned POSITIVE for COVID-19, negative for influenza A, negative for influenza B, and negative for RSV.  Advised patient to isolate according to CDC guidelines.   Educated on symptomatic treatment with OTC medications and remedies.  Recommended supportive care with rest and hydration.   Advised to follow up as needed for worsening or persisting symptoms.     Condition at discharge:  Good    Advised patient/patient's guardian(s) to go to the Emergency Department immediately for further work up, if any concerning symptoms.  If symptoms are worsening or not improving, the patient should return to Urgent Care for further evaluation.  Plan was discussed and patient/patient's guardian(s) verbalized understanding.      I am scribing for, and in the presence of, Dr. Charolotte Eke for services provided on 09/29/2022.  [employer] investment [employer], SCRIBE      I personally performed the services described in this documentation, as  scribed  in my presence, and it is both accurate  and complete.    Charolotte Eke, MD      Dr. Charolotte Eke 09/29/2022 10:34  Assistant Professor, Emergency Medicine   Department of Emergency Medicine   [facility]

## 2022-09-29 NOTE — Nursing Note (Signed)
Patient has given verbal permission for the scribe to assist the healthcare provider during the patient's visit.Doreen Beam, RN 09/29/2022, 10:31

## 2022-09-29 NOTE — Patient Instructions (Signed)
Follow-up your results on MyChart.  You will receive a call with follow-up instructions if the results are positive.  If you test positive for COVID, self quarantine as per CDC protocol.  Alternate between Tylenol and ibuprofen every 4-6 hours as needed for pain or a temperature of 100.4 or above.

## 2022-12-10 ENCOUNTER — Ambulatory Visit (INDEPENDENT_AMBULATORY_CARE_PROVIDER_SITE_OTHER): Payer: BLUE CROSS/BLUE SHIELD

## 2022-12-10 ENCOUNTER — Encounter: Payer: Self-pay | Admitting: Podiatry

## 2022-12-10 ENCOUNTER — Ambulatory Visit: Payer: BC Managed Care – PPO | Admitting: Podiatry

## 2022-12-10 DIAGNOSIS — M722 Plantar fascial fibromatosis: Secondary | ICD-10-CM | POA: Diagnosis not present

## 2022-12-10 DIAGNOSIS — L600 Ingrowing nail: Secondary | ICD-10-CM

## 2022-12-10 MED ORDER — TRIAMCINOLONE ACETONIDE 10 MG/ML IJ SUSP
10.0000 mg | Freq: Once | INTRAMUSCULAR | Status: AC
  Administered 2022-12-10: 10 mg

## 2022-12-10 MED ORDER — DICLOFENAC SODIUM 75 MG PO TBEC
75.0000 mg | DELAYED_RELEASE_TABLET | Freq: Two times a day (BID) | ORAL | 2 refills | Status: DC
Start: 2022-12-10 — End: 2023-01-10

## 2022-12-10 NOTE — Progress Notes (Signed)
Patient presents today subjective:   Patient ID: Meghan Wilcox, female   DOB: 47 y.o.   MRN: [id number]   HPI She has a very painful right big toenail medial border that is been this way for around 2 weeks and also has exquisite discomfort left plantar heel 50-month duration worse when she gets up and after periods of sitting   ROS      Objective:  Physical Exam  Neuro vascular status intact with incurvation of the right hallux medial border distal redness no active drainage noted with pain and inflammation pain of the plantar heel region left with moderate depression of the arch     Assessment:  Ingrown toenail deformity right hallux medial border with pain acute plantar fasciitis left     Plan:  H&P reviewed both conditions and discussed.  For the left I did sterile prep injected the plantar fascia 3 mg Kenalog 5 mg Xylocaine applied fascial brace and instructed on reduced activity and good gave instructions for physical therapy and on the right recommended correction and allowed her to read then signed consent form going over procedure.  She understands risk signed consent form and at this point anesthetized 60 mg like Marcaine mixture sterile prep done using sterile instrumentation remove the medial border exposed matrix applied phenol 3 applications 30 seconds followed by alcohol lavage sterile dressing gave instructions on soaks wear dressing 24 hours take it off earlier if throbbing were to occur and encouraged her to call with questions concerns which may arise  X-rays indicate spur formation plantar heel left no indication stress fracture arthritis

## 2022-12-10 NOTE — Patient Instructions (Signed)

## 2022-12-24 ENCOUNTER — Ambulatory Visit (INDEPENDENT_AMBULATORY_CARE_PROVIDER_SITE_OTHER): Payer: BC Managed Care – PPO | Admitting: Podiatry

## 2022-12-24 ENCOUNTER — Encounter: Payer: Self-pay | Admitting: Podiatry

## 2022-12-24 DIAGNOSIS — L600 Ingrowing nail: Secondary | ICD-10-CM

## 2022-12-24 DIAGNOSIS — M722 Plantar fascial fibromatosis: Secondary | ICD-10-CM | POA: Diagnosis not present

## 2022-12-24 DIAGNOSIS — N951 Menopausal and female climacteric states: Secondary | ICD-10-CM | POA: Insufficient documentation

## 2022-12-24 MED ORDER — TRIAMCINOLONE ACETONIDE 10 MG/ML IJ SUSP
10.0000 mg | Freq: Once | INTRAMUSCULAR | Status: AC
Start: 2022-12-24 — End: 2022-12-24
  Administered 2022-12-24: 10 mg

## 2022-12-28 NOTE — Progress Notes (Signed)
Subjective:   Patient ID: Meghan Wilcox, female   DOB: 47 y.o.   MRN: [id number]   HPI Patient states she is improved but she has a spot on the central and outside portion of the heel that still is getting quite sore with ambulation   ROS      Objective:  Physical Exam  Neurovascular status intact with intense discomfort of the central and lateral band of the plantar fascia left     Assessment:  Improvement medial band fasciitis with center and lateral band fasciitis noted     Plan:  Explained continued shoe gear modifications went ahead and on the outside did sterile prep injected the fascia 3 mg Kenalog 5 mg Xylocaine lateral and central side and patient tolerated well will be seen back as symptoms indicate

## 2023-01-10 ENCOUNTER — Ambulatory Visit (INDEPENDENT_AMBULATORY_CARE_PROVIDER_SITE_OTHER): Payer: BC Managed Care – PPO | Admitting: Adult Health

## 2023-01-10 ENCOUNTER — Encounter: Payer: Self-pay | Admitting: Adult Health

## 2023-01-10 VITALS — BP 122/78 | HR 71 | Temp 97.8°F | Resp 18 | Ht 64.0 in | Wt 253.2 lb

## 2023-01-10 DIAGNOSIS — D696 Thrombocytopenia, unspecified: Secondary | ICD-10-CM | POA: Diagnosis not present

## 2023-01-10 DIAGNOSIS — Z113 Encounter for screening for infections with a predominantly sexual mode of transmission: Secondary | ICD-10-CM

## 2023-01-10 DIAGNOSIS — G4700 Insomnia, unspecified: Secondary | ICD-10-CM | POA: Diagnosis not present

## 2023-01-10 DIAGNOSIS — K219 Gastro-esophageal reflux disease without esophagitis: Secondary | ICD-10-CM | POA: Diagnosis not present

## 2023-01-10 DIAGNOSIS — M1990 Unspecified osteoarthritis, unspecified site: Secondary | ICD-10-CM

## 2023-01-10 DIAGNOSIS — J302 Other seasonal allergic rhinitis: Secondary | ICD-10-CM

## 2023-01-10 DIAGNOSIS — E7800 Pure hypercholesterolemia, unspecified: Secondary | ICD-10-CM

## 2023-01-10 DIAGNOSIS — F321 Major depressive disorder, single episode, moderate: Secondary | ICD-10-CM

## 2023-01-10 LAB — CBC WITH DIFFERENTIAL/PLATELET
Hemoglobin: 14.2 g/dL (ref 11.7–15.5)
MCH: 29.3 pg (ref 27.0–33.0)
MCV: 88.9 fL (ref 80.0–100.0)
MPV: 10.8 fL (ref 7.5–12.5)
Monocytes Relative: 6.5 %
Neutrophils Relative %: 49.9 %
Total Lymphocyte: 39.9 %
WBC: 4.5 10*3/uL (ref 3.8–10.8)

## 2023-01-10 MED ORDER — SERTRALINE HCL 50 MG PO TABS
50.0000 mg | ORAL_TABLET | Freq: Every day | ORAL | 3 refills | Status: DC
Start: 2023-01-10 — End: 2023-05-17

## 2023-01-10 MED ORDER — ZOLPIDEM TARTRATE 5 MG PO TABS
5.0000 mg | ORAL_TABLET | Freq: Every evening | ORAL | 0 refills | Status: DC | PRN
Start: 2023-01-10 — End: 2023-02-28

## 2023-01-10 NOTE — Progress Notes (Unsigned)
[facility]  Provider: Kenard Gower DNP  Code Status:  Full Code  Goals of Care:     01/10/2023    8:18 AM  Advanced Directives  Does Patient Have a Medical Advance Directive? No  Would patient like information on creating a medical advance directive? No - Patient declined     Chief Complaint  Patient presents with   Follow-up    fasting labs and six months follow-up   Quality Metric Gaps    Needs to discuss pap-smear, DTAP, Mammogram and Hepatitis C Screening.     HPI: Patient is a 47 y.o. female seen today for a 6 month follow up on chronic issues.  She stated that her youngest child is an incoming senior in fall 2024. When her daughter goes to college, she does not know what is "her use". She has 5 children. She stated that she and her husband have different interests. PHQ-9 score was 14, ranging in mild depression. She has difficulty sleeping and requesting for Ambien.She goes to bed at 9:30 PM but does not fall asleep till 11:00 PM  Thrombocytopenia (HCC) - platelet 108, 07/02/22, no bruising nor bleeding  Gastroesophageal reflux disease without esophagitis -  denies abdominal pain, takes Pantoprazole  Seasonal allergies -  no sneezing, takes Cetirizine  Morbidly obese (HCC) -  BMI 43.46, walks her dog around the neighborhood, gained 3 lbs in 6 months  Elevated LDL cholesterol level - LDL 116, 04/06/22, not on medication   Wt Readings from Last 3 Encounters:  01/10/23 253 lb 3.2 oz (114.9 kg)  07/02/22 250 lb (113.4 kg)  06/28/22 251 lb 14.4 oz (114.3 kg)     Past Medical History:  Diagnosis Date   Allergy    seasonal   Blood transfusion age 18yrs   Antibody scewwn neg. 08/20/10 prior to cholecystectomy   Cancer (HCC) 2018   2 spots melanoma removed under left breast   Cervical mass    Chronic back pain    Diverticulitis    Family history of malignant neoplasm of gastrointestinal tract    Fatty liver    Gastroparesis    GERD (gastroesophageal  reflux disease)    Hepatomegaly    History of ovarian cyst    Scoliosis    Seizures (HCC)    post partum  eclampsia  22 yrs ago    Past Surgical History:  Procedure Laterality Date   BLADDER SUSPENSION  10/01/2011   Procedure: TRANSVAGINAL TAPE (TVT) PROCEDURE;  Surgeon: Zenaida Niece, MD;  Location: [facility];  Service: Gynecology;  Laterality: N/A;  Solyx Sling   CERVICAL POLYPECTOMY N/A 11/21/2020   Procedure: CERVICAL POLYPECTOMY/REMOVAL OF MASS;  Surgeon: Lavina Hamman, MD;  Location: [facility];  Service: Gynecology;  Laterality: N/A;   CHOLECYSTECTOMY  jan 2012   Dr Bertram Savin   CYSTOSCOPY  10/01/2011   Procedure: CYSTOSCOPY;  Surgeon: Zenaida Niece, MD;  Location: [facility];  Service: Gynecology;  Laterality: N/A;   INNER EAR SURGERY Left age 61   KNEE SURGERY Left age 5   arthroscopic   LAPAROSCOPY  10/01/2011   Procedure: LAPAROSCOPY OPERATIVE;  Surgeon: Zenaida Niece, MD;  Location: [facility];  Service: Gynecology;  Laterality: N/A;  removal of right fimbria, fulgeration of endometriosis   PERINEAL LACERATION REPAIR N/A 12/12/2020   Procedure: CERVICAL LACERATION REPAIR;  Surgeon: Carlisle Cater, MD;  Location: [facility];  Service: Gynecology;  Laterality: N/A;   Steel rod in her back  age 62   scoliosis   TONSILLECTOMY  as child   TUBAL LIGATION  april 31 2007   uterine ablation  2008   WISDOM TOOTH EXTRACTION      No Known Allergies  Outpatient Encounter Medications as of 01/10/2023  Medication Sig   cetirizine (ZYRTEC) 10 MG tablet Take 10 mg by mouth at bedtime.   NON FORMULARY Take 1 Piece by mouth as needed. 1 CBD gummy for sleep as needed.   pantoprazole (PROTONIX) 40 MG tablet Take 1 tablet (40 mg total) by mouth daily.   sertraline (ZOLOFT) 50 MG tablet Take 1 tablet (50 mg total) by mouth daily.   zolpidem (AMBIEN) 5 MG tablet Take 1 tablet (5 mg total) by mouth at bedtime as needed for sleep.   [DISCONTINUED] diclofenac (VOLTAREN) 75 MG EC  tablet Take 1 tablet (75 mg total) by mouth 2 (two) times daily.   No facility-administered encounter medications on file as of 01/10/2023.    Review of Systems:  Review of Systems  Constitutional:  Negative for appetite change, chills, fatigue and fever.  HENT:  Negative for congestion, hearing loss, rhinorrhea and sore throat.   Eyes: Negative.   Respiratory:  Negative for cough, shortness of breath and wheezing.   Cardiovascular:  Negative for chest pain, palpitations and leg swelling.  Gastrointestinal:  Negative for abdominal pain, constipation, diarrhea, nausea and vomiting.  Genitourinary:  Negative for dysuria.  Musculoskeletal:  Negative for arthralgias, back pain and myalgias.  Skin:  Negative for color change, rash and wound.  Neurological:  Negative for dizziness, weakness and headaches.  Psychiatric/Behavioral:  Positive for sleep disturbance. Negative for behavioral problems. The patient is not nervous/anxious.     Health Maintenance  Topic Date Due   PAP SMEAR-Modifier  Never done   DTaP/Tdap/Td (2 - Td or Tdap) 09/10/2019   MAMMOGRAM  12/14/2019   COVID-19 Vaccine (1) 01/26/2023 (Originally 08/26/1976)   INFLUENZA VACCINE  03/03/2023   Colonoscopy  09/12/2023   Hepatitis C Screening  Completed   HIV Screening  Completed   HPV VACCINES  Aged Out    Physical Exam: Vitals:   01/10/23 0854  BP: 122/78  Pulse: 71  Resp: 18  Temp: 97.8 F (36.6 C)  SpO2: 94%  Weight: 253 lb 3.2 oz (114.9 kg)  Height: 5\' 4"  (1.626 m)   Body mass index is 43.46 kg/m. Physical Exam Constitutional:      General: She is not in acute distress.    Appearance: She is obese.     Comments: Morbidly obese  HENT:     Head: Normocephalic and atraumatic.     Nose: Nose normal.     Mouth/Throat:     Mouth: Mucous membranes are moist.  Eyes:     Conjunctiva/sclera: Conjunctivae normal.  Cardiovascular:     Rate and Rhythm: Normal rate and regular rhythm.  Pulmonary:     Effort:  Pulmonary effort is normal.     Breath sounds: Normal breath sounds.  Abdominal:     General: Bowel sounds are normal.     Palpations: Abdomen is soft.  Musculoskeletal:        General: No deformity. Normal range of motion.     Cervical back: Normal range of motion.  Skin:    General: Skin is warm and dry.  Neurological:     General: No focal deficit present.     Mental Status: She is alert and oriented to person, place, and time.  Psychiatric:  Mood and Affect: Mood normal.        Behavior: Behavior normal.        Thought Content: Thought content normal.        Judgment: Judgment normal.     Labs reviewed: Basic Metabolic Panel: Recent Labs    04/06/22 0822  NA 142  K 4.5  CL 105  CO2 26  GLUCOSE 84  BUN 11  CREATININE 0.91  CALCIUM 9.4   Liver Function Tests: Recent Labs    04/06/22 0822  AST 24  ALT 18  BILITOT 0.6  PROT 7.2   No results for input(s): "LIPASE", "AMYLASE" in the last 8760 hours. No results for input(s): "AMMONIA" in the last 8760 hours. CBC: Recent Labs    04/06/22 0822 07/02/22 0905 01/10/23 0929  WBC 4.2 4.6 4.5  NEUTROABS 1,928 2,433 2,246  HGB 14.2 14.3 14.2  HCT 43.2 42.3 43.1  MCV 90.0 88.1 88.9  PLT 107* 108* 123*   Lipid Panel: Recent Labs    04/06/22 0822 01/10/23 0929  CHOL 193 189  HDL 51 54  LDLCALC 116* 113*  TRIG 138 112  CHOLHDL 3.8 3.5   Lab Results  Component Value Date   HGBA1C 5.4 04/06/2022    Procedures since last visit: No results found.  Assessment/Plan  1. Moderate major depression (HCC) -  PHQ-9 score 14, ranging as mild depression - sertraline (ZOLOFT) 50 MG tablet; Take 1 tablet (50 mg total) by mouth daily.  Dispense: 30 tablet; Refill: 3  2. Thrombocytopenia (HCC) -  no bruising nor bleeding episode - CBC With Differential/Platelet  3. Insomnia, unspecified type - zolpidem (AMBIEN) 5 MG tablet; Take 1 tablet (5 mg total) by mouth at bedtime as needed for sleep.  Dispense: 30  tablet; Refill: 0  4. Gastroesophageal reflux disease without esophagitis -  stable -  continue Protonix  5. Seasonal allergies -  stable, continue Cetirizine  6. Morbidly obese (HCC) Body mass index is 43.46 kg/m. -  counseled regarding diet and exercise  7. Elevated LDL cholesterol level -  encouraged to eat more fruits and vegetable - Lipid panel    Labs/tests ordered:  CBC and lipid panel  Next appt:  Visit date not found

## 2023-01-11 LAB — CBC WITH DIFFERENTIAL/PLATELET
Absolute Monocytes: 293 cells/uL (ref 200–950)
Basophils Absolute: 18 cells/uL (ref 0–200)
Basophils Relative: 0.4 %
Eosinophils Absolute: 149 cells/uL (ref 15–500)
Eosinophils Relative: 3.3 %
HCT: 43.1 % (ref 35.0–45.0)
Lymphs Abs: 1796 cells/uL (ref 850–3900)
MCHC: 32.9 g/dL (ref 32.0–36.0)
Neutro Abs: 2246 cells/uL (ref 1500–7800)
Platelets: 123 10*3/uL — ABNORMAL LOW (ref 140–400)
RBC: 4.85 10*6/uL (ref 3.80–5.10)
RDW: 12.7 % (ref 11.0–15.0)

## 2023-01-11 LAB — LIPID PANEL
Cholesterol: 189 mg/dL (ref <200)
HDL: 54 mg/dL (ref >=50)
LDL Cholesterol (Calc): 113 mg/dL (calc) — ABNORMAL HIGH
Non-HDL Cholesterol (Calc): 135 mg/dL (calc) — ABNORMAL HIGH (ref <130)
Total CHOL/HDL Ratio: 3.5 (calc) (ref <5.0)
Triglycerides: 112 mg/dL (ref <150)

## 2023-01-13 NOTE — Progress Notes (Signed)
-    platelet 123, up from 108, improved -  LDL 113, down from 116, will need to do moderate exercise at least 150 minutes/week and eat more fruits and vegetables

## 2023-02-21 ENCOUNTER — Encounter: Payer: Self-pay | Admitting: Adult Health

## 2023-02-21 ENCOUNTER — Other Ambulatory Visit: Payer: Self-pay

## 2023-02-21 ENCOUNTER — Telehealth (INDEPENDENT_AMBULATORY_CARE_PROVIDER_SITE_OTHER): Payer: BC Managed Care – PPO | Admitting: Adult Health

## 2023-02-21 VITALS — Temp 99.3°F

## 2023-02-21 DIAGNOSIS — K5792 Diverticulitis of intestine, part unspecified, without perforation or abscess without bleeding: Secondary | ICD-10-CM

## 2023-02-21 MED ORDER — CIPROFLOXACIN HCL 500 MG PO TABS
500.0000 mg | ORAL_TABLET | Freq: Two times a day (BID) | ORAL | 0 refills | Status: AC
Start: 2023-02-21 — End: 2023-02-28

## 2023-02-21 MED ORDER — METRONIDAZOLE 500 MG PO TABS
500.0000 mg | ORAL_TABLET | Freq: Two times a day (BID) | ORAL | 0 refills | Status: AC
Start: 2023-02-21 — End: 2023-02-28

## 2023-02-21 NOTE — Progress Notes (Addendum)
This service is provided via telemedicine  No vital signs collected/recorded due to the encounter was a telemedicine visit.   Location of patient (ex: home, work):  Home   Patient consents to a telephone visit:  yes  Location of the provider (ex: office, home):  Office([facility])  Names of all persons participating in the telemedicine service and their role in the encounter:  Me and Patient . Waiting for Tally Joe to join  Time spent on call:  6 min   Video visit time         02/21/2023    3:08 PM  Advanced Directives  Does Patient Have a Medical Advance Directive? No  Would patient like information on creating a medical advance directive? No - Patient declined     Chief Complaint  Patient presents with   Acute Visit    Patient states she is having a Diverticulits Flare up. Patient states she is having sharp pain in stomach, low grade fever, and no solid bowel movement.She has been only drinking liquids mainly . She is taking taking a laxative to clean her system and tylenol     HPI: Patient is a 47 y.o. female seen for seen for low grade fever and abd pain.   Pt has a hx of diverticulitis and has seen Dr Marina Goodell in the past. She reports LLQ pain for two days and low grade fever 99.3.  She has been taking laxatives to flush the colon out. Stools are loose. No blood stool. No dysuria or frequency. No severe pain or weakness. She is eating a bland diet. Able to hold food down. No nausea.      Past Medical History:  Diagnosis Date   Allergy    seasonal   Blood transfusion age 28yrs   Antibody scewwn neg. 08/20/10 prior to cholecystectomy   Cancer (HCC) 2018   2 spots melanoma removed under left breast   Cervical mass    Chronic back pain    Diverticulitis    Family history of malignant neoplasm of gastrointestinal tract    Fatty liver    Gastroparesis    GERD (gastroesophageal reflux disease)    Hepatomegaly    History of ovarian cyst    Scoliosis     Seizures (HCC)    post partum  eclampsia  22 yrs ago    Past Surgical History:  Procedure Laterality Date   BLADDER SUSPENSION  10/01/2011   Procedure: TRANSVAGINAL TAPE (TVT) PROCEDURE;  Surgeon: Zenaida Niece, MD;  Location: [facility];  Service: Gynecology;  Laterality: N/A;  Solyx Sling   CERVICAL POLYPECTOMY N/A 11/21/2020   Procedure: CERVICAL POLYPECTOMY/REMOVAL OF MASS;  Surgeon: Lavina Hamman, MD;  Location: [facility] ;  Service: Gynecology;  Laterality: N/A;   CHOLECYSTECTOMY  jan 2012   Dr Bertram Savin   CYSTOSCOPY  10/01/2011   Procedure: CYSTOSCOPY;  Surgeon: Zenaida Niece, MD;  Location: [facility];  Service: Gynecology;  Laterality: N/A;   INNER EAR SURGERY Left age 38   KNEE SURGERY Left age 47   arthroscopic   LAPAROSCOPY  10/01/2011   Procedure: LAPAROSCOPY OPERATIVE;  Surgeon: Zenaida Niece, MD;  Location: [facility];  Service: Gynecology;  Laterality: N/A;  removal of right fimbria, fulgeration of endometriosis   PERINEAL LACERATION REPAIR N/A 12/12/2020   Procedure: CERVICAL LACERATION REPAIR;  Surgeon: Carlisle Cater, MD;  Location: [facility];  Service: Gynecology;  Laterality: N/A;   Steel rod in her back  age 89  scoliosis   TONSILLECTOMY  as child   TUBAL LIGATION  april 31 2007   uterine ablation  2008   WISDOM TOOTH EXTRACTION      No Known Allergies  Outpatient Encounter Medications as of 02/21/2023  Medication Sig   cetirizine (ZYRTEC) 10 MG tablet Take 10 mg by mouth at bedtime.   ciprofloxacin (CIPRO) 500 MG tablet Take 1 tablet (500 mg total) by mouth 2 (two) times daily for 7 days.   metroNIDAZOLE (FLAGYL) 500 MG tablet Take 1 tablet (500 mg total) by mouth in the morning and at bedtime for 7 days. Advised no alcohol while on this med.   NON FORMULARY Take 1 Piece by mouth as needed. 1 CBD gummy for sleep as needed.   pantoprazole (PROTONIX) 40 MG tablet Take 1 tablet (40 mg total) by mouth daily.   sertraline (ZOLOFT) 50 MG tablet Take 1  tablet (50 mg total) by mouth daily.   zolpidem (AMBIEN) 5 MG tablet Take 1 tablet (5 mg total) by mouth at bedtime as needed for sleep.   No facility-administered encounter medications on file as of 02/21/2023.    Review of Systems:  Review of Systems  Constitutional:  Positive for fever. Negative for activity change, appetite change, chills, diaphoresis, fatigue and unexpected weight change.  HENT:  Negative for congestion.   Respiratory:  Negative for cough, shortness of breath and wheezing.   Cardiovascular:  Negative for chest pain, palpitations and leg swelling.  Gastrointestinal:  Positive for abdominal pain and diarrhea. Negative for abdominal distention, anal bleeding, blood in stool, constipation, nausea, rectal pain and vomiting.  Genitourinary:  Negative for difficulty urinating and dysuria.  Musculoskeletal:  Negative for arthralgias, back pain, gait problem, joint swelling and myalgias.  Neurological:  Negative for dizziness, tremors, seizures, syncope, facial asymmetry, speech difficulty, weakness, light-headedness, numbness and headaches.  Psychiatric/Behavioral:  Negative for agitation, behavioral problems and confusion.     Health Maintenance  Topic Date Due   PAP SMEAR-Modifier  Never done   DTaP/Tdap/Td (2 - Td or Tdap) 09/10/2019   MAMMOGRAM  12/14/2019   COVID-19 Vaccine (1 - 2023-24 season) Never done   INFLUENZA VACCINE  03/03/2023   Colonoscopy  09/12/2023   Hepatitis C Screening  Completed   HIV Screening  Completed   HPV VACCINES  Aged Out    Physical Exam: Vitals:   02/21/23 1506  Temp: 99.3 F (37.4 C)   There is no height or weight on file to calculate BMI.   Labs reviewed: Basic Metabolic Panel: Recent Labs    04/06/22 0822  NA 142  K 4.5  CL 105  CO2 26  GLUCOSE 84  BUN 11  CREATININE 0.91  CALCIUM 9.4   Liver Function Tests: Recent Labs    04/06/22 0822  AST 24  ALT 18  BILITOT 0.6  PROT 7.2   No results for input(s):  "LIPASE", "AMYLASE" in the last 8760 hours. No results for input(s): "AMMONIA" in the last 8760 hours. CBC: Recent Labs    04/06/22 0822 07/02/22 0905 01/10/23 0929  WBC 4.2 4.6 4.5  NEUTROABS 1,928 2,433 2,246  HGB 14.2 14.3 14.2  HCT 43.2 42.3 43.1  MCV 90.0 88.1 88.9  PLT 107* 108* 123*   Lipid Panel: Recent Labs    04/06/22 0822 01/10/23 0929  CHOL 193 189  HDL 51 54  LDLCALC 116* 113*  TRIG 138 112  CHOLHDL 3.8 3.5   Lab Results  Component Value Date  HGBA1C 5.4 04/06/2022    Procedures since last visit: No results found.  Assessment/Plan 1. Diverticulitis She is having LLQ pain and low grade fever. Prior hx of diverticulitis No red flag symptoms of severe pain, vomiting, high fever, no rectal bleeding.  I recommend she start cipro and flagyl and f/u in the office in one week.  Recommend bland diet, encourage fluids Does not need further laxatives.  Advised to go to the ED for severe pain, high fever, bleeding, vomiting.   - ciprofloxacin (CIPRO) 500 MG tablet; Take 1 tablet (500 mg total) by mouth 2 (two) times daily for 7 days.  Dispense: 14 tablet; Refill: 0 - metroNIDAZOLE (FLAGYL) 500 MG tablet; Take 1 tablet (500 mg total) by mouth in the morning and at bedtime for 7 days. Advised no alcohol while on this med.  Dispense: 14 tablet; Refill: 0    Next appt:  1 week  f/u    I connected with Janalyn Harder on 1974/[birth year]/17 at  by video visit and verified  that I am speaking with the correct person using two identifiers.  I discussed the limitations, risks, security and privacy concerns of performing an evaluation and management service by video and the availability of in person appointments. I also discussed with the patient that there may be a patient responsible charge related to this service. The patient expressed understanding and agreed to proceed.   I discussed the assessment and treatment plan with the patient. The patient was provided an opportunity to  ask questions and all were answered. The patient agreed with the plan and demonstrated an understanding of the instructions.   The patient was advised to call back or seek an in-person evaluation if the symptoms worsen or if the condition fails to improve as anticipated.  I provided 12 minutes of face-to-face time during this encounter.

## 2023-02-28 ENCOUNTER — Ambulatory Visit: Payer: BC Managed Care – PPO | Admitting: Adult Health

## 2023-02-28 ENCOUNTER — Encounter: Payer: Self-pay | Admitting: Adult Health

## 2023-02-28 VITALS — BP 128/78 | HR 65 | Temp 98.1°F | Resp 18 | Ht 64.0 in | Wt 253.2 lb

## 2023-02-28 DIAGNOSIS — K219 Gastro-esophageal reflux disease without esophagitis: Secondary | ICD-10-CM

## 2023-02-28 DIAGNOSIS — D696 Thrombocytopenia, unspecified: Secondary | ICD-10-CM

## 2023-02-28 DIAGNOSIS — J302 Other seasonal allergic rhinitis: Secondary | ICD-10-CM

## 2023-02-28 DIAGNOSIS — G4700 Insomnia, unspecified: Secondary | ICD-10-CM | POA: Diagnosis not present

## 2023-02-28 DIAGNOSIS — F321 Major depressive disorder, single episode, moderate: Secondary | ICD-10-CM

## 2023-02-28 LAB — CBC WITH DIFFERENTIAL/PLATELET
Absolute Monocytes: 320 cells/uL (ref 200–950)
Basophils Absolute: 28 cells/uL (ref 0–200)
Basophils Relative: 0.6 %
Eosinophils Absolute: 108 cells/uL (ref 15–500)
Eosinophils Relative: 2.3 %
HCT: 41.3 % (ref 35.0–45.0)
Hemoglobin: 13.7 g/dL (ref 11.7–15.5)
Lymphs Abs: 1791 cells/uL (ref 850–3900)
MCH: 29.3 pg (ref 27.0–33.0)
MCHC: 33.2 g/dL (ref 32.0–36.0)
MCV: 88.4 fL (ref 80.0–100.0)
MPV: 10.5 fL (ref 7.5–12.5)
Monocytes Relative: 6.8 %
Neutro Abs: 2453 cells/uL (ref 1500–7800)
Neutrophils Relative %: 52.2 %
Platelets: 137 10*3/uL — ABNORMAL LOW (ref 140–400)
RBC: 4.67 10*6/uL (ref 3.80–5.10)
RDW: 12.6 % (ref 11.0–15.0)
Total Lymphocyte: 38.1 %
WBC: 4.7 10*3/uL (ref 3.8–10.8)

## 2023-02-28 MED ORDER — ZOLPIDEM TARTRATE 5 MG PO TABS
5.0000 mg | ORAL_TABLET | Freq: Every evening | ORAL | 0 refills | Status: DC | PRN
Start: 2023-02-28 — End: 2023-04-14

## 2023-02-28 MED ORDER — SEMAGLUTIDE-WEIGHT MANAGEMENT 0.25 MG/0.5ML ~~LOC~~ SOAJ
0.2500 mg | SUBCUTANEOUS | 1 refills | Status: DC
Start: 2023-02-28 — End: 2023-03-01

## 2023-02-28 NOTE — Progress Notes (Signed)
[facility]  Provider:  Kenard Gower DNP  Code Status:  Full Code  Goals of Care:     02/28/2023   10:52 AM  Advanced Directives  Does Patient Have a Medical Advance Directive? No  Would patient like information on creating a medical advance directive? No - Patient declined     Chief Complaint  Patient presents with   Follow-up    1 week follow up    Quality Metric Gaps    Needs to discuss Pap Smear-Modifier, Mammogram, DTAP and Covid vaccine.     HPI: Patient is a 47 y.o. female seen today for follow up of chronic medical issues.   Insomnia, unspecified type - stated that she does not feel rested when she wakes up, snores when sleeping, takes Ambien PRN, wakes up talking  Moderate major depression (HCC) -  "feels good about self", takes Zoloft  Morbidly obese (HCC) - Body mass index is 43.46 kg/m. , she walks 30 mins to an hour daily  Gastroesophageal reflux disease without esophagitis -  denies acid reflux, takes Protonix  Seasonal allergies -  takes Cetrizine  Thrombocytopenia (HCC) - has a bruise behind right knee, denies being aware of any recent trauma, platelet 123 (01/10/23)   Wt Readings from Last 3 Encounters:  02/28/23 253 lb 3.2 oz (114.9 kg)  01/10/23 253 lb 3.2 oz (114.9 kg)  07/02/22 250 lb (113.4 kg)     Past Medical History:  Diagnosis Date   Allergy    seasonal   Blood transfusion age 89yrs   Antibody scewwn neg. 08/20/10 prior to cholecystectomy   Cancer (HCC) 2018   2 spots melanoma removed under left breast   Cervical mass    Chronic back pain    Diverticulitis    Family history of malignant neoplasm of gastrointestinal tract    Fatty liver    Gastroparesis    GERD (gastroesophageal reflux disease)    Hepatomegaly    History of ovarian cyst    Scoliosis    Seizures (HCC)    post partum  eclampsia  22 yrs ago    Past Surgical History:  Procedure Laterality Date   BLADDER SUSPENSION  10/01/2011   Procedure: TRANSVAGINAL  TAPE (TVT) PROCEDURE;  Surgeon: Zenaida Niece, MD;  Location: [facility];  Service: Gynecology;  Laterality: N/A;  Solyx Sling   CERVICAL POLYPECTOMY N/A 11/21/2020   Procedure: CERVICAL POLYPECTOMY/REMOVAL OF MASS;  Surgeon: Lavina Hamman, MD;  Location: [facility] [place];  Service: Gynecology;  Laterality: N/A;   CHOLECYSTECTOMY  jan 2012   Dr Bertram Savin   CYSTOSCOPY  10/01/2011   Procedure: CYSTOSCOPY;  Surgeon: Zenaida Niece, MD;  Location: [facility];  Service: Gynecology;  Laterality: N/A;   INNER EAR SURGERY Left age 24   KNEE SURGERY Left age 69   arthroscopic   LAPAROSCOPY  10/01/2011   Procedure: LAPAROSCOPY OPERATIVE;  Surgeon: Zenaida Niece, MD;  Location: [facility];  Service: Gynecology;  Laterality: N/A;  removal of right fimbria, fulgeration of endometriosis   PERINEAL LACERATION REPAIR N/A 12/12/2020   Procedure: CERVICAL LACERATION REPAIR;  Surgeon: Carlisle Cater, MD;  Location: [facility];  Service: Gynecology;  Laterality: N/A;   Steel rod in her back  age 36   scoliosis   TONSILLECTOMY  as child   TUBAL LIGATION  april 31 2007   uterine ablation  2008   WISDOM TOOTH EXTRACTION      No Known Allergies  Outpatient Encounter  Medications as of 02/28/2023  Medication Sig   cetirizine (ZYRTEC) 10 MG tablet Take 10 mg by mouth at bedtime.   ciprofloxacin (CIPRO) 500 MG tablet Take 1 tablet (500 mg total) by mouth 2 (two) times daily for 7 days.   metroNIDAZOLE (FLAGYL) 500 MG tablet Take 1 tablet (500 mg total) by mouth in the morning and at bedtime for 7 days. Advised no alcohol while on this med.   NON FORMULARY Take 1 Piece by mouth as needed. 1 CBD gummy for sleep as needed.   pantoprazole (PROTONIX) 40 MG tablet Take 1 tablet (40 mg total) by mouth daily.   sertraline (ZOLOFT) 50 MG tablet Take 1 tablet (50 mg total) by mouth daily.   zolpidem (AMBIEN) 5 MG tablet Take 1 tablet (5 mg total) by mouth at bedtime as needed for sleep.   No facility-administered  encounter medications on file as of 02/28/2023.    Review of Systems:  Review of Systems  Constitutional:  Negative for appetite change, chills, fatigue and fever.  HENT:  Negative for congestion, hearing loss, rhinorrhea and sore throat.   Eyes: Negative.   Respiratory:  Negative for cough, shortness of breath and wheezing.   Cardiovascular:  Negative for chest pain, palpitations and leg swelling.  Gastrointestinal:  Negative for abdominal pain, constipation, diarrhea, nausea and vomiting.  Genitourinary:  Negative for dysuria.  Musculoskeletal:  Negative for arthralgias, back pain and myalgias.  Skin:  Positive for color change. Negative for rash and wound.  Neurological:  Negative for dizziness, weakness and headaches.  Psychiatric/Behavioral:  Positive for sleep disturbance. Negative for behavioral problems. The patient is not nervous/anxious.     Health Maintenance  Topic Date Due   PAP SMEAR-Modifier  Never done   DTaP/Tdap/Td (2 - Td or Tdap) 09/10/2019   MAMMOGRAM  12/14/2019   COVID-19 Vaccine (1 - 2023-24 season) Never done   INFLUENZA VACCINE  03/03/2023   Colonoscopy  09/12/2023   Hepatitis C Screening  Completed   HIV Screening  Completed   HPV VACCINES  Aged Out    Physical Exam: Vitals:   02/28/23 1051  BP: 128/78  Pulse: 65  Resp: 18  Temp: 98.1 F (36.7 C)  SpO2: 98%  Weight: 253 lb 3.2 oz (114.9 kg)  Height: 5\' 4"  (1.626 m)   Body mass index is 43.46 kg/m. Physical Exam Constitutional:      Appearance: She is obese.     Comments: Morbidly obese  HENT:     Head: Normocephalic and atraumatic.     Nose: Nose normal.     Mouth/Throat:     Mouth: Mucous membranes are moist.  Eyes:     Conjunctiva/sclera: Conjunctivae normal.  Cardiovascular:     Rate and Rhythm: Normal rate and regular rhythm.  Pulmonary:     Effort: Pulmonary effort is normal.     Breath sounds: Normal breath sounds.  Abdominal:     General: Bowel sounds are normal.      Palpations: Abdomen is soft.  Musculoskeletal:        General: Normal range of motion.     Cervical back: Normal range of motion.  Skin:    General: Skin is warm and dry.     Comments: Bruise behind right knee  Neurological:     General: No focal deficit present.     Mental Status: She is alert and oriented to person, place, and time.  Psychiatric:        Mood and  Affect: Mood normal.        Behavior: Behavior normal.        Thought Content: Thought content normal.        Judgment: Judgment normal.     Labs reviewed: Basic Metabolic Panel: Recent Labs    04/06/22 0822  NA 142  K 4.5  CL 105  CO2 26  GLUCOSE 84  BUN 11  CREATININE 0.91  CALCIUM 9.4   Liver Function Tests: Recent Labs    04/06/22 0822  AST 24  ALT 18  BILITOT 0.6  PROT 7.2   No results for input(s): "LIPASE", "AMYLASE" in the last 8760 hours. No results for input(s): "AMMONIA" in the last 8760 hours. CBC: Recent Labs    04/06/22 0822 07/02/22 0905 01/10/23 0929  WBC 4.2 4.6 4.5  NEUTROABS 1,928 2,433 2,246  HGB 14.2 14.3 14.2  HCT 43.2 42.3 43.1  MCV 90.0 88.1 88.9  PLT 107* 108* 123*   Lipid Panel: Recent Labs    04/06/22 0822 01/10/23 0929  CHOL 193 189  HDL 51 54  LDLCALC 116* 113*  TRIG 138 112  CHOLHDL 3.8 3.5   Lab Results  Component Value Date   HGBA1C 5.4 04/06/2022    Procedures since last visit: No results found.  Assessment/Plan  1. Insomnia, unspecified type - snores and talks when asleep - Ambulatory referral to Sleep Studies - zolpidem (AMBIEN) 5 MG tablet; Take 1 tablet (5 mg total) by mouth at bedtime as needed for sleep.  Dispense: 30 tablet; Refill: 0  2. Moderate major depression (HCC) -  mood is stable -  continue Zoloft  3. Morbidly obese (HCC) Body mass index is 43.46 kg/m. -  encouraged to do at least 150 minutes of moderate exercise - Semaglutide-Weight Management 0.25 MG/0.5ML SOAJ; Inject 0.25 mg into the skin once a week.  Dispense: 2  mL; Refill: 1  4. Gastroesophageal reflux disease without esophagitis -  stable -  continue Protonix  5. Seasonal allergies -  stable -  continue Cetirizine  6. Thrombocytopenia (HCC) Lab Results  Component Value Date   PLT 123 (L) 01/10/2023   -  has bruise behind right knee, denies trauma - CBC with Differential/Platelets     Labs/tests ordered:  CBC  Next appt:  04/14/2023

## 2023-03-01 ENCOUNTER — Telehealth: Payer: Self-pay

## 2023-03-01 MED ORDER — SEMAGLUTIDE-WEIGHT MANAGEMENT 0.25 MG/0.5ML ~~LOC~~ SOAJ: 0.2500 mg | SUBCUTANEOUS | 1 refills | Status: DC

## 2023-03-01 NOTE — Telephone Encounter (Signed)
Janalyn Harder (Key: [id number]) Rx #: [id number] YQMVHQ 0.25MG /0.5ML auto-injectors Form [employer] Electronic PA Form [phone] NCPDP)  PENDING

## 2023-03-01 NOTE — Progress Notes (Signed)
-    platelet 137, almost normal (normal 140-400), no anemia

## 2023-03-04 NOTE — Telephone Encounter (Signed)
Medication PA was DENIED! Sent Appeal Request to Plan

## 2023-03-14 NOTE — Telephone Encounter (Signed)
Appeal was DENIED.  [employer] Appeal Fax sent to [facility] for review.

## 2023-04-06 ENCOUNTER — Encounter: Payer: Self-pay | Admitting: Neurology

## 2023-04-06 ENCOUNTER — Ambulatory Visit (INDEPENDENT_AMBULATORY_CARE_PROVIDER_SITE_OTHER): Payer: BC Managed Care – PPO | Admitting: Neurology

## 2023-04-06 VITALS — BP 104/67 | HR 66 | Ht 62.0 in | Wt 252.0 lb

## 2023-04-06 DIAGNOSIS — R0683 Snoring: Secondary | ICD-10-CM | POA: Diagnosis not present

## 2023-04-06 DIAGNOSIS — G4719 Other hypersomnia: Secondary | ICD-10-CM | POA: Diagnosis not present

## 2023-04-06 DIAGNOSIS — Z9189 Other specified personal risk factors, not elsewhere classified: Secondary | ICD-10-CM | POA: Diagnosis not present

## 2023-04-06 DIAGNOSIS — Z820 Family history of epilepsy and other diseases of the nervous system: Secondary | ICD-10-CM

## 2023-04-06 DIAGNOSIS — Z6841 Body Mass Index (BMI) 40.0 and over, adult: Secondary | ICD-10-CM

## 2023-04-06 DIAGNOSIS — G478 Other sleep disorders: Secondary | ICD-10-CM

## 2023-04-06 DIAGNOSIS — G4700 Insomnia, unspecified: Secondary | ICD-10-CM

## 2023-04-06 NOTE — Patient Instructions (Signed)

## 2023-04-06 NOTE — Progress Notes (Signed)
Subjective:    Patient ID: Meghan Wilcox is a 47 y.o. female.  HPI    Huston Foley, MD, PhD [facility] [address], [address] [address] [place], [place] [address]  Dear Meghan Wilcox,  I saw your patient, Meghan Wilcox, upon your kind request in my sleep clinic today for initial consultation of her sleep disorder, in particular, concern for underlying obstructive sleep apnea.  The patient is unaccompanied today.  As you know, [name] is a 47 year old female with an underlying medical history of reflux disease, allergies, melanoma, diverticulitis, chronic back pain, history of gastroparesis, hepatomegaly, scoliosis, history of seizures (per chart review, in the context of postpartum eclampsia), depression, and severe obesity with a BMI of over 45, who reports snoring and excessive daytime somnolence, as well as difficulty sleeping at night.  Her Epworth sleepiness score is 7 out of 24, fatigue severity score is 46 out of 63.  She had a sleep study some 10 years ago but did not have sleep apnea at the time.  She has had interim weight gain over the course of years.  She is working on weight loss.  She lives with her husband and youngest daughter age 82.  She herself has 3 biological children and 2 stepchildren.  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  She was on Ambien for many years, took a break for several years and recently restarted at 5 mg strength.  She reports that the 5 mg Ambien does not help her, she has a longstanding history of difficulty falling asleep.  She has vivid dreams.  She has a history of sleep talking since childhood.  She admits to taking Ambien 10 mg at night along with CBD gummy.  Bedtime is generally between 830 and 9 PM and rise time is around 7:45 AM.  She does have a TV in the bedroom and they generally turn it off between 10 PM and 10:30 PM.  She has 2 dogs in the household, the  dogs do not sleep in her bedroom.  She has had occasional morning headaches, wonders if it is from jaw clenching or teeth grinding.  She quit smoking cigarettes about 4 years ago, drinks alcohol very occasionally, vapes nicotine daily.  She thinks caffeine occasionally.  Both parents had sleep apnea.  She does not have nightly nocturia. I reviewed your office note from 02/28/2023.    Her Past Medical History Is Significant For: Past Medical History:  Diagnosis Date   Allergy    seasonal   Blood transfusion age 41yrs   Antibody scewwn neg. 08/20/10 prior to cholecystectomy   Cancer (HCC) 2018   2 spots melanoma removed under left breast   Cervical mass    Chronic back pain    Diverticulitis    Family history of malignant neoplasm of gastrointestinal tract    Fatty liver    Gastroparesis    GERD (gastroesophageal reflux disease)    Hepatomegaly    History of ovarian cyst    Scoliosis    Seizures (HCC)    post partum  eclampsia  22 yrs ago    Her Past Surgical History Is Significant For: Past Surgical History:  Procedure Laterality Date   BLADDER SUSPENSION  10/01/2011   Procedure: TRANSVAGINAL TAPE (TVT) PROCEDURE;  Surgeon: Zenaida Niece, MD;  Location: [facility];  Service: Gynecology;  Laterality: N/A;  Solyx Sling   CERVICAL POLYPECTOMY N/A 11/21/2020  Procedure: CERVICAL POLYPECTOMY/REMOVAL OF MASS;  Surgeon: Lavina Hamman, MD;  Location[facility];  Service: Gynecology;  Laterality: N/A;   CHOLECYSTECTOMY  08/2010   Dr Bertram Savin   CHOLESTEATOMA EXCISION  1985   CYSTOSCOPY  10/01/2011   Procedure: CYSTOSCOPY;  Surgeon: Zenaida Niece, MD;  Location: [facility];  Service: Gynecology;  Laterality: N/A;   INNER EAR SURGERY Left age 76   KNEE SURGERY Left age 69   arthroscopic   LAPAROSCOPY  10/01/2011   Procedure: LAPAROSCOPY OPERATIVE;  Surgeon: Zenaida Niece, MD;  Location: [facility];  Service: Gynecology;  Laterality: N/A;  removal of right fimbria,  fulgeration of endometriosis   PERINEAL LACERATION REPAIR N/A 12/12/2020   Procedure: CERVICAL LACERATION REPAIR;  Surgeon: Carlisle Cater, MD;  Location: [facility];  Service: Gynecology;  Laterality: N/A;   Steel rod in her back  age 89   scoliosis   TONSILLECTOMY  as child   and adenoids   TUBAL LIGATION  april 31 2007   tubes in ears     childhood   uterine ablation  2008   WISDOM TOOTH EXTRACTION  2000    Her Family History Is Significant For: Family History  Problem Relation Age of Onset   Diabetes Mother    Cirrhosis Mother    Irritable bowel syndrome Mother    Crohn's disease Mother    Lung cancer Mother    Heart disease Mother    Sleep apnea Mother    Sleep apnea Father    Diabetes Father    Heart disease Father    Liver disease Father    Cirrhosis Father    Diabetes Brother    Colon cancer Paternal Grandfather 71   Colon cancer Other    Rectal cancer Other    Esophageal cancer Neg Hx    Stomach cancer Neg Hx     Her Social History Is Significant For: Social History   Socioeconomic History   Marital status: Married    Spouse name: Not on file   Number of children: 3   Years of education: Not on file   Highest education level: Not on file  Occupational History   Occupation: ADMIN    Employer: [employer]  Tobacco Use   Smoking status: Former    Current packs/day: 0.00    Average packs/day: 1 pack/day for 30.0 years (30.0 ttl pk-yrs)    Types: Cigarettes    Start date: 01/30/1990    Quit date: 01/31/2020    Years since quitting: 3.1   Smokeless tobacco: Never  Vaping Use   Vaping status: Every Day   Substances: Nicotine, CBD, Flavoring  Substance and Sexual Activity   Alcohol use: Yes    Comment: special occasions   Drug use: No   Sexual activity: Yes    Birth control/protection: Surgical  Other Topics Concern   Not on file  Social History Narrative   Caffeine: rarely   Lives at home with husband and child   Right handed   Social  Determinants of Health   Financial Resource Strain: Not on file  Food Insecurity: Not on file  Transportation Needs: Not on file  Physical Activity: Not on file  Stress: Not on file  Social Connections: Unknown (11/30/2021)   [facility]   Social Network    Social Network: Not on file    Her Allergies Are:  No Known Allergies:   Her Current Medications Are:  Outpatient Encounter Medications  as of 04/06/2023  Medication Sig   acetaminophen (TYLENOL) 500 MG tablet Take 1,000 mg by mouth at bedtime.   cetirizine (ZYRTEC) 10 MG tablet Take 10 mg by mouth at bedtime.   ibuprofen (ADVIL) 200 MG tablet Take 400-600 mg by mouth daily. Around lunchtime   NON FORMULARY Take 1 Piece by mouth as needed. 1 CBD gummy for sleep as needed.   pantoprazole (PROTONIX) 40 MG tablet Take 1 tablet (40 mg total) by mouth daily.   sertraline (ZOLOFT) 50 MG tablet Take 1 tablet (50 mg total) by mouth daily.   Semaglutide-Weight Management 0.25 MG/0.5ML SOAJ Inject 0.25 mg into the skin once a week. (Patient not taking: Reported on 04/06/2023)   zolpidem (AMBIEN) 5 MG tablet Take 1 tablet (5 mg total) by mouth at bedtime as needed for sleep. (Patient not taking: Reported on 04/06/2023)   No facility-administered encounter medications on file as of 04/06/2023.  :   Review of Systems:  Out of a complete 14 point review of systems, all are reviewed and negative with the exception of these symptoms as listed below:   Review of Systems  Neurological:        Patient is here alone for sleep consult. She states she is always exhausted. It also takes her forever to fall asleep. She states she doesn't sleep well. Last night, for example, she was lying down to sleep around 10 pm but still at 12:30 am she was awake. She also states that she tosses and turns all night. She has vivid dreams that she wakes herself up from. She talks in her sleep and wakes herself up. She had a sleep study at least 10 years ago.  She has been told she snores. ESS 7 FSS 46.    Objective:  Neurological Exam  Physical Exam Physical Examination:   Vitals:   04/06/23 1403  BP: 104/67  Pulse: 66    General Examination: The patient is a very pleasant 47 y.o. female in no acute distress. She appears well-developed and well-nourished and well groomed.   HEENT: Normocephalic, atraumatic, pupils are equal, round and reactive to light, extraocular tracking is good without limitation to gaze excursion or nystagmus noted. Hearing is grossly intact. Face is symmetric with normal facial animation. Speech is clear with no dysarthria noted. There is no hypophonia. There is no lip, neck/head, jaw or voice tremor. Neck is supple with full range of passive and active motion. There are no carotid bruits on auscultation. Oropharynx exam reveals: mild mouth dryness, good dental hygiene and mild airway crowding, due to small airway entry and slightly elongated uvula.  Mallampati class III.  Tonsils absent.  Neck circumference 17-7/8 inches, minimal overbite noted.  Tongue protrudes centrally and palate elevates symmetrically.  Chest: Clear to auscultation without wheezing, rhonchi or crackles noted.  Heart: S1+S2+0, regular and normal without murmurs, rubs or gallops noted.   Abdomen: Soft, non-tender and non-distended.  Extremities: There is no pitting edema in the distal lower extremities bilaterally.   Skin: Warm and dry without trophic changes noted.   Musculoskeletal: exam reveals no obvious joint deformities.   Neurologically:  Mental status: The patient is awake, alert and oriented in all 4 spheres. Her immediate and remote memory, attention, language skills and fund of knowledge are appropriate. There is no evidence of aphasia, agnosia, apraxia or anomia. Speech is clear with normal prosody and enunciation. Thought process is linear. Mood is normal and affect is normal.  Cranial nerves II - XII  are as described above under  HEENT exam.  Motor exam: Normal bulk, strength and tone is noted. There is no obvious action or resting tremor.  Fine motor skills and coordination: grossly intact.  Cerebellar testing: No dysmetria or intention tremor. There is no truncal or gait ataxia.  Sensory exam: intact to light touch in the upper and lower extremities.  Gait, station and balance: She stands easily. No veering to one side is noted. No leaning to one side is noted. Posture is age-appropriate and stance is narrow based. Gait shows normal stride length and normal pace. No problems turning are noted.   Assessment and Plan:  In summary, OTISHA DAVIGNON is a very pleasant 47 y.o.-year old female with an underlying medical history of reflux disease, allergies, melanoma, diverticulitis, chronic back pain, history of gastroparesis, hepatomegaly, scoliosis, history of seizures (per chart review, in the context of postpartum eclampsia), depression, and severe obesity with a BMI of over 45, whose history and physical exam are concerning for sleep disordered breathing, particularly obstructive sleep apnea (OSA) versus upper airway resistance syndrome (UARS) versus central sleep apnea (CSA), or mixed sleep apnea. A laboratory attended sleep study is typically considered "gold standard" for evaluation of sleep disordered breathing.   I had a long chat with the patient about my findings and the diagnosis of sleep apnea, particularly OSA, its prognosis and treatment options. We talked about medical/conservative treatments, surgical interventions and non-pharmacological approaches for symptom control. I explained, in particular, the risks and ramifications of untreated moderate to severe OSA, especially with respect to developing cardiovascular disease down the road, including congestive heart failure (CHF), difficult to treat hypertension, cardiac arrhythmias (particularly A-fib), neurovascular complications including TIA, stroke and dementia. Even  type 2 diabetes has, in part, been linked to untreated OSA. Symptoms of untreated OSA may include (but may not be limited to) daytime sleepiness, nocturia (i.e. frequent nighttime urination), memory problems, mood irritability and suboptimally controlled or worsening mood disorder such as depression and/or anxiety, lack of energy, lack of motivation, physical discomfort, as well as recurrent headaches, especially morning or nocturnal headaches. We talked about the importance of maintaining a healthy lifestyle and striving for healthy weight.  The importance of complete nicotine cessation was also addressed.  In addition, we talked about the importance of striving for and maintaining good sleep hygiene. I recommended a sleep study at this time. I outlined the differences between a laboratory attended sleep study which is considered more comprehensive and accurate over the option of a home sleep test (HST); the latter may lead to underestimation of sleep disordered breathing in some instances and does not help with diagnosing upper airway resistance syndrome and is not accurate enough to diagnose primary central sleep apnea typically. I outlined possible surgical and non-surgical treatment options of OSA, including the use of a positive airway pressure (PAP) device (i.e. CPAP, AutoPAP/APAP or BiPAP in certain circumstances), a custom-made dental device (aka oral appliance, which would require a referral to a specialist dentist or orthodontist typically, and is generally speaking not considered for patients with full dentures or edentulous state), upper airway surgical options, such as traditional UPPP (which is not considered a first-line treatment) or the Inspire device (hypoglossal nerve stimulator, which would involve a referral for consultation with an ENT surgeon, after careful selection, following inclusion criteria - also not first-line treatment). I explained the PAP treatment option to the patient in  detail, as this is generally considered first-line treatment.  The patient indicated that  she would be willing to try PAP therapy, if the need arises. I explained the importance of being compliant with PAP treatment, not only for insurance purposes but primarily to improve patient's symptoms symptoms, and for the patient's long term health benefit, including to reduce Her cardiovascular risks longer-term.    We will pick up our discussion about the next steps and treatment options after testing.  We will keep her posted as to the test results by phone call and/or MyChart messaging where possible.  We will plan to follow-up in sleep clinic accordingly as well.  I answered all her questions today and the patient was in agreement.   I encouraged her to call with any interim questions, concerns, problems or updates or email [place] through MyChart.  Generally speaking, sleep test authorizations may take up to 2 weeks, sometimes less, sometimes longer, the patient is encouraged to get in touch with [place] if they do not hear back from the sleep lab staff directly within the next 2 weeks.  Thank you very much for allowing me to participate in the care of this nice patient. If I can be of any further assistance to you please do not hesitate to call me at [phone].  Sincerely,   Huston Foley, MD, PhD

## 2023-04-07 ENCOUNTER — Other Ambulatory Visit: Payer: Self-pay

## 2023-04-07 DIAGNOSIS — G4700 Insomnia, unspecified: Secondary | ICD-10-CM

## 2023-04-07 NOTE — Telephone Encounter (Signed)
Refill request received from [facility] for Meghan Wilcox. Medication last prescribed 02/28/23. There is no agreement on file.  Medication pended and sent to [facility], NP to approve/refuse.

## 2023-04-14 ENCOUNTER — Encounter: Payer: Self-pay | Admitting: Adult Health

## 2023-04-14 ENCOUNTER — Ambulatory Visit (INDEPENDENT_AMBULATORY_CARE_PROVIDER_SITE_OTHER): Payer: BC Managed Care – PPO | Admitting: Adult Health

## 2023-04-14 VITALS — BP 128/78 | HR 63 | Temp 98.0°F | Resp 18 | Ht 62.0 in | Wt 253.6 lb

## 2023-04-14 DIAGNOSIS — G4700 Insomnia, unspecified: Secondary | ICD-10-CM | POA: Diagnosis not present

## 2023-04-14 DIAGNOSIS — K219 Gastro-esophageal reflux disease without esophagitis: Secondary | ICD-10-CM

## 2023-04-14 DIAGNOSIS — F321 Major depressive disorder, single episode, moderate: Secondary | ICD-10-CM

## 2023-04-14 DIAGNOSIS — J302 Other seasonal allergic rhinitis: Secondary | ICD-10-CM

## 2023-04-14 DIAGNOSIS — Z2821 Immunization not carried out because of patient refusal: Secondary | ICD-10-CM

## 2023-04-14 MED ORDER — ZOLPIDEM TARTRATE 5 MG PO TABS
10.0000 mg | ORAL_TABLET | Freq: Every evening | ORAL | 0 refills | Status: DC | PRN
Start: 2023-04-14 — End: 2023-06-24

## 2023-04-14 NOTE — Patient Instructions (Signed)
Calorie Counting for Weight Loss Calories are units of energy. Your body needs a certain number of calories from food to keep going throughout the day. When you eat or drink more calories than your body needs, your body stores the extra calories mostly as fat. When you eat or drink fewer calories than your body needs, your body burns fat to get the energy it needs. Calorie counting means keeping track of how many calories you eat and drink each day. Calorie counting can be helpful if you need to lose weight. If you eat fewer calories than your body needs, you should lose weight. Ask your health care provider what a healthy weight is for you. For calorie counting to work, you will need to eat the right number of calories each day to lose a healthy amount of weight per week. A dietitian can help you figure out how many calories you need in a day and will suggest ways to reach your calorie goal. A healthy amount of weight to lose each week is usually 1-2 lb (0.5-0.9 kg). This usually means that your daily calorie intake should be reduced by 500-750 calories. Eating 1,200-1,500 calories a day can help most women lose weight. Eating 1,500-1,800 calories a day can help most men lose weight. What do I need to know about calorie counting? Work with your health care provider or dietitian to determine how many calories you should get each day. To meet your daily calorie goal, you will need to: Find out how many calories are in each food that you would like to eat. Try to do this before you eat. Decide how much of the food you plan to eat. Keep a food log. Do this by writing down what you ate and how many calories it had. To successfully lose weight, it is important to balance calorie counting with a healthy lifestyle that includes regular activity. Where do I find calorie information?  The number of calories in a food can be found on a Nutrition Facts label. If a food does not have a Nutrition Facts label, try  to look up the calories online or ask your dietitian for help. Remember that calories are listed per serving. If you choose to have more than one serving of a food, you will have to multiply the calories per serving by the number of servings you plan to eat. For example, the label on a package of bread might say that a serving size is 1 slice and that there are 90 calories in a serving. If you eat 1 slice, you will have eaten 90 calories. If you eat 2 slices, you will have eaten 180 calories. How do I keep a food log? After each time that you eat, record the following in your food log as soon as possible: What you ate. Be sure to include toppings, sauces, and other extras on the food. How much you ate. This can be measured in cups, ounces, or number of items. How many calories were in each food and drink. The total number of calories in the food you ate. Keep your food log near you, such as in a pocket-sized notebook or on an app or website on your mobile phone. Some programs will calculate calories for you and show you how many calories you have left to meet your daily goal. What are some portion-control tips? Know how many calories are in a serving. This will help you know how many servings you can have of a certain   food. Use a measuring cup to measure serving sizes. You could also try weighing out portions on a kitchen scale. With time, you will be able to estimate serving sizes for some foods. Take time to put servings of different foods on your favorite plates or in your favorite bowls and cups so you know what a serving looks like. Try not to eat straight from a food's packaging, such as from a bag or box. Eating straight from the package makes it hard to see how much you are eating and can lead to overeating. Put the amount you would like to eat in a cup or on a plate to make sure you are eating the right portion. Use smaller plates, glasses, and bowls for smaller portions and to prevent  overeating. Try not to multitask. For example, avoid watching TV or using your computer while eating. If it is time to eat, sit down at a table and enjoy your food. This will help you recognize when you are full. It will also help you be more mindful of what and how much you are eating. What are tips for following this plan? Reading food labels Check the calorie count compared with the serving size. The serving size may be smaller than what you are used to eating. Check the source of the calories. Try to choose foods that are high in protein, fiber, and vitamins, and low in saturated fat, trans fat, and sodium. Shopping Read nutrition labels while you shop. This will help you make healthy decisions about which foods to buy. Pay attention to nutrition labels for low-fat or fat-free foods. These foods sometimes have the same number of calories or more calories than the full-fat versions. They also often have added sugar, starch, or salt to make up for flavor that was removed with the fat. Make a grocery list of lower-calorie foods and stick to it. Cooking Try to cook your favorite foods in a healthier way. For example, try baking instead of frying. Use low-fat dairy products. Meal planning Use more fruits and vegetables. One-half of your plate should be fruits and vegetables. Include lean proteins, such as chicken, turkey, and fish. Lifestyle Each week, aim to do one of the following: 150 minutes of moderate exercise, such as walking. 75 minutes of vigorous exercise, such as running. General information Know how many calories are in the foods you eat most often. This will help you calculate calorie counts faster. Find a way of tracking calories that works for you. Get creative. Try different apps or programs if writing down calories does not work for you. What foods should I eat?  Eat nutritious foods. It is better to have a nutritious, high-calorie food, such as an avocado, than a food with  few nutrients, such as a bag of potato chips. Use your calories on foods and drinks that will fill you up and will not leave you hungry soon after eating. Examples of foods that fill you up are nuts and nut butters, vegetables, lean proteins, and high-fiber foods such as whole grains. High-fiber foods are foods with more than 5 g of fiber per serving. Pay attention to calories in drinks. Low-calorie drinks include water and unsweetened drinks. The items listed above may not be a complete list of foods and beverages you can eat. Contact a dietitian for more information. What foods should I limit? Limit foods or drinks that are not good sources of vitamins, minerals, or protein or that are high in unhealthy fats. These   include: Candy. Other sweets. Sodas, specialty coffee drinks, alcohol, and juice. The items listed above may not be a complete list of foods and beverages you should avoid. Contact a dietitian for more information. How do I count calories when eating out? Pay attention to portions. Often, portions are much larger when eating out. Try these tips to keep portions smaller: Consider sharing a meal instead of getting your own. If you get your own meal, eat only half of it. Before you start eating, ask for a container and put half of your meal into it. When available, consider ordering smaller portions from the menu instead of full portions. Pay attention to your food and drink choices. Knowing the way food is cooked and what is included with the meal can help you eat fewer calories. If calories are listed on the menu, choose the lower-calorie options. Choose dishes that include vegetables, fruits, whole grains, low-fat dairy products, and lean proteins. Choose items that are boiled, broiled, grilled, or steamed. Avoid items that are buttered, battered, fried, or served with cream sauce. Items labeled as crispy are usually fried, unless stated otherwise. Choose water, low-fat milk,  unsweetened iced tea, or other drinks without added sugar. If you want an alcoholic beverage, choose a lower-calorie option, such as a glass of wine or light beer. Ask for dressings, sauces, and syrups on the side. These are usually high in calories, so you should limit the amount you eat. If you want a salad, choose a garden salad and ask for grilled meats. Avoid extra toppings such as bacon, cheese, or fried items. Ask for the dressing on the side, or ask for olive oil and vinegar or lemon to use as dressing. Estimate how many servings of a food you are given. Knowing serving sizes will help you be aware of how much food you are eating at restaurants. Where to find more information Centers for Disease Control and Prevention: www.cdc.gov U.[place]. Department of Agriculture: myplate.gov Summary Calorie counting means keeping track of how many calories you eat and drink each day. If you eat fewer calories than your body needs, you should lose weight. A healthy amount of weight to lose per week is usually 1-2 lb (0.5-0.9 kg). This usually means reducing your daily calorie intake by 500-750 calories. The number of calories in a food can be found on a Nutrition Facts label. If a food does not have a Nutrition Facts label, try to look up the calories online or ask your dietitian for help. Use smaller plates, glasses, and bowls for smaller portions and to prevent overeating. Use your calories on foods and drinks that will fill you up and not leave you hungry shortly after a meal. This information is not intended to replace advice given to you by your health care provider. Make sure you discuss any questions you have with your health care provider. Document Revised: 08/30/2019 Document Reviewed: 08/30/2019 Elsevier Patient Education  2023 Elsevier Inc.  

## 2023-04-14 NOTE — Progress Notes (Signed)
[facility] clinic  Provider :  Kenard Gower DNP  Code Status:  Full Code  Goals of Care:     02/28/2023   10:52 AM  Advanced Directives  Does Patient Have a Medical Advance Directive? No  Would patient like information on creating a medical advance directive? No - Patient declined     Chief Complaint  Patient presents with   Follow-up    3 month f/u   Immunizations    DTAP, Influenza, Covid   Quality Metric Gaps    Mammogram and Pap Smear-Modifier     HPI: Patient is a 47 y.o. female seen today for a 7-month follow up of chronic medical issues. She will follow up with her Gynecology for pap smear and schedule mammogram. Discussed about needing Tdap and COVID-19 vaccine.  Insomnia, unspecified type - has been taking 2 tabs of Ambien 5 mg = 10 mg at night, sleeps 4-6 hours at night  Moderate major depression (HCC) -  feels "fine", takes Sertraline  Gastroesophageal reflux disease without esophagitis -  denies acid reflux, takes Protonix   Morbidly obese (HCC) -  will call her medical insurance for her Semaglutide to be approved Wt Readings from Last 3 Encounters:  04/14/23 253 lb 9.6 oz (115 kg)  04/06/23 252 lb (114.3 kg)  02/28/23 253 lb 3.2 oz (114.9 kg)     Past Medical History:  Diagnosis Date   Allergy    seasonal   Blood transfusion age 60yrs   Antibody scewwn neg. 08/20/10 prior to cholecystectomy   Cancer (HCC) 2018   2 spots melanoma removed under left breast   Cervical mass    Chronic back pain    Diverticulitis    Family history of malignant neoplasm of gastrointestinal tract    Fatty liver    Gastroparesis    GERD (gastroesophageal reflux disease)    Hepatomegaly    History of ovarian cyst    Scoliosis    Seizures (HCC)    post partum  eclampsia  22 yrs ago    Past Surgical History:  Procedure Laterality Date   BLADDER SUSPENSION  10/01/2011   Procedure: TRANSVAGINAL TAPE (TVT) PROCEDURE;  Surgeon: Zenaida Niece, MD;  Location: [facility];  Service: Gynecology;  Laterality: N/A;  Solyx Sling   CERVICAL POLYPECTOMY N/A 11/21/2020   Procedure: CERVICAL POLYPECTOMY/REMOVAL OF MASS;  Surgeon: Lavina Hamman, MD;  Location: [facility] [place];  Service: Gynecology;  Laterality: N/A;   CHOLECYSTECTOMY  08/2010   Dr Bertram Savin   CHOLESTEATOMA EXCISION  1985   CYSTOSCOPY  10/01/2011   Procedure: CYSTOSCOPY;  Surgeon: Zenaida Niece, MD;  Location: [facility];  Service: Gynecology;  Laterality: N/A;   INNER EAR SURGERY Left age 42   KNEE SURGERY Left age 62   arthroscopic   LAPAROSCOPY  10/01/2011   Procedure: LAPAROSCOPY OPERATIVE;  Surgeon: Zenaida Niece, MD;  Location: [facility];  Service: Gynecology;  Laterality: N/A;  removal of right fimbria, fulgeration of endometriosis   PERINEAL LACERATION REPAIR N/A 12/12/2020   Procedure: CERVICAL LACERATION REPAIR;  Surgeon: Carlisle Cater, MD;  Location: [facility];  Service: Gynecology;  Laterality: N/A;   Steel rod in her back  age 68   scoliosis   TONSILLECTOMY  as child   and adenoids   TUBAL LIGATION  april 31 2007   tubes in ears     childhood   uterine ablation  2008   WISDOM TOOTH EXTRACTION  2000  No Known Allergies  Outpatient Encounter Medications as of 04/14/2023  Medication Sig   acetaminophen (TYLENOL) 500 MG tablet Take 1,000 mg by mouth at bedtime.   cetirizine (ZYRTEC) 10 MG tablet Take 10 mg by mouth at bedtime.   ibuprofen (ADVIL) 200 MG tablet Take 400-600 mg by mouth daily. Around lunchtime   NON FORMULARY Take 1 Piece by mouth as needed. 1 CBD gummy for sleep as needed.   pantoprazole (PROTONIX) 40 MG tablet Take 1 tablet (40 mg total) by mouth daily.   sertraline (ZOLOFT) 50 MG tablet Take 1 tablet (50 mg total) by mouth daily.   zolpidem (AMBIEN) 5 MG tablet Take 2 tablets (10 mg total) by mouth at bedtime as needed for sleep.   Semaglutide-Weight Management 0.25 MG/0.5ML SOAJ Inject 0.25 mg into the skin once a week. (Patient not taking:  Reported on 04/06/2023)   [DISCONTINUED] zolpidem (AMBIEN) 5 MG tablet Take 1 tablet (5 mg total) by mouth at bedtime as needed for sleep. (Patient not taking: Reported on 04/06/2023)   No facility-administered encounter medications on file as of 04/14/2023.    Review of Systems:  Review of Systems  Constitutional:  Negative for appetite change, chills, fatigue and fever.  HENT:  Negative for congestion, hearing loss, rhinorrhea and sore throat.   Eyes: Negative.   Respiratory:  Negative for cough, shortness of breath and wheezing.   Cardiovascular:  Negative for chest pain, palpitations and leg swelling.  Gastrointestinal:  Negative for abdominal pain, constipation, diarrhea, nausea and vomiting.  Genitourinary:  Negative for dysuria.  Musculoskeletal:  Negative for arthralgias, back pain and myalgias.  Skin:  Negative for color change, rash and wound.  Neurological:  Negative for dizziness, weakness and headaches.  Psychiatric/Behavioral:  Negative for behavioral problems. The patient is not nervous/anxious.     Health Maintenance  Topic Date Due   PAP SMEAR-Modifier  Never done   DTaP/Tdap/Td (2 - Td or Tdap) 09/10/2019   MAMMOGRAM  12/14/2019   INFLUENZA VACCINE  03/03/2023   COVID-19 Vaccine (1 - 2023-24 season) Never done   Colonoscopy  09/12/2023   Hepatitis C Screening  Completed   HIV Screening  Completed   HPV VACCINES  Aged Out    Physical Exam: Vitals:   04/14/23 0846  BP: 128/78  Pulse: 63  Resp: 18  Temp: 98 F (36.7 C)  SpO2: 94%  Weight: 253 lb 9.6 oz (115 kg)  Height: 5\' 2"  (1.575 m)   Body mass index is 46.38 kg/m. Physical Exam Constitutional:      General: She is not in acute distress.    Appearance: She is obese.     Comments: Morbidly obese  HENT:     Head: Normocephalic and atraumatic.     Nose: Nose normal.     Mouth/Throat:     Mouth: Mucous membranes are moist.  Eyes:     Conjunctiva/sclera: Conjunctivae normal.  Cardiovascular:      Rate and Rhythm: Normal rate and regular rhythm.  Pulmonary:     Effort: Pulmonary effort is normal.     Breath sounds: Normal breath sounds.  Abdominal:     General: Bowel sounds are normal.     Palpations: Abdomen is soft.  Musculoskeletal:        General: Normal range of motion.     Cervical back: Normal range of motion.  Skin:    General: Skin is warm and dry.  Neurological:     General: No focal deficit present.  Mental Status: She is alert and oriented to person, place, and time.  Psychiatric:        Mood and Affect: Mood normal.        Behavior: Behavior normal.        Thought Content: Thought content normal.        Judgment: Judgment normal.     Labs reviewed: Basic Metabolic Panel: No results for input(s): "NA", "K", "CL", "CO2", "GLUCOSE", "BUN", "CREATININE", "CALCIUM", "MG", "PHOS", "TSH" in the last 8760 hours. Liver Function Tests: No results for input(s): "AST", "ALT", "ALKPHOS", "BILITOT", "PROT", "ALBUMIN" in the last 8760 hours. No results for input(s): "LIPASE", "AMYLASE" in the last 8760 hours. No results for input(s): "AMMONIA" in the last 8760 hours. CBC: Recent Labs    07/02/22 0905 01/10/23 0929 02/28/23 1144  WBC 4.6 4.5 4.7  NEUTROABS 2,433 2,246 2,453  HGB 14.3 14.2 13.7  HCT 42.3 43.1 41.3  MCV 88.1 88.9 88.4  PLT 108* 123* 137*   Lipid Panel: Recent Labs    01/10/23 0929  CHOL 189  HDL 54  LDLCALC 113*  TRIG 112  CHOLHDL 3.5   Lab Results  Component Value Date   HGBA1C 5.4 04/06/2022    Procedures since last visit: No results found.  Assessment/Plan  1. Insomnia, unspecified type -  sleeps 4-6 hours/night -  will increase Ambien from 5 mg to 10 mg at night PRN - zolpidem (AMBIEN) 5 MG tablet; Take 2 tablets (10 mg total) by mouth at bedtime as needed for sleep.  Dispense: 60 tablet; Refill: 0  2. Moderate major depression (HCC) -  feels "fine" -  continue Sertraline  3. Gastroesophageal reflux disease without  esophagitis - stable -  continue Protonix  4. Seasonal allergies -  continue Zyrtec  5. Morbidly obese (HCC) Body mass index is 46.38 kg/m.  -  Semaglutide denied by medical insurance and she plans to call them for approval  6.  Flu vaccine refused -  declined vaccine    Labs/tests ordered:  None  Next appt:  Visit date not found

## 2023-04-18 ENCOUNTER — Telehealth: Payer: Self-pay | Admitting: Neurology

## 2023-04-18 NOTE — Telephone Encounter (Signed)
[employer]- [employer] no auth req spoke to [place] ref # [id number].  Sent mychart message to the patient.

## 2023-04-25 ENCOUNTER — Other Ambulatory Visit: Payer: Self-pay | Admitting: *Deleted

## 2023-04-25 DIAGNOSIS — K219 Gastro-esophageal reflux disease without esophagitis: Secondary | ICD-10-CM

## 2023-04-25 MED ORDER — PANTOPRAZOLE SODIUM 40 MG PO TBEC
40.0000 mg | DELAYED_RELEASE_TABLET | Freq: Every day | ORAL | 1 refills | Status: DC
Start: 2023-04-25 — End: 2023-11-04

## 2023-04-25 NOTE — Telephone Encounter (Signed)
[facility] requested refill.

## 2023-05-17 ENCOUNTER — Other Ambulatory Visit: Payer: Self-pay | Admitting: Adult Health

## 2023-05-17 DIAGNOSIS — F321 Major depressive disorder, single episode, moderate: Secondary | ICD-10-CM

## 2023-05-17 NOTE — Telephone Encounter (Signed)
Patient medication has High Risk Warnings. Medication pend and sent to PCP [facility]-Vargas, Margit Banda, NP

## 2023-05-23 ENCOUNTER — Telehealth: Payer: Managed Care, Other (non HMO)

## 2023-05-23 ENCOUNTER — Telehealth: Payer: Self-pay

## 2023-05-23 DIAGNOSIS — K5732 Diverticulitis of large intestine without perforation or abscess without bleeding: Secondary | ICD-10-CM | POA: Diagnosis not present

## 2023-05-23 DIAGNOSIS — K7460 Unspecified cirrhosis of liver: Secondary | ICD-10-CM | POA: Diagnosis not present

## 2023-05-23 DIAGNOSIS — R161 Splenomegaly, not elsewhere classified: Secondary | ICD-10-CM | POA: Diagnosis not present

## 2023-05-23 DIAGNOSIS — R110 Nausea: Secondary | ICD-10-CM | POA: Diagnosis not present

## 2023-05-23 DIAGNOSIS — R1084 Generalized abdominal pain: Secondary | ICD-10-CM | POA: Diagnosis not present

## 2023-05-23 NOTE — Telephone Encounter (Signed)
Patient called and states that she's been having Diverticulitis issues this weekend. Patient placed on Dr.Veludandi schedule for tomorrow 05/24/2023.

## 2023-05-23 NOTE — Telephone Encounter (Signed)
Patient called this morning stating she had Diverticulitis flare and wanted to be seen tomorrow 05/24/2023 by Dr.Veludandi. Patient called back later in the day and left voicemail stating that she couldn't wait and she wanted to know which urgent care/hospital we recommend her visiting. I called patient back and she states that she's at [facility] urgent care. Appointment cancelled.

## 2023-05-24 ENCOUNTER — Encounter (INDEPENDENT_AMBULATORY_CARE_PROVIDER_SITE_OTHER): Payer: BC Managed Care – PPO | Admitting: Sports Medicine

## 2023-05-27 NOTE — Progress Notes (Signed)
This encounter was created in error - please disregard.

## 2023-05-30 NOTE — Telephone Encounter (Signed)
I spoke with the patient and she took the cancellation I had for 07/01/23 at 9 pm. She will be here that night.

## 2023-05-31 ENCOUNTER — Ambulatory Visit (INDEPENDENT_AMBULATORY_CARE_PROVIDER_SITE_OTHER): Payer: BC Managed Care – PPO | Admitting: Neurology

## 2023-05-31 DIAGNOSIS — R0683 Snoring: Secondary | ICD-10-CM

## 2023-05-31 DIAGNOSIS — G4733 Obstructive sleep apnea (adult) (pediatric): Secondary | ICD-10-CM | POA: Diagnosis not present

## 2023-05-31 DIAGNOSIS — G478 Other sleep disorders: Secondary | ICD-10-CM

## 2023-05-31 DIAGNOSIS — G4719 Other hypersomnia: Secondary | ICD-10-CM

## 2023-05-31 DIAGNOSIS — G4720 Circadian rhythm sleep disorder, unspecified type: Secondary | ICD-10-CM

## 2023-05-31 DIAGNOSIS — Z820 Family history of epilepsy and other diseases of the nervous system: Secondary | ICD-10-CM

## 2023-05-31 DIAGNOSIS — G4700 Insomnia, unspecified: Secondary | ICD-10-CM

## 2023-05-31 DIAGNOSIS — Z9189 Other specified personal risk factors, not elsewhere classified: Secondary | ICD-10-CM

## 2023-06-02 NOTE — Procedures (Signed)
Physician Interpretation:     [facility] POLYSOMNOGRAPHY  INTERPRETATION REPORT   STUDY DATE:  05/31/2023     PATIENT NAME:  Meghan Wilcox         DATE OF BIRTH:  01-Oct-1975  PATIENT ID:  [id number]    TYPE OF STUDY:  PSG  READING PHYSICIAN: Huston Foley, MD, PhD   SCORING TECHNICIAN: Margaretann Loveless, RPSGT   Referred by: Gillis Santa, NP  ? History and Indication for Testing: 47 year old female with an underlying medical history of reflux disease, allergies, melanoma, diverticulitis, chronic back pain, history of gastroparesis, hepatomegaly, scoliosis, history of seizures (per chart review, in the context of postpartum eclampsia), depression, and severe obesity with a BMI of over 45, who reports snoring and excessive daytime somnolence, as well as difficulty sleeping at night. Her Epworth sleepiness score is 7 out of 24, fatigue severity score is 46 out of 63.  Height: 62 in Weight: 252 lb (BMI 46) Neck Size: 18 in    MEDICATIONS: Tylenol, Zyrtec, Advil, Protonix, Zoloft, CBD Gummy   TECHNICAL DESCRIPTION: A registered sleep technologist  was in attendance for the duration of the recording.  Data collection, scoring, video monitoring, and reporting were performed in compliance with the AASM Manual for the Scoring of Sleep and Associated Events; (Hypopnea is scored based on the criteria listed in Section VIII D. 1b in the AASM Manual V2.6 using a 4% oxygen desaturation rule or Hypopnea is scored based on the criteria listed in Section VIII D. 1a in the AASM Manual V2.6 using 3% oxygen desaturation and /or arousal rule).   SLEEP CONTINUITY AND SLEEP ARCHITECTURE:  Lights-out was at 22:14: and lights-on at  05:00:, with a total recording time of 6 hours, 46 min. The patient took Ambien, Zoloft, tylenol, Rest EZ and Delta-8 prior to study start. Total sleep time ( TST) was 290.5 minutes with a decreased sleep efficiency at 71.6%. There was  19.4% REM  sleep.   BODY POSITION:  TST was divided  between the following sleep positions: 53.2% supine;  46.8% lateral;  0% prone. Duration of total sleep and percent of total sleep in their respective position is as follows: supine 154 minutes (53%), non-supine 136 minutes (47%); right 136 minutes (47%), left 00 minutes (0%), and prone 00 minutes (0%).  Total supine REM sleep time was 00 minutes (0% of total REM sleep).  Sleep latency was increased at 111.5 minutes.  REM sleep latency was increased at 168.0 minutes. Of the total sleep time, the percentage of stage N1 sleep was 0.9%, stage N2 sleep was 53%, which is normal, stage N3 sleep was 26.5%, which is increased and REM sleep was 19.4%, which is (near) normal. Wake after sleep onset (WASO) time accounted for 4 minutes.   RESPIRATORY MONITORING:   Based on CMS criteria (using a 4% oxygen desaturation rule for scoring hypopneas), there were 4 apneas (3 obstructive; 1 central; 0 mixed), and 120 hypopneas.  Apnea index was 0.8. Hypopnea index was 24.8. The apnea-hypopnea index was 25.6 overall (25.6 supine, 47 non-supine; 46.7 REM, 0.0 supine REM).  There were 0 respiratory effort-related arousals (RERAs).  The RERA index was 0 events/h. Total respiratory disturbance index (RDI) was 25.6 events/h. RDI results showed: supine RDI  25.6 /h; non-supine RDI 25.6 /h; REM RDI 46.7 /h, supine REM RDI 0.0 /h.   Based on AASM criteria (using a 3% oxygen desaturation and /or arousal rule for scoring hypopneas), there were 4 apneas (  3 obstructive; 1 central; 0 mixed), and 162 hypopneas. Apnea index was 0.8. Hypopnea index was 33.5. The apnea-hypopnea index was 34.3/hour overall (38.1 supine, 51 non-supine; 51.0 REM, 0.0 supine REM).  There were 0 respiratory effort-related arousals (RERAs).  The RERA index was 0 events/h. Total respiratory disturbance index (RDI) was 34.3 events/h. RDI results showed: supine RDI  38.1 /h; non-supine RDI 30.0 /h; REM RDI 51.0 /h, supine  REM RDI 0.0 /h.    OXIMETRY: Oxyhemoglobin Saturation Nadir during sleep was at  70% from a mean of 91%.  Of the Total sleep time (TST)   hypoxemia (=<88%) was present for  52.8 minutes, or 18.2% of total sleep time.    LIMB MOVEMENTS: There were 0 periodic limb movements of sleep (0.0/hr), of which 0 (0.0/hr) were associated with an arousal.   AROUSAL: There were 73 arousals in total, for an arousal index of 15 arousals/hour.  Of these, 39 were identified as respiratory-related arousals (8 /h), 0 were PLM-related arousals (0 /h), and 36 were non-specific arousals (7 /h).   EEG: Review of the EEG showed no abnormal electrical discharges and symmetrical bihemispheric findings.    EKG: The EKG revealed normal sinus rhythm (NSR). The average heart rate during sleep was 71 bpm.  AUDIO/VIDEO REVIEW: The audio and video review did not show any abnormal or unusual behaviors, movements, phonations or vocalizations with the exception of brief, rare, unintelligible mumbling or phonations noted. The patient took no restroom breaks. Snoring was noted, ranging from mild to louder.  POST-STUDY QUESTIONNAIRE: Post study, the patient indicated, that sleep was the same as usual.   IMPRESSION:  1. Severe Obstructive Sleep Apnea (OSA) 2. Dysfunctions associated with sleep stages or arousal from sleep  RECOMMENDATIONS:  1. This study demonstrates severe obstructive sleep apnea, with a total AHI of 34.3/hour, REM AHI of 51/hour, supine AHI of 38.1/hour and O2 nadir of 70% (during nonsupine REM sleep). Treatment with a positive airway pressure (PAP) device is recommended.  The patient will be advised to proceed with home autoPAP therapy for now.  A laboratory attended, full night PAP-titration study can be considered down the road, to optimize therapy settings, mask fit, monitoring of tolerance and of proper oxygen saturations. Other treatment options may be limited, and may include (generally speaking) surgical  options in selected patients. Concomitant weight loss is highly recommended (where clinically feasible). Please note that untreated obstructive sleep apnea may carry additional perioperative morbidity. Patients with significant obstructive sleep apnea should receive perioperative PAP therapy and the surgeons and particularly the anesthesiologist should be informed of the diagnosis and the severity of the sleep disordered breathing. 2. This study shows sleep fragmentation and abnormal sleep stage percentages; these are nonspecific findings and per se do not signify an intrinsic sleep disorder or a cause for the patient's sleep-related symptoms. Causes include (but are not limited to) the first night effect of the sleep study, circadian rhythm disturbances, medication effect or an underlying mood disorder or medical problem.  3. The patient should be cautioned not to drive, work at heights, or operate dangerous or heavy equipment when tired or sleepy. Review and reiteration of good sleep hygiene measures should be pursued with any patient. 4. The patient will be seen in follow-up in the sleep clinic at [facility] for discussion of the test results, symptom and treatment compliance review, further management strategies, etc. The patient and the referring provider will be notified of the test results.   I certify that I have  reviewed the entire raw data recording prior to the issuance of this report in accordance with the Standards of Accreditation of the American Academy of Sleep Medicine (AASM).  Huston Foley, MD, PhD Medical Director, [facility]) Diplomat, ABPN (Neurology and Sleep)               Technical Report:   General Information  Name: Herman, Mell BMI: 46.09 Physician: Huston Foley, MD  ID: [id number] Height: 62.0 in Technician: Margaretann Loveless, RPSGT  Sex: Female Weight: 252.0 lb Record: [id number]  Age: 20 [10-Sep-1975] Date: 05/31/2023     Medical & Medication History    47 year old female with an underlying medical history of reflux disease, allergies, melanoma, diverticulitis, chronic back pain, history of gastroparesis, hepatomegaly, scoliosis, history of seizures (per chart review, in the context of postpartum eclampsia), depression, and severe obesity with a BMI of over 45, who reports snoring and excessive daytime somnolence, as well as difficulty sleeping at night. She had a sleep study some 10 years ago but did not have sleep apnea at the time. Tylenol, Zyrtec, Advil, Protonix, Zoloft, CBD Gummy   Sleep Disorder      Comments   The patient came into the lab for a PSG. The patient took Ambien, Zoloft, Delta-8, Tylenol and Rest-EZ prior to start of study. No restroom breaks. No obvious cardiac arrhythmias. All sleep stages witnessed. Respiratory events scored with a 3% desat. Slept lateral and supine. Moderate to loud snoring. AHI was 36.5 after 2 hrs TST. The patient had somniloquy and moaning during the night. Respiratory events worse while in REM and in the supine position. The patient fell asleep quickly after being asked to put her phone away.     Lights out: 10:14:17 PM Lights on: 05:00:24 AM   Time Total Supine Side Prone Upright  Recording (TRT) 6h 46.66m 2h 56.28m 3h 49.51m 0h 0.92m 0h 0.41m  Sleep (TST) 4h 50.56m 2h 34.91m 2h 16.34m 0h 0.40m 0h 0.24m   Latency N1 N2 N3 REM Onset Per. Slp. Eff.  Actual 0h 0.52m 0h 2.81m 0h 40.50m 2h 48.62m 1h 51.69m 1h 51.22m 71.55%   Stg Dur Wake N1 N2 N3 REM  Total 115.5 2.5 154.5 77.0 56.5  Supine 22.0 0.5 83.5 70.5 0.0  Side 93.5 2.0 71.0 6.5 56.5  Prone 0.0 0.0 0.0 0.0 0.0  Upright 0.0 0.0 0.0 0.0 0.0   Stg % Wake N1 N2 N3 REM  Total 28.4 0.9 53.2 26.5 19.4  Supine 5.4 0.2 28.7 24.3 0.0  Side 23.0 0.7 24.4 2.2 19.4  Prone 0.0 0.0 0.0 0.0 0.0  Upright 0.0 0.0 0.0 0.0 0.0     Apnea Summary Sub Supine Side Prone Upright  Total 4 Total 4 1 3  0 0    REM 1 0 1 0 0    NREM 3 1 2  0 0   Obs 3 REM 0 0 0 0 0    NREM 3 1 2  0 0  Mix 0 REM 0 0 0 0 0    NREM 0 0 0 0 0  Cen 1 REM 1 0 1 0 0    NREM 0 0 0 0 0   Rera Summary Sub Supine Side Prone Upright  Total 0 Total 0 0 0 0 0    REM 0 0 0 0 0    NREM 0 0 0 0 0   Hypopnea Summary Sub Supine Side Prone Upright  Total 162 Total 162 97 65  0 0    REM 47 0 47 0 0    NREM 115 97 18 0 0   4% Hypopnea Summary Sub Supine Side Prone Upright  Total (4%) 120 Total 120 65 55 0 0    REM 43 0 43 0 0    NREM 77 65 12 0 0     AHI Total Obs Mix Cen  34.29 Apnea 0.83 0.62 0.00 0.21   Hypopnea 33.46 -- -- --  25.61 Hypopnea (4%) 24.78 -- -- --    Total Supine Side Prone Upright  Position AHI 34.29 38.06 30.00 0.00 0.00  REM AHI 50.97   NREM AHI 30.26   Position RDI 34.29 38.06 30.00 0.00 0.00  REM RDI 50.97   NREM RDI 30.26    4% Hypopnea Total Supine Side Prone Upright  Position AHI (4%) 25.61 25.63 25.59 0.00 0.00  REM AHI (4%) 46.73   NREM AHI (4%) 20.51   Position RDI (4%) 25.61 25.63 25.59 0.00 0.00  REM RDI (4%) 46.73   NREM RDI (4%) 20.51    Desaturation Information Threshold: 2% <100% <90% <80% <70% <60% <50% <40%  Supine 147.0 102.0 0.0 0.0 0.0 0.0 0.0  Side 150.0 91.0 7.0 0.0 0.0 0.0 0.0  Prone 0.0 0.0 0.0 0.0 0.0 0.0 0.0  Upright 0.0 0.0 0.0 0.0 0.0 0.0 0.0  Total 297.0 193.0 7.0 0.0 0.0 0.0 0.0  Index 60.5 39.3 1.4 0.0 0.0 0.0 0.0   Threshold: 3% <100% <90% <80% <70% <60% <50% <40%  Supine 113.0 88.0 0.0 0.0 0.0 0.0 0.0  Side 98.0 69.0 7.0 0.0 0.0 0.0 0.0  Prone 0.0 0.0 0.0 0.0 0.0 0.0 0.0  Upright 0.0 0.0 0.0 0.0 0.0 0.0 0.0  Total 211.0 157.0 7.0 0.0 0.0 0.0 0.0  Index 43.0 32.0 1.4 0.0 0.0 0.0 0.0   Threshold: 4% <100% <90% <80% <70% <60% <50% <40%  Supine 71.0 56.0 0.0 0.0 0.0 0.0 0.0  Side 74.0 58.0 6.0 0.0 0.0 0.0 0.0  Prone 0.0 0.0 0.0 0.0 0.0 0.0 0.0  Upright 0.0 0.0 0.0 0.0 0.0 0.0 0.0  Total 145.0 114.0 6.0 0.0 0.0 0.0 0.0  Index 29.5 23.2 1.2 0.0 0.0 0.0 0.0   Threshold: 3% <100% <90%  <80% <70% <60% <50% <40%  Supine 113 88 0 0 0 0 0  Side 98 69 7 0 0 0 0  Prone 0 0 0 0 0 0 0  Upright 0 0 0 0 0 0 0  Total 211 157 7 0 0 0 0   Awakening/Arousal Information # of Awakenings 8  Wake after sleep onset 4.4m  Wake after persistent sleep 4.24m   Arousal Assoc. Arousals Index  Apneas 2 0.4  Hypopneas 37 7.6  Leg Movements 0 0.0  Snore 0 0.0  PTT Arousals 0 0.0  Spontaneous 36 7.4  Total 75 15.5  Leg Movement Information PLMS LMs Index  Total LMs during PLMS 0 0.0  LMs w/ Microarousals 0 0.0   LM LMs Index  w/ Microarousal 0 0.0  w/ Awakening 0 0.0  w/ Resp Event 0 0.0  Spontaneous 1 0.2  Total 1 0.2     Desaturation threshold setting: 3% Minimum desaturation setting: 10 seconds SaO2 nadir: 70% The longest event was a 73 sec obstructive Hypopnea with a minimum SaO2 of 81%. The lowest SaO2 was 70% associated with a 16 sec obstructive Hypopnea. EKG Rates EKG Avg Max Min  Awake 77 92 64  Asleep 71  95 56  EKG Events: Tachycardia

## 2023-06-06 ENCOUNTER — Telehealth: Payer: Self-pay | Admitting: *Deleted

## 2023-06-06 NOTE — Telephone Encounter (Signed)
Relayed results pf sleep study to pt.  Severe OSA.  Start autopap, may do cpap titration later if needed. Use 4 hours every night, will see back for appt in 31-89 days.  She will call to set up once gets machine.  She verbalized understanding.

## 2023-06-06 NOTE — Addendum Note (Signed)
Addended by: Huston Foley on: 06/06/2023 04:42 PM   Modules accepted: Orders

## 2023-06-06 NOTE — Telephone Encounter (Signed)
-----   Message from Huston Foley sent at 06/06/2023  4:42 PM EST ----- Urgent set up requested on PAP therapy, due to severe OSA. Patient referred by PCP, seen by me on 04/06/23, diagnostic PSG on 05/31/23.    Please call and notify the patient that the recent sleep study showed severe obstructive sleep apnea. I recommend treatment for in the form of autoPAP. We may consider at a CPAP titration study at a later date, if need be, which means, that we would ask her to come back in for a second sleep study with CPAP treatment. For now, I would like to start her on a so-called autoPAP machine at home, through a DME company (of her choice, or as per insurance requirement). The DME representative will educate her on how to use the machine, how to put the mask on, etc. I have placed an order in the chart. Please send referral, talk to patient, send report to referring MD. We will need a FU in sleep clinic for 10 weeks post-PAP set up, please arrange that with me or one of our NPs. Thanks,   Huston Foley, MD, PhD [facility])

## 2023-06-08 NOTE — Telephone Encounter (Signed)
Zott, Hennie Duos, RN; Melvern Sample Got It Thank you     Previous Messages    ----- Message ----- From: Guy Begin, RN Sent: 06/06/2023   5:16 PM EST To: Marlou Porch Zott Subject: new autopap user severe osa                    Meghan Wilcox Female, 47 y.o., 06/12/1976 MRN: [id number] Phone: [phone]   New order in epic  Thank you s

## 2023-06-15 DIAGNOSIS — G4733 Obstructive sleep apnea (adult) (pediatric): Secondary | ICD-10-CM | POA: Diagnosis not present

## 2023-06-24 ENCOUNTER — Other Ambulatory Visit: Payer: Self-pay | Admitting: Adult Health

## 2023-06-24 DIAGNOSIS — G4700 Insomnia, unspecified: Secondary | ICD-10-CM

## 2023-07-15 DIAGNOSIS — G4733 Obstructive sleep apnea (adult) (pediatric): Secondary | ICD-10-CM | POA: Diagnosis not present

## 2023-08-04 ENCOUNTER — Other Ambulatory Visit: Payer: Self-pay | Admitting: Adult Health

## 2023-08-04 DIAGNOSIS — G4700 Insomnia, unspecified: Secondary | ICD-10-CM

## 2023-08-15 DIAGNOSIS — G4733 Obstructive sleep apnea (adult) (pediatric): Secondary | ICD-10-CM | POA: Diagnosis not present

## 2023-08-25 ENCOUNTER — Encounter: Payer: Self-pay | Admitting: Neurology

## 2023-08-25 ENCOUNTER — Ambulatory Visit: Payer: BC Managed Care – PPO | Admitting: Neurology

## 2023-08-25 VITALS — BP 107/65 | HR 74 | Ht 62.0 in | Wt 253.0 lb

## 2023-08-25 DIAGNOSIS — G4734 Idiopathic sleep related nonobstructive alveolar hypoventilation: Secondary | ICD-10-CM | POA: Diagnosis not present

## 2023-08-25 DIAGNOSIS — Z6841 Body Mass Index (BMI) 40.0 and over, adult: Secondary | ICD-10-CM | POA: Diagnosis not present

## 2023-08-25 DIAGNOSIS — G4733 Obstructive sleep apnea (adult) (pediatric): Secondary | ICD-10-CM

## 2023-08-25 NOTE — Progress Notes (Signed)
Subjective:    Meghan Wilcox ID: Meghan Wilcox is a 48 y.o. female.  HPI    Interim history:   Meghan Wilcox is a 48 year old female with an underlying medical history of reflux disease, allergies, melanoma, diverticulitis, chronic back pain, history of gastroparesis, hepatomegaly, scoliosis, history of seizures (per chart review, in the context of postpartum eclampsia), depression, and severe obesity with a BMI of over 45, who presents for follow-up consultation of Meghan Wilcox obstructive sleep apnea after interim testing and starting home AutoPap therapy.  The Meghan Wilcox is unaccompanied today.  I first met Meghan Wilcox at the request of Meghan Wilcox primary care nurse practitioner on 05/06/2023, at which time Meghan Wilcox reported snoring and excessive daytime sleepiness as well as difficulty sleeping at night.  A prior sleep study several years ago had been negative for sleep apnea.  Meghan Wilcox was advised to proceed with a sleep study.  Meghan Wilcox had a baseline sleep study through our sleep lab on 05/31/2023 which showed severe obstructive sleep apnea with an AHI of 34.3/h, REM AHI of 51/h, supine AHI of 38.1/h and O2 nadir of 70% with significant time below or at 88% saturation of nearly 1 hour for the night.  Meghan Wilcox was advised to proceed with home AutoPap therapy.  Meghan Wilcox set up date was 06/15/2023.  Meghan Wilcox has a ResMed air sense 10 AutoSet machine.  Meghan Wilcox DME provider is [facility].   Today, 08/25/2023: I reviewed Meghan Wilcox AutoPap compliance data from 07/25/2023 through 08/23/2023, which is a total of 30 days, during which time Meghan Wilcox used Meghan Wilcox machine every night with percent use days greater than 4 hours at 97%, indicating excellent compliance with an average usage of 8 hours and 12 minutes, residual AHI at goal at 2.5/h, 95th percentile of pressure at 12.9 cm with a range of 7 to 13 cm with EPR of 3.  Leak acceptable with the 95th percentile at 7.5 L/min.  Meghan Wilcox reports doing quite well.  While Meghan Wilcox is still adjusting to treatment, Meghan Wilcox is overall doing well.  Meghan Wilcox feels that  Meghan Wilcox has benefited from treatment and that Meghan Wilcox seems to be less restless at night.  Meghan Wilcox typically sleeps on Meghan Wilcox right side.  Meghan Wilcox chose to use a hybrid style fullface mask from [facility] which Meghan Wilcox tolerates quite well.  Meghan Wilcox tried to adjust the temperature on the humidifier but Meghan Wilcox noticed condensation in the tubing and mask and turned it back to auto.  Meghan Wilcox is very motivated to continue with treatment.  Meghan Wilcox still takes Ambien as needed, typically on the weekends and occasional delta 8.  Meghan Wilcox is working on weight loss but unfortunately Meghan Wilcox insurance did not approve Ozempic as I understand.  Meghan Wilcox is encouraged to talk to Meghan Wilcox PCP about the possibility of getting a prescription for Zepbound as it recently obtained FDA approval for patients with OSA as well.   The Meghan Wilcox's allergies, current medications, family history, past medical history, past social history, past surgical history and problem list were reviewed and updated as appropriate.   Previously:   04/06/2023: (Meghan Wilcox) reports snoring and excessive daytime somnolence, as well as difficulty sleeping at night.  Meghan Wilcox Epworth sleepiness score is 7 out of 24, fatigue severity score is 46 out of 63.  Meghan Wilcox had a sleep study some 10 years ago but did not have sleep apnea at the time.  Meghan Wilcox has had interim weight gain over the course of years.  Meghan Wilcox is working on weight loss.  Meghan Wilcox lives with Meghan Wilcox husband and youngest daughter age 4.  Meghan Wilcox herself has 3 biological children and 2 stepchildren.  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  Meghan Wilcox was on Ambien for many years, took a break for several years and recently restarted at 5 mg strength.  Meghan Wilcox reports that the 5 mg Ambien does not help Meghan Wilcox, Meghan Wilcox has a longstanding history of difficulty falling asleep.  Meghan Wilcox has vivid dreams.  Meghan Wilcox has a history of sleep talking since childhood.  Meghan Wilcox admits to taking Ambien 10 mg at night along with CBD gummy.  Bedtime is generally  between 830 and 9 PM and rise time is around 7:45 AM.  Meghan Wilcox does have a TV in the bedroom and they generally turn it off between 10 PM and 10:30 PM.  Meghan Wilcox has 2 dogs in the household, the dogs do not sleep in Meghan Wilcox bedroom.  Meghan Wilcox has had occasional morning headaches, wonders if it is from jaw clenching or teeth grinding.  Meghan Wilcox quit smoking cigarettes about 4 years ago, drinks alcohol very occasionally, vapes nicotine daily.  Meghan Wilcox thinks caffeine occasionally.  Both parents had sleep apnea.  Meghan Wilcox does not have nightly nocturia. I reviewed your office note from 02/28/2023.     Meghan Wilcox Past Medical History Is Significant For: Past Medical History:  Diagnosis Date   Allergy    seasonal   Blood transfusion age 74yrs   Antibody scewwn neg. 08/20/10 prior to cholecystectomy   Cancer (HCC) 2018   2 spots melanoma removed under left breast   Cervical mass    Chronic back pain    Diverticulitis    Family history of malignant neoplasm of gastrointestinal tract    Fatty liver    Gastroparesis    GERD (gastroesophageal reflux disease)    Hepatomegaly    History of ovarian cyst    Scoliosis    Seizures (HCC)    post partum  eclampsia  22 yrs ago    Meghan Wilcox Past Surgical History Is Significant For: Past Surgical History:  Procedure Laterality Date   BLADDER SUSPENSION  10/01/2011   Procedure: TRANSVAGINAL TAPE (TVT) PROCEDURE;  Surgeon: Zenaida Niece, MD;  Location: [facility];  Service: Gynecology;  Laterality: N/A;  Solyx Sling   CERVICAL POLYPECTOMY N/A 11/21/2020   Procedure: CERVICAL POLYPECTOMY/REMOVAL OF MASS;  Surgeon: Lavina Hamman, MD;  Location: [facility];  Service: Gynecology;  Laterality: N/A;   CHOLECYSTECTOMY  08/2010   Dr Bertram Savin   CHOLESTEATOMA EXCISION  1985   CYSTOSCOPY  10/01/2011   Procedure: CYSTOSCOPY;  Surgeon: Zenaida Niece, MD;  Location: [facility];  Service: Gynecology;  Laterality: N/A;   INNER EAR SURGERY Left age 18   KNEE SURGERY Left age 21   arthroscopic    LAPAROSCOPY  10/01/2011   Procedure: LAPAROSCOPY OPERATIVE;  Surgeon: Zenaida Niece, MD;  Location: [facility];  Service: Gynecology;  Laterality: N/A;  removal of right fimbria, fulgeration of endometriosis   PERINEAL LACERATION REPAIR N/A 12/12/2020   Procedure: CERVICAL LACERATION REPAIR;  Surgeon: Carlisle Cater, MD;  Location: [facility];  Service: Gynecology;  Laterality: N/A;   Steel rod in Meghan Wilcox back  age 41   scoliosis   TONSILLECTOMY  as child   and adenoids   TUBAL LIGATION  april 31 2007   tubes in ears     childhood   uterine ablation  2008   WISDOM TOOTH EXTRACTION  2000    Meghan Wilcox Family History Is Significant  For: Family History  Problem Relation Age of Onset   Diabetes Mother    Cirrhosis Mother    Irritable bowel syndrome Mother    Crohn's disease Mother    Lung cancer Mother    Heart disease Mother    Sleep apnea Mother    Sleep apnea Father    Diabetes Father    Heart disease Father    Liver disease Father    Cirrhosis Father    Diabetes Brother    Colon cancer Paternal Grandfather 36   Colon cancer Other    Rectal cancer Other    Esophageal cancer Neg Hx    Stomach cancer Neg Hx     Meghan Wilcox Social History Is Significant For: Social History   Socioeconomic History   Marital status: Married    Spouse name: Not on file   Number of children: 3   Years of education: Not on file   Highest education level: Not on file  Occupational History   Occupation: ADMIN    Employer: [employer]  Tobacco Use   Smoking status: Former    Current packs/day: 0.00    Average packs/day: 1 pack/day for 30.0 years (30.0 ttl pk-yrs)    Types: Cigarettes    Start date: 01/30/1990    Quit date: 01/31/2020    Years since quitting: 3.5   Smokeless tobacco: Never  Vaping Use   Vaping status: Every Day   Substances: Nicotine, Flavoring  Substance and Sexual Activity   Alcohol use: Yes    Comment: special occasions   Drug use: No   Sexual activity: Yes    Birth  control/protection: Surgical  Other Topics Concern   Not on file  Social History Narrative   Caffeine: rarely   Lives at home with husband and child   Right handed   Social Drivers of Corporate investment banker Strain: Not on file  Food Insecurity: Not on file  Transportation Needs: Not on file  Physical Activity: Not on file  Stress: Not on file  Social Connections: Unknown (11/30/2021)   Received from [facility]   Social Network    Social Network: Not on file    Meghan Wilcox Allergies Are:  No Known Allergies:   Meghan Wilcox Current Medications Are:  Outpatient Encounter Medications as of 08/25/2023  Medication Sig   acetaminophen (TYLENOL) 500 MG tablet Take 1,000 mg by mouth at bedtime as needed.   cetirizine (ZYRTEC) 10 MG tablet Take 10 mg by mouth at bedtime.   NON FORMULARY Take 1 Piece by mouth as needed. 1 CBD gummy for sleep as needed.   pantoprazole (PROTONIX) 40 MG tablet Take 1 tablet (40 mg total) by mouth daily.   sertraline (ZOLOFT) 50 MG tablet TAKE 1 TABLET BY MOUTH DAILY   zolpidem (AMBIEN) 5 MG tablet TAKE 2 TABLETS BY MOUTH AT BEDTIME AS NEEDED FOR SLEEP   Semaglutide-Weight Management 0.25 MG/0.5ML SOAJ Inject 0.25 mg into the skin once a week. (Meghan Wilcox not taking: Reported on 08/25/2023)   No facility-administered encounter medications on file as of 08/25/2023.  :  Review of Systems:  Out of a complete 14 point review of systems, all are reviewed and negative with the exception of these symptoms as listed below:   Review of Systems  Neurological:        Meghan Wilcox is here alone for initial cpap follow-up. Meghan Wilcox states overall things are going pretty well. There are some nights Meghan Wilcox struggles with it but overall ok. ESS  8 FSS 45    Objective:  Neurological Exam  Physical Exam Physical Examination:   Vitals:   08/25/23 0746  BP: 107/65  Pulse: 74    General Examination: The Meghan Wilcox is a very pleasant 48 y.o. female in no acute distress. Meghan Wilcox  appears well-developed and well-nourished and well groomed.   HEENT: Normocephalic, atraumatic, pupils are equal, round and reactive to light, extraocular tracking is good without limitation to gaze excursion or nystagmus noted. Hearing is grossly intact. Face is symmetric with normal facial animation. Speech is clear with no dysarthria noted. There is no hypophonia. There is no lip, neck/head, jaw or voice tremor. Neck is supple with full range of passive and active motion. There are no carotid bruits on auscultation. Oropharynx exam reveals: mild mouth dryness, good dental hygiene and mild airway crowding. Tongue protrudes centrally and palate elevates symmetrically.   Chest: Clear to auscultation without wheezing, rhonchi or crackles noted.   Heart: S1+S2+0, regular and normal without murmurs, rubs or gallops noted.    Abdomen: Soft, non-tender and non-distended.   Extremities: There is no obvious swelling in the distal lower extremities bilaterally.    Skin: Warm and dry without trophic changes noted.    Musculoskeletal: exam reveals no obvious joint deformities.    Neurologically:  Mental status: The Meghan Wilcox is awake, alert and oriented in all 4 spheres. Meghan Wilcox immediate and remote memory, attention, language skills and fund of knowledge are appropriate. There is no evidence of aphasia, agnosia, apraxia or anomia. Speech is clear with normal prosody and enunciation. Thought process is linear. Mood is normal and affect is normal.  Cranial nerves II - XII are as described above under HEENT exam.  Motor exam: Normal bulk, strength and tone is noted. There is no obvious action or resting tremor.  Fine motor skills and coordination: grossly intact.  Cerebellar testing: No dysmetria or intention tremor. There is no truncal or gait ataxia.  Sensory exam: intact to light touch in the upper and lower extremities.  Gait, station and balance: Meghan Wilcox stands easily. No veering to one side is noted. No  leaning to one side is noted. Posture is age-appropriate and stance is narrow based. Gait shows normal stride length and normal pace. No problems turning are noted.    Assessment and Plan:  In summary, JILLENE KRIESE is a very pleasant 48 year old female with an underlying medical history of reflux disease, allergies, melanoma, diverticulitis, chronic back pain, history of gastroparesis, hepatomegaly, scoliosis, history of seizures (per chart review, in the context of postpartum eclampsia), depression, and severe obesity with a BMI of over 45, who presents for follow-up consultation of Meghan Wilcox obstructive sleep apnea after interim testing and starting home AutoPap therapy. Meghan Wilcox had a baseline sleep study through our sleep lab on 05/31/2023 which showed severe obstructive sleep apnea with an AHI of 34.3/h, REM AHI of 51/h, supine AHI of 38.1/h and O2 nadir of 70% with significant time below or at 88% saturation of nearly 1 hour for the night.  Meghan Wilcox has been on AutoPap therapy since 06/15/2023.  Meghan Wilcox has a ResMed air sense 10 AutoSet machine.  Meghan Wilcox DME provider is [facility].  Meghan Wilcox uses a hybrid style fullface mask from [employer] with good tolerance.  Apnea scores look great, compliance is excellent, Meghan Wilcox is adjusting to treatment well and has also noticed some benefit already.  We talked about sleep apnea in general and the importance of treating moderate to severe obstructive sleep apnea and what benefit Meghan Wilcox may  reap over time.  Meghan Wilcox is highly commended for treatment adherence and encouraged to continue with the current settings.  We talked in detail about Meghan Wilcox sleep study results and reviewed Meghan Wilcox compliance data in detail as well.  Since Meghan Wilcox had significant desaturations during nonsupine REM sleep I would like to go ahead with a overnight pulse oximetry test 1 of these days while Meghan Wilcox is using Meghan Wilcox AutoPap machine is used well.  Meghan Wilcox is reminded not to use the finger with nail polish for the pulse ox.  Meghan Wilcox is encouraged to  continue with Meghan Wilcox weight loss journey but talk to Meghan Wilcox PCP about a different injectable rather than Wegovy or Ozempic since Zepbound has recently been given FDA approval for patients with OSA.  If all goes well and Meghan Wilcox oxygen saturations look good overnight, Meghan Wilcox can follow-up routinely in this clinic in 1 year.  Meghan Wilcox is encouraged to see one of our nurse practitioners routinely, we can also offer Meghan Wilcox a video visit if Meghan Wilcox likes.  I answered all Meghan Wilcox questions today and Meghan Wilcox was in agreement.  I spent 40 minutes in total face-to-face time and in reviewing records during pre-charting, more than 50% of which was spent in counseling and coordination of care, reviewing test results, reviewing medications and treatment regimen and/or in discussing or reviewing the diagnosis of OSA, oxygen desaturations during sleep, the prognosis and treatment options. Pertinent laboratory and imaging test results that were available during this visit with the Meghan Wilcox were reviewed by me and considered in my medical decision making (see chart for details).

## 2023-08-25 NOTE — Patient Instructions (Addendum)
It was nice to see you again today. I am glad to hear, things are going well with your autoPAP therapy. You have adjusted well to treatment with your new machine, and you are compliant with it. You have also fulfilled the insurance-mandated compliance percentage, which is reassuring, so you can get ongoing supplies through your insurance. Please talk to your DME provider about getting replacement supplies on a regular basis. Please be sure to change your filter every month, your mask about every 3 months, hose about every 6 months, humidifier chamber about yearly. Some restrictions are imposed by your insurance carrier with regard to how frequently you can get certain supplies.  Your DME company can provide further details if necessary.    Continue your weight loss journey. Talk to your PCP about Zepbound, since it has been approved for the indication of sleep apnea. You should be able to get a prior authorization approved now.    Please continue using your autoPAP regularly. While your insurance requires that you use PAP at least 4 hours each night on 70% of the nights, I recommend, that you not skip any nights and use it throughout the night if you can. Getting used to PAP and staying with the treatment long term does take time and patience and discipline. Untreated obstructive sleep apnea when it is moderate to severe can have an adverse impact on cardiovascular health and raise her risk for heart disease, arrhythmias, hypertension, congestive heart failure, stroke and diabetes. Untreated obstructive sleep apnea causes sleep disruption, nonrestorative sleep, and sleep deprivation. This can have an impact on your day to day functioning and cause daytime sleepiness and impairment of cognitive function, memory loss, mood disturbance, and problems focussing. Using PAP regularly can improve these symptoms.  As discussed, we will do an overnight oxygen level test, called ONO, and your DME company will call and  set this up for one night, while you also use your autoPAP as usual. We will call you with the results. This is to make sure that your oxygen levels stay in the 90s, while you are treated with autoPAP for your OSA. Remember, your oxygen levels dropped to 70% during your sleep study  So long as your oxygen study looks fine while on AutoPap therapy, we can see you in 1 year, you can see one of our nurse practitioners as you are stable.

## 2023-08-26 NOTE — Progress Notes (Signed)
Zott, Hennie Duos, RN; Melvern Sample Got it     Previous Messages    ----- Message ----- From: Guy Begin, RN Sent: 08/25/2023   3:30 PM EST To: Marlou Porch Zott Subject: ono                                            New order in EPIC  ONO x 1 night while pt on autopap fax results to [phone]  Dr. Frances Furbish pod 4.  Meghan Wilcox Female, 48 y.o., 01/13/1976 MRN: [id number]   Thanks

## 2023-09-15 DIAGNOSIS — G4733 Obstructive sleep apnea (adult) (pediatric): Secondary | ICD-10-CM | POA: Diagnosis not present

## 2023-09-26 DIAGNOSIS — G4733 Obstructive sleep apnea (adult) (pediatric): Secondary | ICD-10-CM | POA: Diagnosis not present

## 2023-10-13 ENCOUNTER — Telehealth: Payer: Self-pay

## 2023-10-13 ENCOUNTER — Encounter: Payer: Self-pay | Admitting: Adult Health

## 2023-10-13 ENCOUNTER — Ambulatory Visit: Payer: Managed Care, Other (non HMO) | Admitting: Adult Health

## 2023-10-13 DIAGNOSIS — G4733 Obstructive sleep apnea (adult) (pediatric): Secondary | ICD-10-CM | POA: Diagnosis not present

## 2023-10-13 DIAGNOSIS — J302 Other seasonal allergic rhinitis: Secondary | ICD-10-CM

## 2023-10-13 DIAGNOSIS — Z720 Tobacco use: Secondary | ICD-10-CM | POA: Diagnosis not present

## 2023-10-13 DIAGNOSIS — K219 Gastro-esophageal reflux disease without esophagitis: Secondary | ICD-10-CM | POA: Diagnosis not present

## 2023-10-13 DIAGNOSIS — F5101 Primary insomnia: Secondary | ICD-10-CM

## 2023-10-13 DIAGNOSIS — F321 Major depressive disorder, single episode, moderate: Secondary | ICD-10-CM

## 2023-10-13 MED ORDER — ZOLPIDEM TARTRATE 10 MG PO TABS: 10.0000 mg | ORAL_TABLET | Freq: Every evening | ORAL | 0 refills | Status: DC | PRN

## 2023-10-13 MED ORDER — TIRZEPATIDE-WEIGHT MANAGEMENT 2.5 MG/0.5ML ~~LOC~~ SOLN: 2.5000 mg | SUBCUTANEOUS | 3 refills | Status: DC

## 2023-10-13 NOTE — Telephone Encounter (Signed)
 PA for medication Zepbound came through fax.   Temica Righetti (Key: [id number]) - [id number] Zepbound 2.5MG /0.5ML solution status: PA RequestCreated: March 13th, 2025 [phone]: March 13th, 2025

## 2023-10-13 NOTE — Telephone Encounter (Signed)
 PA form came over to [place] from [employer] for Zepbound. Paper placed in PCP Medina-Vargas, Monina C, NP review and sign folder to be completed.

## 2023-10-13 NOTE — Progress Notes (Signed)
 [facility] clinic  Provider:  Kenard Gower DNP  Code Status: Full Code  Goals of Care:     10/13/2023    8:46 AM  Advanced Directives  Does Patient Have a Medical Advance Directive? No  Would patient like information on creating a medical advance directive? No - Patient declined     Chief Complaint  Patient presents with   Follow-up    6 month follow up.   Discussed the use of AI scribe software for clinical note transcription with the patient, who gave verbal consent to proceed.  HPI: Patient is a 48 y.o. female seen today for a 56-month follow up of chronic medical issues.  She uses a CPAP machine with a full-face mask for her severe sleep apnea. She feels more rested, although she is still adjusting to the equipment. She describes the sensation of air blowing on her face as 'a good feeling' and notes that it is not suffocating.  Her weight has decreased from 253 pounds in September to 245 pounds currently. She has not started semaglutide due to insurance issues but is considering Zepbound for weight management, which is approved for patients with sleep apnea, per neurology.  She vapes nicotine daily.  She was hospitalized in October for diverticulitis at [facility] and is currently doing well with no ongoing issues related to this condition.  She takes Protonix for acid reflux, which is well-controlled.  She takes Zoloft for depression, which she finds effective, and notices a difference if she misses a dose.  She uses Ambien as needed for sleep, typically on weekends, and reports getting 6 to 9 hours of sleep per night.  She uses Zyrtec for seasonal allergies, which are starting to flare up.  Her daughter is graduating high school this year and plans to pursue a respiratory therapy certificate. She has a grandchild due in July and a stepson who is engaged.    Past Medical History:  Diagnosis Date   Allergy    seasonal   Blood transfusion age 20yrs   Antibody  scewwn neg. 08/20/10 prior to cholecystectomy   Cancer (HCC) 2018   2 spots melanoma removed under left breast   Cervical mass    Chronic back pain    Diverticulitis    Family history of malignant neoplasm of gastrointestinal tract    Fatty liver    Gastroparesis    GERD (gastroesophageal reflux disease)    Hepatomegaly    History of ovarian cyst    Scoliosis    Seizures (HCC)    post partum  eclampsia  22 yrs ago    Past Surgical History:  Procedure Laterality Date   BLADDER SUSPENSION  10/01/2011   Procedure: TRANSVAGINAL TAPE (TVT) PROCEDURE;  Surgeon: Zenaida Niece, MD;  Location: [facility];  Service: Gynecology;  Laterality: N/A;  Solyx Sling   CERVICAL POLYPECTOMY N/A 11/21/2020   Procedure: CERVICAL POLYPECTOMY/REMOVAL OF MASS;  Surgeon: Lavina Hamman, MD;  Location: [facility] ;  Service: Gynecology;  Laterality: N/A;   CHOLECYSTECTOMY  08/2010   Dr Bertram Savin   CHOLESTEATOMA EXCISION  1985   CYSTOSCOPY  10/01/2011   Procedure: CYSTOSCOPY;  Surgeon: Zenaida Niece, MD;  Location: [facility];  Service: Gynecology;  Laterality: N/A;   INNER EAR SURGERY Left age 52   KNEE SURGERY Left age 49   arthroscopic   LAPAROSCOPY  10/01/2011   Procedure: LAPAROSCOPY OPERATIVE;  Surgeon: Zenaida Niece, MD;  Location: [facility];  Service: Gynecology;  Laterality: N/A;  removal of right fimbria, fulgeration of endometriosis   PERINEAL LACERATION REPAIR N/A 12/12/2020   Procedure: CERVICAL LACERATION REPAIR;  Surgeon: Carlisle Cater, MD;  Location: [facility];  Service: Gynecology;  Laterality: N/A;   Steel rod in her back  age 43   scoliosis   TONSILLECTOMY  as child   and adenoids   TUBAL LIGATION  april 31 2007   tubes in ears     childhood   uterine ablation  2008   WISDOM TOOTH EXTRACTION  2000    No Known Allergies  Outpatient Encounter Medications as of 10/13/2023  Medication Sig   acetaminophen (TYLENOL) 500 MG tablet Take 1,000 mg by mouth at bedtime as  needed.   cetirizine (ZYRTEC) 10 MG tablet Take 10 mg by mouth at bedtime.   NON FORMULARY Take 1 Piece by mouth as needed. 1 CBD gummy for sleep as needed.   pantoprazole (PROTONIX) 40 MG tablet Take 1 tablet (40 mg total) by mouth daily.   Semaglutide-Weight Management 0.25 MG/0.5ML SOAJ Inject 0.25 mg into the skin once a week. (Patient not taking: Reported on 08/25/2023)   sertraline (ZOLOFT) 50 MG tablet TAKE 1 TABLET BY MOUTH DAILY   zolpidem (AMBIEN) 5 MG tablet TAKE 2 TABLETS BY MOUTH AT BEDTIME AS NEEDED FOR SLEEP   No facility-administered encounter medications on file as of 10/13/2023.    Review of Systems:  Review of Systems  Constitutional:  Negative for appetite change, chills, fatigue and fever.  HENT:  Negative for congestion, hearing loss, rhinorrhea and sore throat.   Eyes: Negative.   Respiratory:  Negative for cough, shortness of breath and wheezing.   Cardiovascular:  Negative for chest pain, palpitations and leg swelling.  Gastrointestinal:  Negative for abdominal pain, constipation, diarrhea, nausea and vomiting.  Genitourinary:  Negative for dysuria.  Musculoskeletal:  Negative for arthralgias, back pain and myalgias.  Skin:  Negative for color change, rash and wound.  Neurological:  Negative for dizziness, weakness and headaches.  Psychiatric/Behavioral:  Negative for behavioral problems. The patient is not nervous/anxious.     Health Maintenance  Topic Date Due   Cervical Cancer Screening (HPV/Pap Cotest)  Never done   Pneumococcal Vaccine 50-13 Years old (2 of 2 - PCV) 02/27/2016   DTaP/Tdap/Td (2 - Td or Tdap) 09/10/2019   MAMMOGRAM  12/14/2019   INFLUENZA VACCINE  03/03/2023   COVID-19 Vaccine (1 - 2024-25 season) Never done   Colonoscopy  09/12/2023   Hepatitis C Screening  Completed   HIV Screening  Completed   HPV VACCINES  Aged Out    Physical Exam: Vitals:   10/12/23 1649  Height: 5\' 2"  (1.575 m)   Body mass index is 46.27 kg/m. Physical  Exam Constitutional:      Appearance: She is obese.  HENT:     Head: Normocephalic and atraumatic.     Nose: Nose normal.     Mouth/Throat:     Mouth: Mucous membranes are moist.  Eyes:     Conjunctiva/sclera: Conjunctivae normal.  Cardiovascular:     Rate and Rhythm: Normal rate and regular rhythm.  Pulmonary:     Effort: Pulmonary effort is normal.     Breath sounds: Normal breath sounds.  Abdominal:     General: Bowel sounds are normal.     Palpations: Abdomen is soft.  Musculoskeletal:        General: Normal range of motion.     Cervical back: Normal range of motion.  Skin:    General: Skin is warm and dry.  Neurological:     General: No focal deficit present.     Mental Status: She is alert and oriented to person, place, and time.  Psychiatric:        Mood and Affect: Mood normal.        Behavior: Behavior normal.        Thought Content: Thought content normal.        Judgment: Judgment normal.     Labs reviewed: Basic Metabolic Panel: No results for input(s): "NA", "K", "CL", "CO2", "GLUCOSE", "BUN", "CREATININE", "CALCIUM", "MG", "PHOS", "TSH" in the last 8760 hours. Liver Function Tests: No results for input(s): "AST", "ALT", "ALKPHOS", "BILITOT", "PROT", "ALBUMIN" in the last 8760 hours. No results for input(s): "LIPASE", "AMYLASE" in the last 8760 hours. No results for input(s): "AMMONIA" in the last 8760 hours. CBC: Recent Labs    01/10/23 0929 02/28/23 1144  WBC 4.5 4.7  NEUTROABS 2,246 2,453  HGB 14.2 13.7  HCT 43.1 41.3  MCV 88.9 88.4  PLT 123* 137*   Lipid Panel: Recent Labs    01/10/23 0929  CHOL 189  HDL 54  LDLCALC 113*  TRIG 112  CHOLHDL 3.5   Lab Results  Component Value Date   HGBA1C 5.4 04/06/2022    Procedures since last visit: No results found.  Assessment/Plan  1.. Morbidly obese (HCC) -  Weight decreased from 253 lbs to 245 lbs. Interested in Zepbound for weight loss in sleep apnea patients. - Prescribe Zepbound 2.5  mg once a week for four doses. - Submit preauthorization request for Zepbound with insurance. - tirzepatide (ZEPBOUND) 2.5 MG/0.5ML injection vial; Inject 2.5 mg into the skin once a week.  Dispense: 0.5 mL; Refill: 3  2. OSA (obstructive sleep apnea) (Primary) -  Managed with CPAP. Reports feeling better and more rested.  3. Vapes nicotine containing substance -  Vapes nicotine daily. Advised to reduce vaping due to lung health impact. - Advise to decrease vaping.  4. Gastroesophageal reflux disease without esophagitis -  Using Protonix with no reported issues.  5. Moderate major depression (HCC) -  On Zoloft with improved symptoms. Notices difference if dose missed.  6. Primary insomnia -  Uses Ambien as needed, effective for sleep. Contract for Ambien use necessary. - Refill Ambien prescription. - Create a contract for Ambien use. - zolpidem (AMBIEN) 10 MG tablet; Take 1 tablet (10 mg total) by mouth at bedtime as needed for sleep.  Dispense: 30 tablet; Refill: 0  7. Seasonal allergies -  Uses Zyrtec. Symptoms starting early this year.    General Health Maintenance Due for Pap smear. Last labs normal except slightly low platelet count. Fasting labs due in June. - Schedule Pap smear with Dr. Newt Minion. - Perform fasting labs in June.  Follow-up - Schedule follow-up appointment in three months.     Labs/tests ordered:  None     Acie Custis Medina-Vargas, NP

## 2023-10-17 NOTE — Telephone Encounter (Signed)
 Forms completed and given to Alondra at front-desk.

## 2023-10-17 NOTE — Telephone Encounter (Signed)
 Noted.

## 2023-10-25 ENCOUNTER — Telehealth: Payer: Self-pay

## 2023-10-25 NOTE — Telephone Encounter (Signed)
 Copied from CRM [id number]. Topic: Clinical - Prescription Issue >> Oct 24, 2023  1:18 PM Suzette B wrote: Reason for CRM: tirzepatide (ZEPBOUND) 2.5 MG/0.5ML injection vial, patient states she was informed she needs the injections and not the vial, and a prior auth has to be done to show its the injections. She is needing to have this complete before end of week due to out of town travel.

## 2023-10-25 NOTE — Telephone Encounter (Signed)
 Fax received through [place]. In the process of being completed.

## 2023-10-25 NOTE — Telephone Encounter (Signed)
 Message from Plan CaseId:[id number];Status:Approved;Review Type:Prior Auth;Coverage Start Date:09/25/2023;Coverage End Date:10/24/2024;. Authorization Expiration Date: October 24, 2024.

## 2023-11-02 ENCOUNTER — Encounter: Payer: Self-pay | Admitting: Internal Medicine

## 2023-11-04 ENCOUNTER — Other Ambulatory Visit: Payer: Self-pay | Admitting: Adult Health

## 2023-11-04 DIAGNOSIS — K219 Gastro-esophageal reflux disease without esophagitis: Secondary | ICD-10-CM

## 2023-11-04 DIAGNOSIS — F321 Major depressive disorder, single episode, moderate: Secondary | ICD-10-CM

## 2023-11-12 ENCOUNTER — Other Ambulatory Visit: Payer: Self-pay | Admitting: Adult Health

## 2023-11-12 DIAGNOSIS — F5101 Primary insomnia: Secondary | ICD-10-CM

## 2023-11-14 NOTE — Telephone Encounter (Signed)
 eRx for Ambien was sent to pharmacy.

## 2023-11-14 NOTE — Telephone Encounter (Signed)
 Patient is requesting a refill of the following medications: Requested Prescriptions   Pending Prescriptions Disp Refills   zolpidem (AMBIEN) 10 MG tablet [Pharmacy Med Name: ZOLPIDEM TARTRATE 10 MG TABLET] 30 tablet 0    Sig: TAKE 1 TABLET BY MOUTH AT BEDTIME AS NEEDED FOR SLEEP.    Date of last refill: 10/13/23  Refill amount: 30  Treatment agreement date: March 2025

## 2023-12-08 ENCOUNTER — Telehealth: Payer: Self-pay

## 2023-12-08 ENCOUNTER — Other Ambulatory Visit: Payer: Self-pay | Admitting: Adult Health

## 2023-12-08 MED ORDER — TIRZEPATIDE-WEIGHT MANAGEMENT 5 MG/0.5ML ~~LOC~~ SOLN: 5.0000 mg | SUBCUTANEOUS | 3 refills | Status: DC

## 2023-12-08 NOTE — Telephone Encounter (Signed)
 I have sent eRx to [facility]. Pls schedule a follow up visit so we can check on your labs. I am glad your weight is going down!

## 2023-12-08 NOTE — Telephone Encounter (Signed)
 Pt has follow up  on  01/23/24 , did you want see her sooner

## 2023-12-08 NOTE — Telephone Encounter (Signed)
 Copied from CRM [id number]. Topic: Clinical - Medication Question >> Dec 08, 2023  2:46 PM Brynn Caras wrote: Reason for CRM: The patient is requesting to increase her dosage of Zepbound from 2.5 MG to 5 MG. The patient states since she started taking 2.5 MG on 10/13/2023 until now she has lost 16-17 lbs. But she has noticed her weight has been stagnant and would like to continue losing her weight. She states her preferred pharmacy is: [facility] - [address], [place], [place] [address] Phone: [phone]. Please Advise  Message sent to Medina-Vargas, Monina C, NP

## 2023-12-09 NOTE — Telephone Encounter (Signed)
 That is fine

## 2023-12-12 ENCOUNTER — Encounter: Payer: Self-pay | Admitting: Adult Health

## 2023-12-13 ENCOUNTER — Telehealth: Payer: Self-pay

## 2023-12-13 ENCOUNTER — Other Ambulatory Visit: Payer: Self-pay | Admitting: Adult Health

## 2023-12-13 DIAGNOSIS — G4733 Obstructive sleep apnea (adult) (pediatric): Secondary | ICD-10-CM | POA: Diagnosis not present

## 2023-12-13 MED ORDER — TIRZEPATIDE-WEIGHT MANAGEMENT 5 MG/0.5ML ~~LOC~~ SOLN: 5.0000 mg | SUBCUTANEOUS | 12 refills | Status: DC

## 2023-12-13 NOTE — Telephone Encounter (Signed)
 I have sent eRX of Zepbound 5 mg to [facility] on 12/08/23. FYI.

## 2023-12-13 NOTE — Telephone Encounter (Signed)
MyChart message sent to patient with PCP response.

## 2023-12-13 NOTE — Telephone Encounter (Signed)
 I called and spoke to pharmacy [facility]. They stated that medication "Tirzepatide" doesn't specify if its Zepbound or Monjouro because they both have the same name. She also stated that she needs to know if you want to give patient pens or vials. She lastly stated that patient quantity is 0.47ml which is 1 pen and they come in a box of 4. She needs to know if you want patient to have 90 day supply or 30 day supply. She's requesting order be placed and sent in again. Message routed to PCP Medina-Vargas, Monina C, NP for approval.

## 2023-12-13 NOTE — Telephone Encounter (Signed)
 Zepbound re-ordered with refill of 12.

## 2023-12-13 NOTE — Telephone Encounter (Signed)
 Copied from CRM [id number]. Topic: Clinical - Prescription Issue >> Dec 13, 2023  1:57 PM Meghan Wilcox wrote: Reason for CRM: Patient called and stated that [facility] needs verbal consent and some questions answered before they will fill the rx, patient provided callback number [phone] [facility]. Patient stated that she takes her last shot today and could you please call and get this taken care of asap. Patient callback number is [phone].

## 2023-12-13 NOTE — Telephone Encounter (Signed)
 Spoke with Medina-Vargas, Monina C, NP and the patient over the phone to let her know that Medina-Vargas, Monina C, NP will get in contact with [facility] to do a verbal consent over the phone.

## 2023-12-13 NOTE — Telephone Encounter (Signed)
Message routed to PCP Medina-Vargas, Monina C, NP  

## 2023-12-15 ENCOUNTER — Other Ambulatory Visit: Payer: Self-pay | Admitting: Adult Health

## 2023-12-15 ENCOUNTER — Telehealth: Payer: Self-pay

## 2023-12-15 DIAGNOSIS — G4700 Insomnia, unspecified: Secondary | ICD-10-CM

## 2023-12-15 MED ORDER — TIRZEPATIDE-WEIGHT MANAGEMENT 5 MG/0.5ML ~~LOC~~ SOAJ: 5.0000 mg | SUBCUTANEOUS | 2 refills | Status: DC

## 2023-12-15 NOTE — Telephone Encounter (Signed)
Patient had no questions or concerns at this time.  

## 2023-12-15 NOTE — Telephone Encounter (Signed)
 Copied from CRM [id number]. Topic: Clinical - Prescription Issue >> Dec 15, 2023 10:41 AM Meghan Wilcox wrote: Reason for CRM: [phone] (H)patient called in regards to her tirzepatide 5 MG/0.5ML injection vial, she stated that she has been denied the medication because the provider wrote the prescription for the vials instead of the pens. They insurance specifically requires the pens same dosage but pens. She has requested it be expedited because it does take at least 7 calendar days to have it processed. Please call patient to update her on the status of correcting the medication. Please Advise   Message sent to Medina-Vargas, Monina C, NP

## 2023-12-15 NOTE — Telephone Encounter (Signed)
 tirzepatide (ZEPBOUND) 5 MG/0.5ML Pen 3 mL 2 12/15/2023 --  Inject 5 mg into the skin once a week. - Subcutaneous   [facility]

## 2023-12-15 NOTE — Telephone Encounter (Signed)
 Patient has request refill on medication that has been discontinued. Medication pend and sent to PCP Medina-Vargas, Monina C, NP

## 2023-12-15 NOTE — Telephone Encounter (Signed)
 eRx for Zepbound pen sent.

## 2023-12-19 ENCOUNTER — Encounter: Payer: Self-pay | Admitting: Adult Health

## 2023-12-19 DIAGNOSIS — F321 Major depressive disorder, single episode, moderate: Secondary | ICD-10-CM

## 2023-12-19 DIAGNOSIS — K219 Gastro-esophageal reflux disease without esophagitis: Secondary | ICD-10-CM

## 2023-12-19 MED ORDER — TIRZEPATIDE-WEIGHT MANAGEMENT 5 MG/0.5ML ~~LOC~~ SOAJ: 5.0000 mg | SUBCUTANEOUS | 2 refills | Status: DC

## 2023-12-19 MED ORDER — SERTRALINE HCL 50 MG PO TABS
50.0000 mg | ORAL_TABLET | Freq: Every day | ORAL | 1 refills | Status: DC
Start: 2023-12-19 — End: 2024-05-30

## 2023-12-19 MED ORDER — PANTOPRAZOLE SODIUM 40 MG PO TBEC: 40.0000 mg | DELAYED_RELEASE_TABLET | Freq: Every day | ORAL | 1 refills | Status: DC

## 2023-12-19 NOTE — Telephone Encounter (Signed)
 Pended Rx's and changed pharmacy.  Forwarded to Monina for approval.

## 2024-01-10 ENCOUNTER — Other Ambulatory Visit: Payer: Self-pay | Admitting: Adult Health

## 2024-01-10 DIAGNOSIS — G4700 Insomnia, unspecified: Secondary | ICD-10-CM

## 2024-01-10 NOTE — Telephone Encounter (Signed)
 High risk warning

## 2024-01-11 DIAGNOSIS — Z01419 Encounter for gynecological examination (general) (routine) without abnormal findings: Secondary | ICD-10-CM | POA: Diagnosis not present

## 2024-01-11 DIAGNOSIS — Z1231 Encounter for screening mammogram for malignant neoplasm of breast: Secondary | ICD-10-CM | POA: Diagnosis not present

## 2024-01-11 DIAGNOSIS — F39 Unspecified mood [affective] disorder: Secondary | ICD-10-CM | POA: Diagnosis not present

## 2024-01-11 LAB — HM MAMMOGRAPHY

## 2024-01-13 DIAGNOSIS — G4733 Obstructive sleep apnea (adult) (pediatric): Secondary | ICD-10-CM | POA: Diagnosis not present

## 2024-01-23 ENCOUNTER — Encounter: Payer: Self-pay | Admitting: Adult Health

## 2024-01-23 ENCOUNTER — Ambulatory Visit: Admitting: Adult Health

## 2024-01-23 DIAGNOSIS — Z1212 Encounter for screening for malignant neoplasm of rectum: Secondary | ICD-10-CM

## 2024-01-23 DIAGNOSIS — Z1211 Encounter for screening for malignant neoplasm of colon: Secondary | ICD-10-CM

## 2024-01-23 DIAGNOSIS — G4733 Obstructive sleep apnea (adult) (pediatric): Secondary | ICD-10-CM

## 2024-01-23 DIAGNOSIS — Z124 Encounter for screening for malignant neoplasm of cervix: Secondary | ICD-10-CM

## 2024-01-23 DIAGNOSIS — E7800 Pure hypercholesterolemia, unspecified: Secondary | ICD-10-CM

## 2024-01-23 DIAGNOSIS — Z1231 Encounter for screening mammogram for malignant neoplasm of breast: Secondary | ICD-10-CM

## 2024-01-23 DIAGNOSIS — F5101 Primary insomnia: Secondary | ICD-10-CM

## 2024-01-23 DIAGNOSIS — K219 Gastro-esophageal reflux disease without esophagitis: Secondary | ICD-10-CM

## 2024-01-23 DIAGNOSIS — F321 Major depressive disorder, single episode, moderate: Secondary | ICD-10-CM

## 2024-01-23 NOTE — Progress Notes (Unsigned)
 [facility] clinic  Provider:  Jereld Serum DNP  Code Status: ***  Goals of Care:     10/13/2023    8:46 AM  Advanced Directives  Does Patient Have a Medical Advance Directive? No  Would patient like information on creating a medical advance directive? No - Patient declined     Chief Complaint  Patient presents with   Medical Management of Chronic Issues    3 months, In addtition needs to discuss Cervical Cancer Screening ( patient just had her yearly screening will send via EP[facility] fax) , Mammogram, Colonoscopy and Pneumonia vaccine. ( Orders are pending)    HPI: Patient is a 48 y.o. female seen today for an acute visit for Discussed the use of AI scribe software for clinical note transcription with the patient, who gave verbal consent to proceed.  Wt Readings from Last 3 Encounters:  01/23/24 240 lb 3.2 oz (109 kg)  10/12/23 245 lb 12.8 oz (111.5 kg)  08/25/23 253 lb (114.8 kg)    History of Present Illness   Lab Results  Component Value Date   HGBA1C 5.4 04/06/2022    Lab Results  Component Value Date   CHOL 189 01/10/2023   HDL 54 01/10/2023   LDLCALC 113 (H) 01/10/2023   TRIG 112 01/10/2023   CHOLHDL 3.5 01/10/2023     Past Medical History:  Diagnosis Date   Allergy     seasonal   Blood transfusion age 12yrs   Antibody scewwn neg. 08/20/10 prior to cholecystectomy   Cancer (HCC) 2018   2 spots melanoma removed under left breast   Cervical mass    Chronic back pain    Diverticulitis    Family history of malignant neoplasm of gastrointestinal tract    Fatty liver    Gastroparesis    GERD (gastroesophageal reflux disease)    Hepatomegaly    History of ovarian cyst    Scoliosis    Seizures (HCC)    post partum  eclampsia  22 yrs ago    Past Surgical History:  Procedure Laterality Date   BLADDER SUSPENSION  10/01/2011   Procedure: TRANSVAGINAL TAPE (TVT) PROCEDURE;  Surgeon: Krystal JONETTA Deaner, MD;  Location: [facility];  Service: Gynecology;   Laterality: N/A;  Solyx Sling   CERVICAL POLYPECTOMY N/A 11/21/2020   Procedure: CERVICAL POLYPECTOMY/REMOVAL OF MASS;  Surgeon: Deaner Krystal, MD;  Location: [facility];  Service: Gynecology;  Laterality: N/A;   CHOLECYSTECTOMY  08/2010   Dr Jeoffrey Dawn   CHOLESTEATOMA EXCISION  1985   CYSTOSCOPY  10/01/2011   Procedure: CYSTOSCOPY;  Surgeon: Krystal JONETTA Deaner, MD;  Location: [facility];  Service: Gynecology;  Laterality: N/A;   INNER EAR SURGERY Left age 16   KNEE SURGERY Left age 65   arthroscopic   LAPAROSCOPY  10/01/2011   Procedure: LAPAROSCOPY OPERATIVE;  Surgeon: Krystal JONETTA Deaner, MD;  Location: [facility];  Service: Gynecology;  Laterality: N/A;  removal of right fimbria, fulgeration of endometriosis   PERINEAL LACERATION REPAIR N/A 12/12/2020   Procedure: CERVICAL LACERATION REPAIR;  Surgeon: Sudie Lavonia HERO, MD;  Location: [facility];  Service: Gynecology;  Laterality: N/A;   Steel rod in her back  age 28   scoliosis   TONSILLECTOMY  as child   and adenoids   TUBAL LIGATION  april 31 2007   tubes in ears     childhood   uterine ablation  2008   WISDOM TOOTH EXTRACTION  2000    No Known Allergies  Outpatient Encounter Medications as of 01/23/2024  Medication Sig   acetaminophen  (TYLENOL ) 500 MG tablet Take 1,000 mg by mouth at bedtime as needed.   cetirizine (ZYRTEC) 10 MG tablet Take 10 mg by mouth at bedtime.   NON FORMULARY Take 1 Piece by mouth as needed. 1 CBD gummy for sleep as needed.   pantoprazole  (PROTONIX ) 40 MG tablet Take 1 tablet (40 mg total) by mouth daily.   sertraline  (ZOLOFT ) 50 MG tablet Take 1 tablet (50 mg total) by mouth daily.   tirzepatide  (ZEPBOUND ) 5 MG/0.5ML Pen Inject 5 mg into the skin once a week.   zolpidem  (AMBIEN ) 5 MG tablet TAKE 2 TABLETS BY MOUTH AT BEDTIME AS NEEDED FOR SLEEP   No facility-administered encounter medications on file as of 01/23/2024.    Review of Systems:  Review of Systems  Constitutional:  Negative for  appetite change, chills, fatigue and fever.  HENT:  Negative for congestion, hearing loss, rhinorrhea and sore throat.   Eyes: Negative.   Respiratory:  Negative for cough, shortness of breath and wheezing.   Cardiovascular:  Negative for chest pain, palpitations and leg swelling.  Gastrointestinal:  Negative for abdominal pain, constipation, diarrhea, nausea and vomiting.  Genitourinary:  Negative for dysuria.  Musculoskeletal:  Negative for arthralgias, back pain and myalgias.  Skin:  Negative for color change, rash and wound.  Neurological:  Negative for dizziness, weakness and headaches.  Psychiatric/Behavioral:  Negative for behavioral problems. The patient is not nervous/anxious.     Health Maintenance  Topic Date Due   Cervical Cancer Screening (HPV/Pap Cotest)  Never done   Pneumococcal Vaccine 56-103 Years old (2 of 2 - PCV) 02/27/2016   MAMMOGRAM  12/14/2019   Colonoscopy  09/12/2023   COVID-19 Vaccine (1 - 2024-25 season) 03/02/2024 (Originally 04/03/2023)   DTaP/Tdap/Td (2 - Td or Tdap) 10/12/2024 (Originally 09/10/2019)   INFLUENZA VACCINE  03/02/2024   Hepatitis C Screening  Completed   HIV Screening  Completed   HPV VACCINES  Aged Out   Meningococcal B Vaccine  Aged Out    Physical Exam: Vitals:   01/23/24 0828  BP: 128/76  Pulse: 69  Resp: 18  Temp: (!) 97.5 F (36.4 C)  SpO2: 93%  Weight: 240 lb 3.2 oz (109 kg)  Height: 5' 2 (1.575 m)   Body mass index is 43.93 kg/m. Physical Exam Constitutional:      Appearance: She is obese.  HENT:     Head: Normocephalic and atraumatic.     Nose: Nose normal.     Mouth/Throat:     Mouth: Mucous membranes are moist.   Eyes:     Conjunctiva/sclera: Conjunctivae normal.    Cardiovascular:     Rate and Rhythm: Normal rate and regular rhythm.  Pulmonary:     Effort: Pulmonary effort is normal.     Breath sounds: Normal breath sounds.  Abdominal:     General: Bowel sounds are normal.     Palpations: Abdomen is  soft.   Musculoskeletal:        General: Normal range of motion.     Cervical back: Normal range of motion.   Skin:    General: Skin is warm and dry.   Neurological:     General: No focal deficit present.     Mental Status: She is alert and oriented to person, place, and time.   Psychiatric:        Mood and Affect: Mood normal.  Behavior: Behavior normal.        Thought Content: Thought content normal.        Judgment: Judgment normal.     Labs reviewed: Basic Metabolic Panel: No results for input(s): NA, K, CL, CO2, GLUCOSE, BUN, CREATININE, CALCIUM , MG, PHOS, TSH in the last 8760 hours. Liver Function Tests: No results for input(s): AST, ALT, ALKPHOS, BILITOT, PROT, ALBUMIN in the last 8760 hours. No results for input(s): LIPASE, AMYLASE in the last 8760 hours. No results for input(s): AMMONIA in the last 8760 hours. CBC: Recent Labs    02/28/23 1144  WBC 4.7  NEUTROABS 2,453  HGB 13.7  HCT 41.3  MCV 88.4  PLT 137*   Lipid Panel: No results for input(s): CHOL, HDL, LDLCALC, TRIG, CHOLHDL, LDLDIRECT in the last 8760 hours. Lab Results  Component Value Date   HGBA1C 5.4 04/06/2022    Procedures since last visit: No results found.  Assessment/Plan Assessment and Plan Assessment & Plan     ***   Labs/tests ordered:  * No order type specified *   No follow-ups on file.  Meghan Hinderliter Medina-Vargas, NP

## 2024-01-24 ENCOUNTER — Ambulatory Visit: Payer: Self-pay | Admitting: Adult Health

## 2024-01-24 LAB — CBC WITH DIFFERENTIAL/PLATELET
Absolute Lymphocytes: 1125 {cells}/uL (ref 850–3900)
Absolute Monocytes: 241 {cells}/uL (ref 200–950)
Basophils Absolute: 11 {cells}/uL (ref 0–200)
Basophils Relative: 0.3 %
Eosinophils Absolute: 100 {cells}/uL (ref 15–500)
Eosinophils Relative: 2.7 %
HCT: 43.4 % (ref 35.0–45.0)
Hemoglobin: 13.6 g/dL (ref 11.7–15.5)
MCH: 28.5 pg (ref 27.0–33.0)
MCHC: 31.3 g/dL — ABNORMAL LOW (ref 32.0–36.0)
MCV: 90.8 fL (ref 80.0–100.0)
MPV: 10.8 fL (ref 7.5–12.5)
Monocytes Relative: 6.5 %
Neutro Abs: 2224 {cells}/uL (ref 1500–7800)
Neutrophils Relative %: 60.1 %
Platelets: 106 10*3/uL — ABNORMAL LOW (ref 140–400)
RBC: 4.78 10*6/uL (ref 3.80–5.10)
RDW: 13.3 % (ref 11.0–15.0)
Total Lymphocyte: 30.4 %
WBC: 3.7 10*3/uL — ABNORMAL LOW (ref 3.8–10.8)

## 2024-01-24 LAB — LIPID PANEL
Cholesterol: 185 mg/dL (ref <200)
HDL: 49 mg/dL — ABNORMAL LOW (ref >=50)
LDL Cholesterol (Calc): 113 mg/dL — ABNORMAL HIGH
Non-HDL Cholesterol (Calc): 136 mg/dL — ABNORMAL HIGH (ref <130)
Total CHOL/HDL Ratio: 3.8 (calc) (ref <5.0)
Triglycerides: 121 mg/dL (ref <150)

## 2024-01-24 LAB — COMPLETE METABOLIC PANEL WITHOUT GFR
AG Ratio: 1.2 (calc) (ref 1.0–2.5)
ALT: 11 U/L (ref 6–29)
AST: 18 U/L (ref 10–35)
Albumin: 4 g/dL (ref 3.6–5.1)
Alkaline phosphatase (APISO): 67 U/L (ref 31–125)
BUN/Creatinine Ratio: 13 (calc) (ref 6–22)
BUN: 13 mg/dL (ref 7–25)
CO2: 26 mmol/L (ref 20–32)
Calcium: 8.8 mg/dL (ref 8.6–10.2)
Chloride: 105 mmol/L (ref 98–110)
Creat: 1.01 mg/dL — ABNORMAL HIGH (ref 0.50–0.99)
Globulin: 3.4 g/dL (ref 1.9–3.7)
Glucose, Bld: 84 mg/dL (ref 65–99)
Potassium: 4 mmol/L (ref 3.5–5.3)
Sodium: 139 mmol/L (ref 135–146)
Total Bilirubin: 0.5 mg/dL (ref 0.2–1.2)
Total Protein: 7.4 g/dL (ref 6.1–8.1)

## 2024-01-24 LAB — HEMOGLOBIN A1C
Hgb A1c MFr Bld: 5.4 % (ref <5.7)
Mean Plasma Glucose: 108 mg/dL
eAG (mmol/L): 6 mmol/L

## 2024-01-24 NOTE — Progress Notes (Signed)
-     Electrolytes, A1c, normal -   No anemia -   LDL 113, same as 1 year, continue to exercise for at least 150 minutes aerobic, increase vegetable intake

## 2024-01-27 DIAGNOSIS — G4733 Obstructive sleep apnea (adult) (pediatric): Secondary | ICD-10-CM | POA: Diagnosis not present

## 2024-02-09 ENCOUNTER — Other Ambulatory Visit: Payer: Self-pay | Admitting: Adult Health

## 2024-02-09 DIAGNOSIS — G4700 Insomnia, unspecified: Secondary | ICD-10-CM

## 2024-02-09 NOTE — Telephone Encounter (Signed)
 Patient is requesting a refill of the following medications: Requested Prescriptions   Pending Prescriptions Disp Refills   zolpidem  (AMBIEN ) 5 MG tablet [Pharmacy Med Name: zolpidem  5 mg tablet] 60 tablet 0    Sig: TAKE 2 TABLETS BY MOUTH AT BEDTIME AS NEEDED FOR SLEEP    Date of last refill: 01/10/2024  Refill amount: 0  Treatment agreement date: 10/13/2023

## 2024-02-12 DIAGNOSIS — G4733 Obstructive sleep apnea (adult) (pediatric): Secondary | ICD-10-CM | POA: Diagnosis not present

## 2024-02-15 ENCOUNTER — Telehealth: Admitting: Physician Assistant

## 2024-02-15 DIAGNOSIS — K5792 Diverticulitis of intestine, part unspecified, without perforation or abscess without bleeding: Secondary | ICD-10-CM

## 2024-02-15 DIAGNOSIS — R1030 Lower abdominal pain, unspecified: Secondary | ICD-10-CM

## 2024-02-15 MED ORDER — CIPROFLOXACIN HCL 500 MG PO TABS: 500.0000 mg | ORAL_TABLET | Freq: Two times a day (BID) | ORAL | 0 refills | Status: AC

## 2024-02-15 MED ORDER — METRONIDAZOLE 500 MG PO TABS: 500.0000 mg | ORAL_TABLET | Freq: Three times a day (TID) | ORAL | 0 refills | Status: AC

## 2024-02-15 NOTE — Progress Notes (Signed)
 Virtual Visit Consent   Meghan Wilcox, you are scheduled for a virtual visit with a [facility] provider today. Just as with appointments in the office, your consent must be obtained to participate. Your consent will be active for this visit and any virtual visit you may have with one of our providers in the next 365 days. If you have a MyChart account, a copy of this consent can be sent to you electronically.  As this is a virtual visit, video technology does not allow for your provider to perform a traditional examination. This may limit your provider's ability to fully assess your condition. If your provider identifies any concerns that need to be evaluated in person or the need to arrange testing (such as labs, EKG, etc.), we will make arrangements to do so. Although advances in technology are sophisticated, we cannot ensure that it will always work on either your end or our end. If the connection with a video visit is poor, the visit may have to be switched to a telephone visit. With either a video or telephone visit, we are not always able to ensure that we have a secure connection.  By engaging in this virtual visit, you consent to the provision of healthcare and authorize for your insurance to be billed (if applicable) for the services provided during this visit. Depending on your insurance coverage, you may receive a charge related to this service.  I need to obtain your verbal consent now. Are you willing to proceed with your visit today? Meghan Wilcox has provided verbal consent on 02/15/2024 for a virtual visit (video or telephone). Meghan CHRISTELLA Dickinson, PA-C  Date: 02/15/2024 12:14 PM   Virtual Visit via Video Note   I, Meghan Wilcox, connected with  Meghan Wilcox  ([id number], [birth year]) on 02/15/24 at 12:15 PM EDT by a video-enabled telemedicine application and verified that I am speaking with the correct person using two identifiers.  Location: Patient: Virtual Visit  Location Patient: Home Provider: Virtual Visit Location Provider: Home Office   I discussed the limitations of evaluation and management by telemedicine and the availability of in person appointments. The patient expressed understanding and agreed to proceed.    History of Present Illness: Meghan Wilcox is a 48 y.o. who identifies as a female who was assigned female at birth, and is being seen today for diverticulitis flare.  HPI: Abdominal Pain This is a recurrent problem. The current episode started yesterday (Last flare was in 2024). The onset quality is sudden. The problem occurs constantly. The problem has been gradually worsening. The pain is located in the LUQ and LLQ (mid to upper back on the left). The pain is mild. The quality of the pain is cramping, a sensation of fullness and sharp. The abdominal pain does not radiate. Associated symptoms include diarrhea. Pertinent negatives include no constipation, fever, flatus, headaches, hematochezia, melena, nausea or vomiting. Associated symptoms comments: bloating. The pain is aggravated by movement. The pain is relieved by Nothing. She has tried acetaminophen  for the symptoms. The treatment provided no relief. Prior diagnostic workup includes CT scan. diverticulitis     Problems:  Patient Active Problem List   Diagnosis Date Noted   Menopausal symptom 12/24/2022   Watery eyes 06/28/2022   Other allergic rhinitis 06/28/2022   GAD (generalized anxiety disorder) 01/16/2019      01/16/2019   Cellulitis and abscess of leg 09/22/2018   Smoker 09/19/2018   Hiatal hernia 06/12/2018   Healthcare maintenance  05/09/2018   BMI 40.0-44.9, adult (HCC) 05/09/2018   Plantar fasciitis of right foot 02/11/2015   Deformity of metatarsal bone of right foot 02/11/2015   Equinus deformity of foot, acquired 02/11/2015   Diarrhea 08/22/2014   Colitis, acute 08/21/2014   Leukocytosis 08/21/2014   Chest pain 03/31/2014   Ventral hernia 11/12/2013   SUI  (stress urinary incontinence, female) 10/01/2011   Pelvic pain in female 10/01/2011    Allergies: No Known Allergies Medications:  Current Outpatient Medications:    acetaminophen  (TYLENOL ) 500 MG tablet, Take 1,000 mg by mouth at bedtime as needed., Disp: , Rfl:    cetirizine (ZYRTEC) 10 MG tablet, Take 10 mg by mouth at bedtime., Disp: , Rfl:    NON FORMULARY, Take 1 Piece by mouth as needed. 1 CBD gummy for sleep as needed., Disp: , Rfl:    pantoprazole  (PROTONIX ) 40 MG tablet, Take 1 tablet (40 mg total) by mouth daily., Disp: 90 tablet, Rfl: 1   sertraline  (ZOLOFT ) 50 MG tablet, Take 1 tablet (50 mg total) by mouth daily., Disp: 90 tablet, Rfl: 1   tirzepatide  (ZEPBOUND ) 5 MG/0.5ML Pen, Inject 5 mg into the skin once a week., Disp: 6 mL, Rfl: 2   zolpidem  (AMBIEN ) 5 MG tablet, TAKE 2 TABLETS BY MOUTH AT BEDTIME AS NEEDED FOR SLEEP, Disp: 60 tablet, Rfl: 0  Observations/Objective: Patient is well-developed, well-nourished in no acute distress.  Resting comfortably at home.  Head is normocephalic, atraumatic.  No labored breathing.  Speech is clear and coherent with logical content.  Patient is alert and oriented at baseline.    Assessment and Plan: There are no diagnoses linked to this encounter. - Suspect diverticulitis flare - Cipro  and Metronidazole  prescribed - Push fluids, electrolyte beverages - Liquid diet until pain subsides, then increase to soft/bland (BRAT) diet over next day, then increase diet as tolerated - Seek in person evaluation if not improving or symptoms worsen   Follow Up Instructions: I discussed the assessment and treatment plan with the patient. The patient was provided an opportunity to ask questions and all were answered. The patient agreed with the plan and demonstrated an understanding of the instructions.  A copy of instructions were sent to the patient via MyChart unless otherwise noted below.    The patient was advised to call back or seek an  in-person evaluation if the symptoms worsen or if the condition fails to improve as anticipated.    Meghan CHRISTELLA Dickinson, PA-C

## 2024-02-15 NOTE — Progress Notes (Signed)
   Thank you for the details you included in the comment boxes. Those details are very helpful in determining the best course of treatment for you and help us  to provide the best care.Because of concern for possible diverticulitis flare, which is not something we can properly assess or treat via e-visit, we recommend that you schedule a Virtual Urgent Care video visit in order for the provider to better assess what is going on.  The provider will be able to give you a more accurate diagnosis and treatment plan if we can more freely discuss your symptoms and with the addition of a virtual examination.   If you change your visit to a video visit, we will bill your insurance (similar to an office visit) and you will not be charged for this e-Visit. You will be able to stay at home and speak with the first available [facility] advanced practice provider. The link to do a video visit is in the drop down Menu tab of your Welcome screen in MyChart.

## 2024-02-15 NOTE — Patient Instructions (Signed)
 Meghan Wilcox, thank you for joining Delon CHRISTELLA Dickinson, PA-C for today's virtual visit.  While this provider is not your primary care provider (PCP), if your PCP is located in our provider database this encounter information will be shared with them immediately following your visit.   A [facility] MyChart account gives you access to today's visit and all your visits, tests, and labs performed at [facility]  click here if you don't have a [facility] MyChart account or go to [email or web address]  Consent: (Patient) Meghan Wilcox provided verbal consent for this virtual visit at the beginning of the encounter.  Current Medications:  Current Outpatient Medications:    ciprofloxacin  (CIPRO ) 500 MG tablet, Take 1 tablet (500 mg total) by mouth 2 (two) times daily for 10 days., Disp: 20 tablet, Rfl: 0   metroNIDAZOLE  (FLAGYL ) 500 MG tablet, Take 1 tablet (500 mg total) by mouth 3 (three) times daily for 10 days., Disp: 30 tablet, Rfl: 0   acetaminophen  (TYLENOL ) 500 MG tablet, Take 1,000 mg by mouth at bedtime as needed., Disp: , Rfl:    cetirizine (ZYRTEC) 10 MG tablet, Take 10 mg by mouth at bedtime., Disp: , Rfl:    NON FORMULARY, Take 1 Piece by mouth as needed. 1 CBD gummy for sleep as needed., Disp: , Rfl:    pantoprazole  (PROTONIX ) 40 MG tablet, Take 1 tablet (40 mg total) by mouth daily., Disp: 90 tablet, Rfl: 1   sertraline  (ZOLOFT ) 50 MG tablet, Take 1 tablet (50 mg total) by mouth daily., Disp: 90 tablet, Rfl: 1   tirzepatide  (ZEPBOUND ) 5 MG/0.5ML Pen, Inject 5 mg into the skin once a week., Disp: 6 mL, Rfl: 2   zolpidem  (AMBIEN ) 5 MG tablet, TAKE 2 TABLETS BY MOUTH AT BEDTIME AS NEEDED FOR SLEEP, Disp: 60 tablet, Rfl: 0   Medications ordered in this encounter:  Meds ordered this encounter  Medications   ciprofloxacin  (CIPRO ) 500 MG tablet    Sig: Take 1 tablet (500 mg total) by mouth 2 (two) times daily for 10 days.    Dispense:  20 tablet    Refill:  0     Supervising Provider:   LAMPTEY, PHILIP O [[id number]]   metroNIDAZOLE  (FLAGYL ) 500 MG tablet    Sig: Take 1 tablet (500 mg total) by mouth 3 (three) times daily for 10 days.    Dispense:  30 tablet    Refill:  0    Supervising Provider:   LAMPTEY, PHILIP O [[id number]]     *If you need refills on other medications prior to your next appointment, please contact your pharmacy*  Follow-Up: Call back or seek an in-person evaluation if the symptoms worsen or if the condition fails to improve as anticipated.  [facility] [phone]  Other Instructions  Diverticulitis  Diverticulitis happens when poop (stool) and bacteria get trapped in small pouches in the colon called diverticula. These pouches may form if you have a condition called diverticulosis. When the poop and bacteria get trapped, it can cause an infection and inflammation. Diverticulitis may cause severe stomach pain and diarrhea. It can also lead to tissue damage in your colon. This can cause bleeding or blockage. In some cases, the diverticula may burst (rupture). This can cause infected poop to go into other parts of your abdomen. What are the causes? This condition is caused by poop getting trapped in the diverticula. This allows bacteria to grow. It can lead to inflammation and infection. What increases  the risk? You are more likely to get this condition if you have diverticulosis. You are also more at risk if: You are overweight or obese. You do not get enough exercise. You drink alcohol. You smoke. You eat a lot of red meat, such as beef, pork, or lamb. You do not get enough fiber. Foods high in fiber include fruits, vegetables, beans, nuts, and whole grains. You are over 75 years of age. What are the signs or symptoms? Symptoms of this condition may include: Pain and tenderness in the abdomen. This pain is often felt on the left side but may occur in other spots. Fever and chills. Nausea and  vomiting. Cramping. Bloating. Changes in how often you poop. Blood in your poop. How is this diagnosed? This condition is diagnosed based on your medical history and a physical exam. You may also have tests done to make sure there is nothing else causing your condition. These tests may include: Blood tests. Tests done on your pee (urine). A CT scan of the abdomen. You may need to have a colonoscopy. This is an exam to look at your whole large intestine. During the exam, a tube is put into the opening of your butt (anus) and then moved into your rectum, colon, and other parts of the large intestine. This exam is done to look at the diverticula. It can also see if there is something else that may be causing your symptoms. How is this treated? Most cases are mild and can be treated at home. You may be told to: Take over-the-counter pain medicine. Only eat and drink clear liquids. Take antibiotics. Rest. More severe cases may need to be treated at a hospital. Treatment may include: Not eating or drinking. Taking pain medicines. Getting antibiotics through an IV. Getting fluids and nutrition through an IV. Surgery. Follow these instructions at home: Medicines Take over-the-counter and prescription medicines only as told by your health care provider. These include fiber supplements, probiotics, and medicines to soften your poop (stool softeners). If you were prescribed antibiotics, take them as told by your provider. Do not stop using the antibiotic even if you start to feel better. Ask your provider if the medicine prescribed to you requires you to avoid driving or using machinery. Eating and drinking  Follow the diet told by your provider. You may need to only eat and drink liquids. After your symptoms get better, you may be able to return to a more normal diet. You may be told to eat at least 25 grams (25 g) of fiber each day. Fiber makes it easier to poop. Healthy sources of fiber  include: Berries. One cup has 4-8 g of fiber. Beans or lentils. One-half cup has 5-8 g of fiber. Green vegetables. One cup has 4 g of fiber. Avoid eating red meat. General instructions Do not use any products that contain nicotine  or tobacco. These products include cigarettes, chewing tobacco, and vaping devices, such as e-cigarettes. If you need help quitting, ask your provider. Exercise for at least 30 minutes, 3 times a week. Exercise hard enough to raise your heart rate and break a sweat. Contact a health care provider if: Your pain gets worse. Your pooping does not go back to normal. Your symptoms do not get better with treatment. Your symptoms get worse all of a sudden. You have a fever. You vomit more than one time. Your poop is bloody, black, or tarry. This information is not intended to replace advice given to you by  your health care provider. Make sure you discuss any questions you have with your health care provider. Document Revised: 04/15/2022 Document Reviewed: 04/15/2022 Elsevier Patient Education  2024 Elsevier Inc.   If you have been instructed to have an in-person evaluation today at a local Urgent Care facility, please use the link below. It will take you to a list of all of our available [facility], including address, phone number and hours of operation. Please do not delay care.  [facility]  If you or a family member do not have a primary care provider, use the link below to schedule a visit and establish care. When you choose a [facility] primary care physician or advanced practice provider, you gain a long-term partner in health. Find a Primary Care Provider  Learn more about [facility]'s in-office and virtual care options: [facility] - Get Care Now

## 2024-02-22 ENCOUNTER — Encounter: Payer: Self-pay | Admitting: Internal Medicine

## 2024-02-29 ENCOUNTER — Encounter: Payer: Self-pay | Admitting: Adult Health

## 2024-02-29 ENCOUNTER — Other Ambulatory Visit: Payer: Self-pay | Admitting: Adult Health

## 2024-02-29 DIAGNOSIS — G4733 Obstructive sleep apnea (adult) (pediatric): Secondary | ICD-10-CM

## 2024-02-29 MED ORDER — TIRZEPATIDE-WEIGHT MANAGEMENT 7.5 MG/0.5ML ~~LOC~~ SOAJ: 7.5000 mg | SUBCUTANEOUS | 1 refills | Status: DC

## 2024-02-29 NOTE — Telephone Encounter (Signed)
 [facility]

## 2024-03-02 NOTE — Telephone Encounter (Signed)
 I have sent patient MyChart message with update. [facility] has partnered with [employer]. Here a number for future references [phone]

## 2024-03-06 ENCOUNTER — Telehealth: Payer: Self-pay

## 2024-03-06 ENCOUNTER — Ambulatory Visit (AMBULATORY_SURGERY_CENTER): Admitting: *Deleted

## 2024-03-06 ENCOUNTER — Encounter: Payer: Self-pay | Admitting: Internal Medicine

## 2024-03-06 VITALS — Ht 62.0 in | Wt 231.0 lb

## 2024-03-06 DIAGNOSIS — Z800 Family history of malignant neoplasm of digestive organs: Secondary | ICD-10-CM

## 2024-03-06 DIAGNOSIS — K219 Gastro-esophageal reflux disease without esophagitis: Secondary | ICD-10-CM

## 2024-03-06 DIAGNOSIS — Z860100 Personal history of colon polyps, unspecified: Secondary | ICD-10-CM

## 2024-03-06 MED ORDER — NA SULFATE-K SULFATE-MG SULF 17.5-3.13-1.6 GM/177ML PO SOLN: 1.0000 | Freq: Once | ORAL | 0 refills | Status: AC

## 2024-03-06 NOTE — Telephone Encounter (Signed)
 Noted, called [facility] back and was told that the 7.5 was covered with no issues.   Left message on voicemail for patient to return call when available, reason for call was to find out who told patient that rx would not be covered.

## 2024-03-06 NOTE — Telephone Encounter (Signed)
 Zepbound  7.5 mg not available per [facility] so she is using the 5 mg for now.

## 2024-03-06 NOTE — Progress Notes (Addendum)
 Pt's name and DOB verified at the beginning of the pre-visit with 2 identifiers   Pt denies any difficulty with ambulating,sitting, laying down or rolling side to side   Pt uses ambulation assistance device or has issues with mobiiity  Pt has no issues moving head neck or swallowing  No egg or soy allergy  known to patient   No issues known to pt with past sedation with any surgeries or procedures  No FH of Malignant Hyperthermia  Pt is not on home 02   Pt is not on blood thinners   Pt has frequent issues with constipation RN instructed pt to use Miralax per bottles instructions a week before prep days. Pt states they will  Pt is not on dialysis  Pt denise any abnormal heart rhythms   Pt denies any upcoming cardiac testing  Patient's chart reviewed by Norleen Schillings CNRA prior to pre-visit and patient appropriate for the [facility].  Pre-visit completed and red dot placed by patient's name on their procedure day (on provider's schedule).    Visit by phone  Pt states weight is 231 lb  Instructions reviewed. Pt given  both [facility] main # and MD on call # prior to instructions.  Informed pt to come in at the time discussed and that tit is an hour prior to the procedure time because of the need to place IV, change into gown, check and sign concent as well as meet CRNA and MD. Pt states they understand. paperwork s Pt states understanding after opportunity to ask questions the instructions given. Instructed pt to review instructions again prior to procedure and call main # given if has any questions.. Pt states they will.   Pt states that she is not taking the 7mg  of Zepbound  because her Insurance would not pay for it so she is taking the 5mg    Instructed pt on where to find instructions on My Chart.

## 2024-03-06 NOTE — Telephone Encounter (Signed)
 Spoke with representative from [facility] in regards to patients correct dosage, placed on hold while I confirmed with Medina-Vargas, Monina C, NP.   Per Monina patient should be on 7.5 mg of Zepbound , call resumed with [facility] representative aware of providers response.  I reached out to RN at [facility], as she updated the medication list to reflect 5 mg today. Per Orie with [facility], patient stated her insurance will not cover the 7.5 and that is why the list was updated to reflect the 5 mg.    Monina please advise on how to proceed

## 2024-03-07 MED ORDER — ZEPBOUND 7.5 MG/0.5ML ~~LOC~~ SOAJ: 7.5000 mg | SUBCUTANEOUS | Status: DC

## 2024-03-07 NOTE — Telephone Encounter (Signed)
 Outgoing call placed to patient requesting a return call to clarify how the zepbound  should be listed on her active medication list. I also sent patient a mychart message

## 2024-03-07 NOTE — Addendum Note (Signed)
 Addended by: SUELLEN COMINGS C on: 03/07/2024 01:00 PM   Modules accepted: Orders

## 2024-03-14 DIAGNOSIS — G4733 Obstructive sleep apnea (adult) (pediatric): Secondary | ICD-10-CM | POA: Diagnosis not present

## 2024-03-23 ENCOUNTER — Encounter: Payer: Self-pay | Admitting: Internal Medicine

## 2024-03-23 ENCOUNTER — Ambulatory Visit: Admitting: Internal Medicine

## 2024-03-23 VITALS — BP 107/65 | HR 71 | Temp 97.9°F | Resp 16 | Ht 62.0 in | Wt 231.0 lb

## 2024-03-23 DIAGNOSIS — K648 Other hemorrhoids: Secondary | ICD-10-CM | POA: Diagnosis not present

## 2024-03-23 DIAGNOSIS — Z1211 Encounter for screening for malignant neoplasm of colon: Secondary | ICD-10-CM | POA: Diagnosis not present

## 2024-03-23 DIAGNOSIS — Z860101 Personal history of adenomatous and serrated colon polyps: Secondary | ICD-10-CM

## 2024-03-23 DIAGNOSIS — D175 Benign lipomatous neoplasm of intra-abdominal organs: Secondary | ICD-10-CM

## 2024-03-23 DIAGNOSIS — Z860100 Personal history of colon polyps, unspecified: Secondary | ICD-10-CM

## 2024-03-23 DIAGNOSIS — K5730 Diverticulosis of large intestine without perforation or abscess without bleeding: Secondary | ICD-10-CM

## 2024-03-23 MED ORDER — SODIUM CHLORIDE 0.9 % IV SOLN: 500.0000 mL | INTRAVENOUS | Status: DC

## 2024-03-23 NOTE — Progress Notes (Signed)
 Report to PACU, RN, vss, BBS= Clear.

## 2024-03-23 NOTE — Progress Notes (Signed)
 HISTORY OF PRESENT ILLNESS:  Meghan Wilcox is a 48 y.o. female with a history of multiple adenomatous and sessile serrated polyps.  Now for surveillance colonoscopy  REVIEW OF SYSTEMS:  All non-GI ROS negative except for  Past Medical History:  Diagnosis Date   Allergy     seasonal   Blood transfusion age 72yrs   Antibody scewwn neg. 08/20/10 prior to cholecystectomy   Blood transfusion without reported diagnosis    Cancer (HCC) 2018   2 spots melanoma removed under left breast   Cervical mass    Chronic back pain    Diverticulitis    Family history of malignant neoplasm of gastrointestinal tract    Fatty liver    Gastroparesis    GERD (gastroesophageal reflux disease)    Hepatomegaly    History of ovarian cyst    Scoliosis    Seizures (HCC)    post partum  eclampsia  22 yrs ago   Sleep apnea     Past Surgical History:  Procedure Laterality Date   BLADDER SUSPENSION  10/01/2011   Procedure: TRANSVAGINAL TAPE (TVT) PROCEDURE;  Surgeon: Krystal JONETTA Deaner, MD;  Location: [facility];  Service: Gynecology;  Laterality: N/A;  Solyx Sling   CERVICAL POLYPECTOMY N/A 11/21/2020   Procedure: CERVICAL POLYPECTOMY/REMOVAL OF MASS;  Surgeon: Deaner Krystal, MD;  Location: [facility] [place];  Service: Gynecology;  Laterality: N/A;   CHOLECYSTECTOMY  08/2010   Dr Jeoffrey Dawn   CHOLESTEATOMA EXCISION  1985   COLONOSCOPY     CYSTOSCOPY  10/01/2011   Procedure: CYSTOSCOPY;  Surgeon: Krystal JONETTA Deaner, MD;  Location: [facility];  Service: Gynecology;  Laterality: N/A;   INNER EAR SURGERY Left age 25   KNEE SURGERY Left age 71   arthroscopic   LAPAROSCOPY  10/01/2011   Procedure: LAPAROSCOPY OPERATIVE;  Surgeon: Krystal JONETTA Deaner, MD;  Location: [facility];  Service: Gynecology;  Laterality: N/A;  removal of right fimbria, fulgeration of endometriosis   PERINEAL LACERATION REPAIR N/A 12/12/2020   Procedure: CERVICAL LACERATION REPAIR;  Surgeon: Sudie Lavonia HERO, MD;  Location: [facility];   Service: Gynecology;  Laterality: N/A;   Steel rod in her back  age 18   scoliosis   TONSILLECTOMY  as child   and adenoids   TUBAL LIGATION  april 31 2007   tubes in ears     childhood   uterine ablation  2008   WISDOM TOOTH EXTRACTION  2000    Social History Meghan Wilcox  reports that she quit smoking about 4 years ago. Her smoking use included cigarettes. She started smoking about 34 years ago. She has a 30 pack-year smoking history. She has never used smokeless tobacco. She reports current alcohol use. She reports that she does not use drugs.  family history includes Cirrhosis in her father and mother; Colon cancer in her maternal uncle and another family member; Colon cancer (age of onset: 70) in her paternal grandfather; Crohn's disease in her mother; Diabetes in her brother, father, and mother; Heart disease in her father and mother; Irritable bowel syndrome in her mother; Liver disease in her father; Lung cancer in her mother; Rectal cancer in an other family member; Sleep apnea in her father and mother.  No Known Allergies     PHYSICAL EXAMINATION: Vital signs: BP 103/65   Pulse 65   Temp 97.9 F (36.6 C) (Temporal)   Ht 5' 2 (1.575 m)   Wt 231 lb (104.8 kg)   SpO2 92%  BMI 42.25 kg/m  General: Well-developed, well-nourished, no acute distress HEENT: Sclerae are anicteric, conjunctiva pink. Oral mucosa intact Lungs: Clear Heart: Regular Abdomen: soft, nontender, nondistended, no obvious ascites, no peritoneal signs, normal bowel sounds. No organomegaly. Extremities: No edema Psychiatric: alert and oriented x3. Cooperative     ASSESSMENT:  Personal history of multiple adenomatous and sessile serrated polyps   PLAN:  Surveillance colonoscopy

## 2024-03-23 NOTE — Progress Notes (Signed)
 Pt's states no medical or surgical changes since previsit or office visit.

## 2024-03-23 NOTE — Patient Instructions (Signed)

## 2024-03-23 NOTE — Op Note (Signed)
 [facility] Patient Name: Meghan Wilcox Procedure Date: 03/23/2024 8:43 AM MRN: [id number] Endoscopist: Norleen SAILOR. Abran , MD, [id number] Age: 48 Referring MD:  Date of Birth: 1975/09/07 Gender: Female Account #: [id number] Procedure:                Colonoscopy Indications:              High risk colon cancer surveillance: Personal                            history of multiple (3 or more) adenomas, High risk                            colon cancer surveillance: Personal history of                            sessile serrated colon polyp (less than 10 mm in                            size) with no dysplasia Medicines:                Monitored Anesthesia Care Procedure:                Pre-Anesthesia Assessment:                           - Prior to the procedure, a History and Physical                            was performed, and patient medications and                            allergies were reviewed. The patient's tolerance of                            previous anesthesia was also reviewed. The risks                            and benefits of the procedure and the sedation                            options and risks were discussed with the patient.                            All questions were answered, and informed consent                            was obtained. Prior Anticoagulants: The patient has                            taken no anticoagulant or antiplatelet agents. ASA                            Grade Assessment: II - A patient with mild systemic  disease. After reviewing the risks and benefits,                            the patient was deemed in satisfactory condition to                            undergo the procedure.                           After obtaining informed consent, the colonoscope                            was passed under direct vision. Throughout the                            procedure, the patient's blood pressure,  pulse, and                            oxygen saturations were monitored continuously. The                            CF [id number] #[id number] was introduced through the anus                            and advanced to the the cecum, identified by                            appendiceal orifice and ileocecal valve. The                            ileocecal valve, appendiceal orifice, and rectum                            were photographed. The quality of the bowel                            preparation was excellent. The colonoscopy was                            performed without difficulty. The patient tolerated                            the procedure well. The bowel preparation used was                            SUPREP via split dose instruction. Scope In: 9:07:11 AM Scope Out: 9:19:06 AM Scope Withdrawal Time: 0 hours 9 minutes 22 seconds  Total Procedure Duration: 0 hours 11 minutes 55 seconds  Findings:                 Many diverticula were found in the entire colon.                            Incidental lipoma transverse colon.  Internal hemorrhoids were found during retroflexion.                           The exam was otherwise without abnormality on                            direct and retroflexion views. Complications:            No immediate complications. Estimated blood loss:                            None. Estimated Blood Loss:     Estimated blood loss: none. Impression:               - Diverticulosis in the entire examined colon.                            Transverse colon lipoma.                           - Internal hemorrhoids.                           - The examination was otherwise normal on direct                            and retroflexion views.                           - No specimens collected. Recommendation:           - Repeat colonoscopy in 5 years for surveillance                            (personal history of multiple polyps).                            - Patient has a contact number available for                            emergencies. The signs and symptoms of potential                            delayed complications were discussed with the                            patient. Return to normal activities tomorrow.                            Written discharge instructions were provided to the                            patient.                           - Resume previous diet.                           -  Continue present medications. Norleen SAILOR. Abran, MD 03/23/2024 9:27:19 AM This report has been signed electronically.

## 2024-03-26 ENCOUNTER — Telehealth: Payer: Self-pay | Admitting: *Deleted

## 2024-03-26 NOTE — Telephone Encounter (Signed)
  Follow up Call-     03/23/2024    7:34 AM  Call back number  Post procedure Call Back phone  # [phone]  Permission to leave phone message Yes     Patient questions:  Do you have a fever, pain , or abdominal swelling? No. Pain Score  0 *  Have you tolerated food without any problems? Yes.    Have you been able to return to your normal activities? Yes.    Do you have any questions about your discharge instructions: Diet   No. Medications  No. Follow up visit  No.  Do you have questions or concerns about your Care? No.  Actions: * If pain score is 4 or above: No action needed, pain <4.

## 2024-03-30 ENCOUNTER — Other Ambulatory Visit: Payer: Self-pay | Admitting: Adult Health

## 2024-03-30 DIAGNOSIS — G4700 Insomnia, unspecified: Secondary | ICD-10-CM

## 2024-03-30 NOTE — Telephone Encounter (Signed)
 Patient is requesting a refill of the following medications: Requested Prescriptions   Pending Prescriptions Disp Refills   zolpidem  (AMBIEN ) 5 MG tablet [Pharmacy Med Name: zolpidem  5 mg tablet] 60 tablet 0    Sig: TAKE 2 TABLETS BY MOUTH AT BEDTIME AS NEEDED FOR SLEEP    Date of last refill: 02/09/2024  Refill amount: 60  Treatment agreement date: 10/13/2023

## 2024-05-04 ENCOUNTER — Other Ambulatory Visit: Payer: Self-pay | Admitting: Adult Health

## 2024-05-04 ENCOUNTER — Encounter: Payer: Self-pay | Admitting: Adult Health

## 2024-05-04 DIAGNOSIS — Z6841 Body Mass Index (BMI) 40.0 and over, adult: Secondary | ICD-10-CM

## 2024-05-04 MED ORDER — TIRZEPATIDE-WEIGHT MANAGEMENT 10 MG/0.5ML ~~LOC~~ SOLN: 10.0000 mg | SUBCUTANEOUS | 1 refills | Status: DC

## 2024-05-04 MED ORDER — TIRZEPATIDE-WEIGHT MANAGEMENT 10 MG/0.5ML ~~LOC~~ SOAJ: 10.0000 mg | SUBCUTANEOUS | 1 refills | Status: DC

## 2024-05-04 MED ORDER — TIRZEPATIDE 10 MG/0.5ML ~~LOC~~ SOAJ: 10.0000 mg | SUBCUTANEOUS | 1 refills | Status: DC

## 2024-05-04 NOTE — Telephone Encounter (Signed)
 Sent eRx to [facility] [facility]. Discussed with patient.

## 2024-05-04 NOTE — Progress Notes (Signed)
 Zepbound  10 mg pen Q weekly

## 2024-05-11 ENCOUNTER — Other Ambulatory Visit: Payer: Self-pay | Admitting: Adult Health

## 2024-05-11 DIAGNOSIS — G4700 Insomnia, unspecified: Secondary | ICD-10-CM

## 2024-05-11 NOTE — Telephone Encounter (Signed)
 Patient is requesting a refill of the following medications: Requested Prescriptions   Pending Prescriptions Disp Refills   zolpidem  (AMBIEN ) 5 MG tablet [Pharmacy Med Name: zolpidem  5 mg tablet] 60 tablet 0    Sig: TAKE 2 TABLETS BY MOUTH AT BEDTIME AS NEEDED FOR SLEEP    Date of last refill: 03/30/24  Refill amount: 60/0  Treatment agreement date: 10/13/23

## 2024-05-30 ENCOUNTER — Other Ambulatory Visit: Payer: Self-pay | Admitting: Adult Health

## 2024-05-30 DIAGNOSIS — K219 Gastro-esophageal reflux disease without esophagitis: Secondary | ICD-10-CM

## 2024-05-30 DIAGNOSIS — F321 Major depressive disorder, single episode, moderate: Secondary | ICD-10-CM

## 2024-06-14 ENCOUNTER — Other Ambulatory Visit: Payer: Self-pay | Admitting: Adult Health

## 2024-06-14 DIAGNOSIS — G4700 Insomnia, unspecified: Secondary | ICD-10-CM

## 2024-06-14 NOTE — Telephone Encounter (Signed)
 Pharmacy requested refill Epic LR: 05/11/2024 Contract Date: 10/13/2023  Pended Rx and sent to [facility] for approval.

## 2024-07-03 ENCOUNTER — Encounter: Payer: Self-pay | Admitting: Adult Health

## 2024-07-04 ENCOUNTER — Other Ambulatory Visit: Payer: Self-pay | Admitting: Adult Health

## 2024-07-04 DIAGNOSIS — Z6841 Body Mass Index (BMI) 40.0 and over, adult: Secondary | ICD-10-CM

## 2024-07-04 MED ORDER — TIRZEPATIDE-WEIGHT MANAGEMENT 12.5 MG/0.5ML ~~LOC~~ SOAJ: 12.5000 mg | SUBCUTANEOUS | 1 refills | Status: AC

## 2024-07-04 NOTE — Telephone Encounter (Signed)
 Sent eRx for Zepbound  to [facility].

## 2024-07-23 ENCOUNTER — Other Ambulatory Visit: Payer: Self-pay | Admitting: Adult Health

## 2024-07-23 ENCOUNTER — Ambulatory Visit: Payer: Self-pay | Admitting: Adult Health

## 2024-07-23 ENCOUNTER — Encounter: Payer: Self-pay | Admitting: Adult Health

## 2024-07-23 VITALS — BP 112/62 | HR 71 | Temp 97.7°F | Ht 62.0 in | Wt 232.0 lb

## 2024-07-23 DIAGNOSIS — K219 Gastro-esophageal reflux disease without esophagitis: Secondary | ICD-10-CM | POA: Diagnosis not present

## 2024-07-23 DIAGNOSIS — F5101 Primary insomnia: Secondary | ICD-10-CM

## 2024-07-23 DIAGNOSIS — G4700 Insomnia, unspecified: Secondary | ICD-10-CM

## 2024-07-23 DIAGNOSIS — E7800 Pure hypercholesterolemia, unspecified: Secondary | ICD-10-CM | POA: Diagnosis not present

## 2024-07-23 DIAGNOSIS — F321 Major depressive disorder, single episode, moderate: Secondary | ICD-10-CM | POA: Diagnosis not present

## 2024-07-23 DIAGNOSIS — G4733 Obstructive sleep apnea (adult) (pediatric): Secondary | ICD-10-CM | POA: Diagnosis not present

## 2024-07-23 MED ORDER — SERTRALINE HCL 50 MG PO TABS: 75.0000 mg | ORAL_TABLET | Freq: Every day | ORAL | 1 refills | Status: AC

## 2024-07-23 MED ORDER — SERTRALINE HCL 50 MG PO TABS: 75.0000 mg | ORAL_TABLET | Freq: Every day | ORAL | 1 refills | Status: DC

## 2024-07-23 NOTE — Progress Notes (Signed)
 "  [facility]  Provider:  Jereld Serum DNP  Code Status:  Full  Goals of Care:     10/13/2023    8:46 AM  Advanced Directives  Does Patient Have a Medical Advance Directive? No  Would patient like information on creating a medical advance directive? No - Patient declined     Chief Complaint  Patient presents with   Medical Management of Chronic Issues    6 month follow up     Discussed the use of AI scribe software for clinical note transcription with the patient, who gave verbal consent to proceed.  HPI: Patient is a 48 y.o. female seen today for a 62-month follow up of chronic medical issues.  She has moderate depression with a PHQ score of 14. She is currently taking Zoloft  50 mg daily and experiences increased irritability if a dose is missed. She has difficulty sitting still and focusing, particularly when trying to relax, and experiences heightened anxiety during family gatherings, leading to feelings of being overwhelmed.  Her sleep is managed with Ambien  5 mg, two tablets at bedtime as needed. She struggles to sleep without it, averaging about six hours of sleep per night. She has attempted to reduce her usage to prevent tolerance but finds it challenging to sleep without the medication. She uses a CPAP machine for sleep apnea.  She reports good control of her acid reflux with medication and avoids red sauce, pizza, and coffee to prevent symptoms. Symptoms recur if she misses a dose of her medication.  She has experienced significant weight loss, decreasing from 267 pounds to 230 pounds since March. However, she has reduced exercise due to cold weather. She does not consume vegetables regularly but eats salads and avoids junk food.  She continues to take Zyrtec 10 mg at bedtime for allergies and reports no issues with her blood pressure, which is well-controlled at 112/62. She does not smoke but vapes, and she does not consume alcohol. She has a history of elevated  LDL cholesterol, which was 113 six months ago, and she is not currently on medication for it.      Past Medical History:  Diagnosis Date   Allergy     seasonal   Blood transfusion age 85yrs   Antibody scewwn neg. 08/20/10 prior to cholecystectomy   Blood transfusion without reported diagnosis    Cancer (HCC) 2018   2 spots melanoma removed under left breast   Cervical mass    Chronic back pain    Diverticulitis    Family history of malignant neoplasm of gastrointestinal tract    Fatty liver    Gastroparesis    GERD (gastroesophageal reflux disease)    Hepatomegaly    History of ovarian cyst    Scoliosis    Seizures (HCC)    post partum  eclampsia  22 yrs ago   Sleep apnea     Past Surgical History:  Procedure Laterality Date   BLADDER SUSPENSION  10/01/2011   Procedure: TRANSVAGINAL TAPE (TVT) PROCEDURE;  Surgeon: Krystal JONETTA Deaner, MD;  Location: [facility];  Service: Gynecology;  Laterality: N/A;  Solyx Sling   CERVICAL POLYPECTOMY N/A 11/21/2020   Procedure: CERVICAL POLYPECTOMY/REMOVAL OF MASS;  Surgeon: Deaner Krystal, MD;  Location: [facility] [place];  Service: Gynecology;  Laterality: N/A;   CHOLECYSTECTOMY  08/2010   Dr Jeoffrey Dawn   CHOLESTEATOMA EXCISION  1985   COLONOSCOPY     CYSTOSCOPY  10/01/2011   Procedure: CYSTOSCOPY;  Surgeon: Krystal JONETTA  Meisinger, MD;  Location: [facility];  Service: Gynecology;  Laterality: N/A;   INNER EAR SURGERY Left age 41   KNEE SURGERY Left age 56   arthroscopic   LAPAROSCOPY  10/01/2011   Procedure: LAPAROSCOPY OPERATIVE;  Surgeon: Krystal JONETTA Deaner, MD;  Location: [facility];  Service: Gynecology;  Laterality: N/A;  removal of right fimbria, fulgeration of endometriosis   PERINEAL LACERATION REPAIR N/A 12/12/2020   Procedure: CERVICAL LACERATION REPAIR;  Surgeon: Sudie Lavonia HERO, MD;  Location: [facility];  Service: Gynecology;  Laterality: N/A;   Steel rod in her back  age 53   scoliosis   TONSILLECTOMY  as child   and adenoids    TUBAL LIGATION  april 31 2007   tubes in ears     childhood   uterine ablation  2008   WISDOM TOOTH EXTRACTION  2000    Allergies[1]  Outpatient Encounter Medications as of 07/23/2024  Medication Sig   acetaminophen  (TYLENOL ) 500 MG tablet Take 1,000 mg by mouth at bedtime as needed.   cetirizine (ZYRTEC) 10 MG tablet Take 10 mg by mouth at bedtime.   NON FORMULARY Take 1 Piece by mouth as needed. 1 CBD gummy for sleep as needed.   pantoprazole  (PROTONIX ) 40 MG tablet TAKE 1 TABLET DAILY   tirzepatide  (ZEPBOUND ) 12.5 MG/0.5ML Pen Inject 12.5 mg into the skin once a week.   [DISCONTINUED] sertraline  (ZOLOFT ) 50 MG tablet TAKE 1 TABLET DAILY   [DISCONTINUED] zolpidem  (AMBIEN ) 5 MG tablet TAKE 2 TABLETS BY MOUTH AT BEDTIME AS NEEDED FOR SLEEP   OVER THE COUNTER MEDICATION at bedtime. Rest Ez (Patient not taking: Reported on 07/23/2024)   sertraline  (ZOLOFT ) 50 MG tablet Take 1.5 tablets (75 mg total) by mouth daily.   [DISCONTINUED] sertraline  (ZOLOFT ) 50 MG tablet Take 1.5 tablets (75 mg total) by mouth daily.   No facility-administered encounter medications on file as of 07/23/2024.    Review of Systems:  Review of Systems  Constitutional:  Negative for appetite change, chills, fatigue and fever.  HENT:  Negative for congestion, hearing loss, rhinorrhea and sore throat.   Eyes: Negative.   Respiratory:  Negative for cough, shortness of breath and wheezing.   Cardiovascular:  Negative for chest pain, palpitations and leg swelling.  Gastrointestinal:  Negative for abdominal pain, constipation, diarrhea, nausea and vomiting.  Genitourinary:  Negative for dysuria.  Musculoskeletal:  Negative for arthralgias, back pain and myalgias.  Skin:  Negative for color change, rash and wound.  Neurological:  Negative for dizziness, weakness and headaches.  Psychiatric/Behavioral:  Positive for sleep disturbance. Negative for behavioral problems. The patient is not nervous/anxious.     Health  Maintenance  Topic Date Due   DTaP/Tdap/Td (2 - Td or Tdap) 10/12/2024 (Originally 09/10/2019)   Influenza Vaccine  10/30/2024 (Originally 03/02/2024)   Hepatitis B Vaccines 19-59 Average Risk (1 of 3 - 19+ 3-dose series) 07/23/2025 (Originally 02/22/1994)   Cervical Cancer Screening (HPV/Pap Cotest)  10/22/2025   Mammogram  01/10/2026   Colonoscopy  03/23/2029   Hepatitis C Screening  Completed   HIV Screening  Completed   HPV VACCINES  Aged Out   Meningococcal B Vaccine  Aged Out   Pneumococcal Vaccine  Discontinued   COVID-19 Vaccine  Discontinued    Physical Exam: Vitals:   07/23/24 0851  BP: 112/62  Pulse: 71  Temp: 97.7 F (36.5 C)  TempSrc: Temporal  SpO2: 98%  Weight: 232 lb (105.2 kg)  Height: 5' 2 (1.575 m)  Body mass index is 42.43 kg/m. Physical Exam Constitutional:      Appearance: Normal appearance.  HENT:     Head: Normocephalic and atraumatic.     Nose: Nose normal.     Mouth/Throat:     Mouth: Mucous membranes are moist.  Eyes:     Conjunctiva/sclera: Conjunctivae normal.  Cardiovascular:     Rate and Rhythm: Normal rate and regular rhythm.  Pulmonary:     Effort: Pulmonary effort is normal.     Breath sounds: Normal breath sounds.  Abdominal:     General: Bowel sounds are normal.     Palpations: Abdomen is soft.  Musculoskeletal:        General: Normal range of motion.     Cervical back: Normal range of motion.  Skin:    General: Skin is warm and dry.  Neurological:     General: No focal deficit present.     Mental Status: She is alert and oriented to person, place, and time.  Psychiatric:        Mood and Affect: Mood normal.        Behavior: Behavior normal.        Thought Content: Thought content normal.        Judgment: Judgment normal.     Labs reviewed: Basic Metabolic Panel: Recent Labs    01/23/24 0907  NA 139  K 4.0  CL 105  CO2 26  GLUCOSE 84  BUN 13  CREATININE 1.01*  CALCIUM  8.8   Liver Function Tests: Recent  Labs    01/23/24 0907  AST 18  ALT 11  BILITOT 0.5  PROT 7.4   No results for input(s): LIPASE, AMYLASE in the last 8760 hours. No results for input(s): AMMONIA in the last 8760 hours. CBC: Recent Labs    01/23/24 0907  WBC 3.7*  NEUTROABS 2,224  HGB 13.6  HCT 43.4  MCV 90.8  PLT 106*   Lipid Panel: Recent Labs    01/23/24 0907  CHOL 185  HDL 49*  LDLCALC 113*  TRIG 121  CHOLHDL 3.8   Lab Results  Component Value Date   HGBA1C 5.4 01/23/2024    Procedures since last visit: No results found.  Assessment/Plan  1. OSA (obstructive sleep apnea) (Primary) -  Managed with CPAP therapy. - Continue CPAP therapy. - Follow up with sleep specialist at the beginning of the year.  2. Gastroesophageal reflux disease without esophagitis -  Avoids trigger foods. - Continue Pantoprazole  40 mg daily. - Maintain dietary modifications to avoid trigger foods.  3. Moderate major depression (HCC) -  Moderate depression with increased anxiety and difficulty focusing. PHQ score 14. Missed Zoloft  doses increase irritability. - Increased Zoloft  to 75 mg daily. - sertraline  (ZOLOFT ) 50 MG tablet; Take 1.5 tablets (75 mg total) by mouth daily.  Dispense: 135 tablet; Refill: 1  4. Elevated LDL cholesterol level -  Slightly elevated LDL at 113 mg/dL six months ago. - Ordered repeat lipid panel. - Encouraged low-fat diet.  5. Primary insomnia -  with difficulty maintaining sleep. Sleeps approximately six hours per night. Aware of potential tolerance with Ambien . - Continue Ambien  5 mg, two tablets at bedtime as needed.  6. Morbidly obese (HCC) -  Weight loss of 25 pounds since March. Current weight 231 pounds. Goal to lose additional 20 pounds. - Continue current weight management plan. - Encouraged dietary modifications, including increased vegetable intake. - Encouraged regular exercise as tolerated. - Comprehensive metabolic panel  General health  maintenance Blood pressure well-controlled. Heart rate regular. No issues with prediabetes. Declined Hepatitis B and COVID vaccines. Plans for eye examination due to worsening vision. - Ordered metabolic panel - Encouraged regular eye examinations. - Continue low salt diet.        Labs/tests ordered:  CMP   Return in about 6 months (around 01/21/2025), or if symptoms worsen or fail to improve.  Jaimes Eckert Medina-Vargas, NP      [1] No Known Allergies  "

## 2024-07-23 NOTE — Telephone Encounter (Signed)
 Patient is requesting a refill of the following medications: Requested Prescriptions   Pending Prescriptions Disp Refills   zolpidem  (AMBIEN ) 5 MG tablet [Pharmacy Med Name: zolpidem  5 mg tablet] 60 tablet 0    Sig: TAKE 2 TABLETS BY MOUTH AT BEDTIME AS NEEDED FOR SLEEP    Date of last refill: 08/15/2023  Refill amount: 60 0 refills  Treatment agreement date: 10/12/2023

## 2024-07-24 ENCOUNTER — Ambulatory Visit: Payer: Self-pay | Admitting: Adult Health

## 2024-07-24 LAB — COMPREHENSIVE METABOLIC PANEL WITH GFR
AG Ratio: 1.1 (calc) (ref 1.0–2.5)
ALT: 17 U/L (ref 6–29)
AST: 20 U/L (ref 10–35)
Albumin: 3.9 g/dL (ref 3.6–5.1)
Alkaline phosphatase (APISO): 75 U/L (ref 31–125)
BUN: 12 mg/dL (ref 7–25)
CO2: 29 mmol/L (ref 20–32)
Calcium: 9 mg/dL (ref 8.6–10.2)
Chloride: 107 mmol/L (ref 98–110)
Creat: 0.91 mg/dL (ref 0.50–0.99)
Globulin: 3.6 g/dL (ref 1.9–3.7)
Glucose, Bld: 89 mg/dL (ref 65–99)
Potassium: 4.7 mmol/L (ref 3.5–5.3)
Sodium: 143 mmol/L (ref 135–146)
Total Bilirubin: 0.5 mg/dL (ref 0.2–1.2)
Total Protein: 7.5 g/dL (ref 6.1–8.1)
eGFR: 77 mL/min/1.73m2

## 2024-07-24 NOTE — Progress Notes (Signed)
 Electrolytes, liver enzymes normal

## 2024-08-22 NOTE — Progress Notes (Unsigned)
 " [facility] [address] [place]. [place] [address] [phone]       OFFICE FOLLOW UP NOTE  [name] Date of Birth:  April 19, 1976 Medical Record Number:  [id number]    Primary neurologist: Dr. Buck Reason for visit: CPAP follow-up    SUBJECTIVE:   CHIEF COMPLAINT:  Chief Complaint  Patient presents with   Follow-up    Pt in 3 alone  Pt here for cpap f/u Pt states dry mouth in am     Follow-up visit:  Prior visit: 08/25/2023 with Dr. Buck  Brief HPI:   Meghan Wilcox is a 49 y.o. female who is followed for OSA on CPAP. She had a baseline sleep study through our sleep lab on 05/31/2023 which showed severe obstructive sleep apnea with an AHI of 34.3/h, REM AHI of 51/h, supine AHI of 38.1/h and O2 nadir of 70% with significant time below or at 88% saturation of nearly 1 hour for the night.  CPAP set up 06/2023.    Interval history:  Patient returns for yearly follow-up visit.  Overall doing well on CPAP therapy.  She notes continued benefit.  She does complain of dry mouth upon awakening. Using evora FFM.  Notes humidification set on auto.  Otherwise tolerating well.  Routinely follows with DME and up-to-date on supplies.  ESS 7/24.  No further questions or concerns at this time.        ROS:   14 system review of systems performed and negative with exception of those listed in HPI  PMH:  Past Medical History:  Diagnosis Date   Allergy     seasonal   Blood transfusion age 40yrs   Antibody scewwn neg. 08/20/10 prior to cholecystectomy   Blood transfusion without reported diagnosis    Cancer (HCC) 2018   2 spots melanoma removed under left breast   Cervical mass    Chronic back pain    Diverticulitis    Family history of malignant neoplasm of gastrointestinal tract    Fatty liver    Gastroparesis    GERD (gastroesophageal reflux disease)    Hepatomegaly    History of ovarian cyst    Scoliosis    Seizures (HCC)    post  partum  eclampsia  22 yrs ago   Sleep apnea     PSH:  Past Surgical History:  Procedure Laterality Date   BLADDER SUSPENSION  10/01/2011   Procedure: TRANSVAGINAL TAPE (TVT) PROCEDURE;  Surgeon: Krystal JONETTA Deaner, MD;  Location: [facility];  Service: Gynecology;  Laterality: N/A;  Solyx Sling   CERVICAL POLYPECTOMY N/A 11/21/2020   Procedure: CERVICAL POLYPECTOMY/REMOVAL OF MASS;  Surgeon: Deaner Krystal, MD;  Location: [facility];  Service: Gynecology;  Laterality: N/A;   CHOLECYSTECTOMY  08/2010   Dr Jeoffrey Dawn   CHOLESTEATOMA EXCISION  1985   COLONOSCOPY     CYSTOSCOPY  10/01/2011   Procedure: CYSTOSCOPY;  Surgeon: Krystal JONETTA Deaner, MD;  Location: [facility];  Service: Gynecology;  Laterality: N/A;   INNER EAR SURGERY Left age 39   KNEE SURGERY Left age 12   arthroscopic   LAPAROSCOPY  10/01/2011   Procedure: LAPAROSCOPY OPERATIVE;  Surgeon: Krystal JONETTA Deaner, MD;  Location: [facility];  Service: Gynecology;  Laterality: N/A;  removal of right fimbria, fulgeration of endometriosis   PERINEAL LACERATION REPAIR N/A 12/12/2020   Procedure: CERVICAL LACERATION REPAIR;  Surgeon: Sudie Lavonia HERO, MD;  Location: [facility];  Service: Gynecology;  Laterality: N/A;  Steel rod in her back  age 63   scoliosis   TONSILLECTOMY  as child   and adenoids   TUBAL LIGATION  april 31 2007   tubes in ears     childhood   uterine ablation  2008   WISDOM TOOTH EXTRACTION  2000    Social History:  Social History   Socioeconomic History   Marital status: Married    Spouse name: Not on file   Number of children: 3   Years of education: Not on file   Highest education level: 12th grade  Occupational History   Occupation: ADMIN    Employer: [employer]  Tobacco Use   Smoking status: Former    Current packs/day: 0.00    Average packs/day: 1 pack/day for 30.0 years (30.0 ttl pk-yrs)    Types: Cigarettes    Start date: 01/30/1990    Quit date: 01/31/2020    Years since quitting: 4.5    Smokeless tobacco: Never  Vaping Use   Vaping status: Every Day   Substances: Nicotine , Flavoring  Substance and Sexual Activity   Alcohol use: Yes    Comment: special occasions   Drug use: No   Sexual activity: Yes    Birth control/protection: Surgical  Other Topics Concern   Not on file  Social History Narrative   Caffeine: rarely   Lives at home with husband and child   Right handed   Pt works    Social Drivers of Health   Tobacco Use: Medium Risk (08/23/2024)   Patient History    Smoking Tobacco Use: Former    Smokeless Tobacco Use: Never    Passive Exposure: Not on file  Financial Resource Strain: Medium Risk (01/20/2024)   Overall Financial Resource Strain (CARDIA)    Difficulty of Paying Living Expenses: Somewhat hard  Food Insecurity: No Food Insecurity (01/20/2024)   Epic    Worried About Radiation Protection Practitioner of Food in the Last Year: Never true    Ran Out of Food in the Last Year: Never true  Transportation Needs: No Transportation Needs (01/20/2024)   Epic    Lack of Transportation (Medical): No    Lack of Transportation (Non-Medical): No  Physical Activity: Insufficiently Active (01/20/2024)   Exercise Vital Sign    Days of Exercise per Week: 3 days    Minutes of Exercise per Session: 30 min  Stress: Stress Concern Present (01/20/2024)   [employer] of Occupational Health - Occupational Stress Questionnaire    Feeling of Stress: To some extent  Social Connections: Moderately Isolated (01/20/2024)   Social Connection and Isolation Panel    Frequency of Communication with Friends and Family: More than three times a week    Frequency of Social Gatherings with Friends and Family: Once a week    Attends Religious Services: Never    Database Administrator or Organizations: No    Attends Engineer, Structural: Not on file    Marital Status: Married  Catering Manager Violence: Not At Risk (05/23/2023)   Received from [facility]   HITS    Over the last 12  months how often did your partner physically hurt you?: Never    Over the last 12 months how often did your partner insult you or talk down to you?: Never    Over the last 12 months how often did your partner threaten you with physical harm?: Never    Over the last 12 months how often did your partner scream or  curse at you?: Never  Depression (PHQ2-9): High Risk (07/23/2024)   Depression (PHQ2-9)    PHQ-2 Score: 14  Alcohol Screen: Low Risk (01/20/2024)   Alcohol Screen    Last Alcohol Screening Score (AUDIT): 3  Housing: Low Risk (01/20/2024)   Epic    Unable to Pay for Housing in the Last Year: No    Number of Times Moved in the Last Year: 0    Homeless in the Last Year: No  Utilities: Not on file  Health Literacy: Low Risk (09/29/2022)   Received from [facility]   Health Literacy    How often do you have a problem understanding what is told to you about your medical condition? : Never    Family History:  Family History  Problem Relation Age of Onset   Diabetes Mother    Cirrhosis Mother    Irritable bowel syndrome Mother    Crohn's disease Mother    Lung cancer Mother    Heart disease Mother    Sleep apnea Mother    Sleep apnea Father    Diabetes Father    Heart disease Father    Liver disease Father    Cirrhosis Father    Diabetes Brother    Colon cancer Maternal Uncle    Colon cancer Paternal Grandfather 17   Colon cancer Other    Rectal cancer Other    Esophageal cancer Neg Hx    Stomach cancer Neg Hx    Colon polyps Neg Hx     Medications:  Medications Ordered Prior to Encounter[1]  Allergies:  Allergies[2]    OBJECTIVE:  Physical Exam  Vitals:   08/23/24 0750  BP: 102/64  Pulse: 65  Weight: 231 lb 3.2 oz (104.9 kg)  Height: 5' 3 (1.6 m)   Body mass index is 40.96 kg/m. No results found.   General: well developed, well nourished, very pleasant middle-age Caucasian female, seated, in no evident distress Head: head  normocephalic and atraumatic.   Neck: supple with no carotid or supraclavicular bruits Cardiovascular: regular rate and rhythm, no murmurs  Neurologic Exam Mental Status: Awake and fully alert. Oriented to place and time. Recent and remote memory intact. Attention span, concentration and fund of knowledge appropriate. Mood and affect appropriate.  Cranial Nerves: Pupils equal, briskly reactive to light. Extraocular movements full without nystagmus. Visual fields full to confrontation. Hearing intact. Facial sensation intact. Face, tongue, palate moves normally and symmetrically.  Motor: Normal bulk and tone. Normal strength in all tested extremity muscles Gait and Station: Arises from chair without difficulty. Stance is normal. Gait demonstrates normal stride length and balance without use of AD.         ASSESSMENT/PLAN: Meghan Wilcox is a 49 y.o. year old female    OSA on CPAP :  Compliance report shows satisfactory usage with optimal residual AHI.   Continue current pressure settings 7-13 with EPR 3 Discussed ways to help with dry mouth such as adjustments to humidification (can set machine lower than HOB if increased humidification helps with dry mouth but has increased condensation in tubing), use of Biotene and use external humidifier Discussed continued nightly usage with ensuring greater than 4 hours nightly for optimal benefit and per insurance purposes.   Continue to follow with DME company for any needed supplies or CPAP related concerns CPAP set up 06/2023     Follow up in 1 year via MyChart video visit or call earlier if needed   CC:  PCP: Phyllis Jereld BROCKS, NP    I personally spent a total of 25 minutes in the care of the patient today including preparing to see the patient, getting/reviewing separately obtained history, performing a medically appropriate exam/evaluation, counseling and educating, placing orders, and documenting clinical information in the  EHR. This is our first time meeting and time has been spent reviewing past medical history and relevant medical records.   Harlene Bogaert, AGNP-BC  [facility] [address] [place], [place] [address]  Phone [phone] Fax [phone] Note: This document was prepared with digital dictation and possible smart phrase technology. Any transcriptional errors that result from this process are unintentional.           [1]  Current Outpatient Medications on File Prior to Visit  Medication Sig Dispense Refill   acetaminophen  (TYLENOL ) 500 MG tablet Take 1,000 mg by mouth at bedtime as needed.     cetirizine (ZYRTEC) 10 MG tablet Take 10 mg by mouth at bedtime.     NON FORMULARY Take 1 Piece by mouth as needed. 1 CBD gummy for sleep as needed.     pantoprazole  (PROTONIX ) 40 MG tablet TAKE 1 TABLET DAILY 90 tablet 3   sertraline  (ZOLOFT ) 50 MG tablet Take 1.5 tablets (75 mg total) by mouth daily. 135 tablet 1   tirzepatide  (ZEPBOUND ) 12.5 MG/0.5ML Pen Inject 12.5 mg into the skin once a week. 6 mL 1   zolpidem  (AMBIEN ) 5 MG tablet Take 2 tablets (10 mg total) by mouth at bedtime as needed for sleep. 60 tablet 0   OVER THE COUNTER MEDICATION at bedtime. Rest Ez (Patient not taking: Reported on 07/23/2024)     No current facility-administered medications on file prior to visit.  [2] No Known Allergies  "

## 2024-08-23 ENCOUNTER — Ambulatory Visit (INDEPENDENT_AMBULATORY_CARE_PROVIDER_SITE_OTHER): Payer: BC Managed Care – PPO | Admitting: Adult Health

## 2024-08-23 ENCOUNTER — Encounter: Payer: Self-pay | Admitting: Adult Health

## 2024-08-23 VITALS — BP 102/64 | HR 65 | Ht 63.0 in | Wt 231.2 lb

## 2024-08-23 DIAGNOSIS — G4733 Obstructive sleep apnea (adult) (pediatric): Secondary | ICD-10-CM | POA: Diagnosis not present

## 2024-08-23 NOTE — Patient Instructions (Addendum)
 Your Plan:  Continue nightly use of CPAP for adequate sleep apnea management   Continue to follow with your DME for any needed supplies or CPAP related concerns     Follow up in 1 year or call earlier if needed     Thank you for coming to see [place] at [facility]. I hope we have been able to provide you high quality care today.  You may receive a patient satisfaction survey over the next few weeks. We would appreciate your feedback and comments so that we may continue to improve ourselves and the health of our patients.

## 2024-08-23 NOTE — Progress Notes (Signed)
 SABRA

## 2024-08-24 ENCOUNTER — Other Ambulatory Visit: Payer: Self-pay | Admitting: Adult Health

## 2024-08-24 DIAGNOSIS — G4700 Insomnia, unspecified: Secondary | ICD-10-CM

## 2024-08-24 NOTE — Telephone Encounter (Signed)
 Patient is requesting a refill of the following medications: Requested Prescriptions   Pending Prescriptions Disp Refills   zolpidem  (AMBIEN ) 5 MG tablet [Pharmacy Med Name: zolpidem  5 mg tablet] 60 tablet 0    Sig: TAKE 2 TABLETS BY MOUTH AT BEDTIME AS NEEDED FOR SLEEP    Date of last refill: 07/23/2024  Refill amount: 60  Treatment agreement date: 10/2023

## 2025-01-28 ENCOUNTER — Ambulatory Visit: Admitting: Adult Health

## 2025-08-29 ENCOUNTER — Telehealth: Admitting: Adult Health
